# Patient Record
Sex: Male | Born: 1990 | Race: Black or African American | Hispanic: No | Marital: Single | Smoking: Never smoker
Health system: Southern US, Community
[De-identification: ages and names within clinical notes are randomized; demographics above are authoritative.]

## PROBLEM LIST (undated history)

## (undated) DIAGNOSIS — Z21 Asymptomatic human immunodeficiency virus [HIV] infection status: Secondary | ICD-10-CM

## (undated) DIAGNOSIS — F32A Depression, unspecified: Secondary | ICD-10-CM

## (undated) DIAGNOSIS — R41840 Attention and concentration deficit: Secondary | ICD-10-CM

## (undated) DIAGNOSIS — B20 Human immunodeficiency virus [HIV] disease: Secondary | ICD-10-CM

## (undated) DIAGNOSIS — F329 Major depressive disorder, single episode, unspecified: Secondary | ICD-10-CM

## (undated) DIAGNOSIS — A630 Anogenital (venereal) warts: Secondary | ICD-10-CM

## (undated) HISTORY — DX: Anogenital (venereal) warts: A63.0

---
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
## 2010-10-12 ENCOUNTER — Encounter (INDEPENDENT_AMBULATORY_CARE_PROVIDER_SITE_OTHER): Payer: Self-pay | Admitting: General Surgery

## 2010-10-12 ENCOUNTER — Ambulatory Visit (INDEPENDENT_AMBULATORY_CARE_PROVIDER_SITE_OTHER): Payer: BC Managed Care – PPO | Admitting: General Surgery

## 2010-10-12 VITALS — BP 118/76 | HR 80 | Ht 70.0 in | Wt 147.0 lb

## 2010-10-12 DIAGNOSIS — A630 Anogenital (venereal) warts: Secondary | ICD-10-CM | POA: Insufficient documentation

## 2010-10-12 MED ORDER — IMIQUIMOD 5 % EX CREA: TOPICAL_CREAM | CUTANEOUS | Status: DC

## 2010-10-12 NOTE — Progress Notes (Signed)
Chief Complaint  Patient presents with  . Hemorrhoids    HPI Stephen Lee is a 20 y.o. male. HPI  80 year old African American male is self-referred himself for a bleeding perirectal skin tag. He thinks he may have hemorrhoids. He states that he started having problems about a month ago. He describes pain with defecation. The discomfort will last for several hours. It does not feel as if he is passing shards of glass. He will take a hot shower after having a bowel movement which will help to relieve the discomfort. He states that he will have intermittent bleeding. He states that he has a bowel movement every day. He denies having to strain. He denies being a bathroom reader. He denies any incontinence. He states it feels like it will swell up intermittently.  He states that he was tested for HIV and it was negative. He says that he has not been sexually active for many months. He describes himself as bisexual. He denies any history of sexually transmitted diseases.  History reviewed. No pertinent past medical history.  History reviewed. No pertinent past surgical history.  History reviewed. No pertinent family history.  Social History History  Substance Use Topics  . Smoking status: Never Smoker   . Smokeless tobacco: Not on file  . Alcohol Use: No    No Known Allergies  Current Outpatient Prescriptions  Medication Sig Dispense Refill  . imiquimod (ALDARA) 5 % cream Apply topically 3 (three) times a week. Apply to perineum Monday/Wednesday/Friday nights, leave on overnight and watch off in the morning  12 each  3    Review of Systems Review of Systems  Constitutional: Negative for fever, activity change and unexpected weight change.  HENT: Negative for neck pain and neck stiffness.   Eyes: Negative for pain.  Respiratory: Negative for shortness of breath and wheezing.   Cardiovascular: Negative for chest pain.  Gastrointestinal: Negative for abdominal distention.    Genitourinary: Negative for dysuria, hematuria, discharge, difficulty urinating and genital sores.  Musculoskeletal: Negative for back pain and joint swelling.  Skin: Negative.   Neurological: Negative.   Hematological: Negative.   Psychiatric/Behavioral: Negative.     Blood pressure 118/76, pulse 80, height 5\' 10"  (1.778 m), weight 147 lb (66.679 kg).  Physical Exam Physical Exam  Vitals reviewed. Constitutional: He is oriented to person, place, and time. He appears well-developed and well-nourished.  HENT:  Head: Normocephalic and atraumatic.  Eyes: Conjunctivae are normal. No scleral icterus.  Neck: Normal range of motion. Neck supple. No thyromegaly present.  Cardiovascular: Normal rate, regular rhythm and normal heart sounds.   Pulmonary/Chest: Effort normal and breath sounds normal. He has no wheezes.  Abdominal: Soft. Bowel sounds are normal. He exhibits no distension. There is no tenderness.  Genitourinary: Rectal exam shows no external hemorrhoid, no fissure and anal tone normal.       +anal warts, mainly left sided, large, about 4cm long; +anal canal warts on anoscopy - circumferential  Musculoskeletal: Normal range of motion.  Lymphadenopathy:    He has no cervical adenopathy.  Neurological: He is alert and oriented to person, place, and time. He exhibits normal muscle tone.  Skin: Skin is warm and dry.  Psychiatric: He has a normal mood and affect. His behavior is normal. Judgment and thought content normal.    Data Reviewed  Assessment    Condyloma acuminata- perianal & anal canal    Plan    We discussed the etiology of his anal warts. I discussed  the nature of the HPV virus. The patient was given Agricultural engineer. We discussed both nonoperative and operative treatment. My recommendation was to start with topical treatment first in order to hopefully decrease his burden of anal warts. We are going to use Aldara 5% ointment 3 times a week at night for 6  weeks. I will see him back in 6 weeks to see his response to be Aldara ointment. Regardless he will more than likely need to go to the operating room for an exam under anesthesia to manage the anal canal warts.     Mary Sella. Andrey Campanile, MD   Atilano Ina 10/12/2010, 9:26 AM

## 2010-10-12 NOTE — Patient Instructions (Signed)
Warts Genital warts are caused by a virus called Human Papilloma Virus (HPV). HPV is the most common sexually transmitted disease, STD, and infection of the sex organs. There are about 100 different types of the HPV's and 4 of these can cause cancer of the cervix. Ninety percent of genital warts are caused by the HPV.  CAUSES This infection is spread by unprotected sex with an infected person. It can be spread by vaginal, anal, and oral sex. Many people will not know they are infected. They may be infected for years with little or no problems (symptoms). They can still unknowingly pass the infection to sexual partners. The most serious problem is that certain types of HPV is associated with cervical cancer but not genital warts. You are more likely to have HPV if you have other sexually transmitted disease. SYMPTOMS  Itching and irritation in the genital area.   Warts that bleed.   Painful sexual intercourse due to warts.  DIAGNOSIS  Warts are usually recognized with the naked eye in the vagina, on the vulva, the perineum, the anus and in the rectum.   With a Pap test.   With a biopsy.   With colposcopy (an instrument that magnifies the HPV cells that change color with certain solutions).  TREATMENT Warts can be removed by applying certain chemicals to the warts such as:  Podophllin.   Bichloroacedic acid.   Trichloroacedic acid.  Other treatments include:  Interferon injections.   Freezing (cryotherapy).   Laser treatment.   Removal of warts by burning them with electrified probe (electrocautery).   Surgery.  PREVENTION HPV vaccination can help prevent HPV infections that cause genital warts and that cause cancer of the cervix. It is recommended that the vaccination be given to people between the ages 76 to 58 years old. The vaccine might not work as well or might not work at all if you already have HPV. It should not be given to pregnant women. HOME CARE INSTRUCTIONS  It  is important to follow your caregiver's instructions. The warts will not go away without treatment. Repeat treatments often are needed to get rid of the warts. Even after it appears that the warts are gone, the normal tissue underneath often remains infected.   DO NOT try to treat genital warts with medicine used to treat hand warts. This type of medicine is strong and can burn the skin in the genital area, causing more damage.   WARNING: This infection is contagious. Tell your sexual partner(s) with whom you had sex before treatment that you have genital warts. They may be infected also and need treatment.   WARNING: Avoid sexual contact while being treated. Following treatment the use of condoms will help prevent reinfection. This virus is hard to wipe out. You may always be contagious.   DO NOT touch or scratch the warts. This is a viral infection and may spread to other parts of your body (auto-inoculation).   Women with genital warts should have a cervical cancer check (pap smear) at least once a year. This type of cancer is slow-growing and can be cured if found early. Chances of developing cervical cancer are increased with HPV.   Inform your obstetrician in the event of pregnancy. This virus can be passed to the baby's respiratory tract. Discuss this with your caregiver.   Use a condom during sexual intercourse.   There are over-the-counter anti-itch creams you can use for itching, ask your caregiver before using it.   Ask  your caregiver about HPV vaccination.   Have only one sex partner. Make sure your partner has only one sex partner.  SEEK MEDICAL CARE IF:  The treated skin becomes red, swollen, or painful.   An oral temperature above 102 or higher develops.   You feel generally ill.   You feel little lumps (pimple-like) in and around your genital area.   You are bleeding or have painful sexual intercourse.  MAKE SURE YOU:   Understand these instructions.   Will watch  your condition.   Will get help right away if you are not doing well or get worse.  Document Released: 01/27/2000 Document Re-Released: 07/19/2009 [facility] Patient Information 2011 [place], [place].

## 2010-10-31 ENCOUNTER — Inpatient Hospital Stay (INDEPENDENT_AMBULATORY_CARE_PROVIDER_SITE_OTHER)
Admission: RE | Admit: 2010-10-31 | Discharge: 2010-10-31 | Disposition: A | Discharge status: Home / Self Care | Payer: Self-pay | Source: Ambulatory Visit | Attending: Family Medicine | Admitting: Family Medicine

## 2010-10-31 DIAGNOSIS — I889 Nonspecific lymphadenitis, unspecified: Secondary | ICD-10-CM

## 2010-10-31 LAB — HIV ANTIBODY (ROUTINE TESTING W REFLEX): HIV: NONREACTIVE

## 2010-11-01 LAB — RPR: RPR Ser Ql: NONREACTIVE

## 2010-11-10 ENCOUNTER — Encounter (INDEPENDENT_AMBULATORY_CARE_PROVIDER_SITE_OTHER): Payer: Self-pay | Admitting: General Surgery

## 2010-11-10 ENCOUNTER — Ambulatory Visit (INDEPENDENT_AMBULATORY_CARE_PROVIDER_SITE_OTHER): Payer: BC Managed Care – PPO | Admitting: General Surgery

## 2010-11-10 VITALS — BP 122/82 | HR 72 | Temp 98.1°F | Resp 16 | Ht 70.0 in | Wt 146.6 lb

## 2010-11-10 DIAGNOSIS — A630 Anogenital (venereal) warts: Secondary | ICD-10-CM

## 2010-11-10 NOTE — Patient Instructions (Addendum)
No aspirin or blood thinning products 5 days prior to surgery. Use one fleets enema per rectum the morning of surgery at least 2 hours prior to leaving the house.  

## 2010-11-10 NOTE — Progress Notes (Signed)
Chief Complaint  Patient presents with  . Follow-up    reck condyloma    HPI Stephen Lee is a 20 y.o. male. HPI  44 year old [place] American male who I initially saw on 10/12/10.  He was found to have anal canal & left sided perianal warts.  We instituted Aldara ointment to try to decrease his burden of warts. He comes in today for a recheck.  He states he really no longer has discomfort in his rectal area after he took an antibiotic for several lacerations he received on his extremities after falling down.  He only used the Aldara for about 3 weeks. He desires surgical removal of his warts.  He states that he was tested for HIV and it was negative. He says that he has not been sexually active for many months. He describes himself as bisexual. He denies any history of sexually transmitted diseases.  Past Medical History  Diagnosis Date  . Condyloma acuminatum in male     perianal & anal canal    History reviewed. No pertinent past surgical history.  History reviewed. No pertinent family history.  Social History History  Substance Use Topics  . Smoking status: Never Smoker   . Smokeless tobacco: Not on file  . Alcohol Use: No    No Known Allergies  No current outpatient prescriptions on file.    Review of Systems Review of Systems  Constitutional: Negative for fever, activity change and unexpected weight change.  HENT: Negative for neck pain and neck stiffness.   Eyes: Negative for pain.  Respiratory: Negative for shortness of breath and wheezing.   Cardiovascular: Negative for chest pain.  Gastrointestinal: Negative for abdominal distention.  Genitourinary: Negative for dysuria, hematuria, discharge, difficulty urinating and genital sores.  Musculoskeletal: Negative for back pain and joint swelling.  Skin: Negative.   Neurological: Negative.   Hematological: Negative.   Psychiatric/Behavioral: Negative.     Blood pressure 122/82, pulse 72, temperature 98.1 F  (36.7 C), temperature source Temporal, resp. rate 16, height 5\' 10"  (1.778 m), weight 146 lb 9.6 oz (66.497 kg).  Physical Exam Physical Exam  Vitals reviewed. Constitutional: He is oriented to person, place, and time. He appears well-developed and well-nourished.  HENT:  Head: Normocephalic and atraumatic.  Eyes: Conjunctivae are normal. No scleral icterus.  Neck: Normal range of motion. Neck supple. No thyromegaly present.  Cardiovascular: Normal rate, regular rhythm and normal heart sounds.   Pulmonary/Chest: Effort normal and breath sounds normal. He has no wheezes.  Abdominal: Soft. Bowel sounds are normal. He exhibits no distension. There is no tenderness.  Genitourinary: Rectal exam shows no external hemorrhoid, no fissure and anal tone normal.       +anal warts, mainly left sided, large, about 4cm long; +anal canal warts on anoscopy - circumferential  Musculoskeletal: Normal range of motion.  Lymphadenopathy:    He has no cervical adenopathy.  Neurological: He is alert and oriented to person, place, and time. He exhibits normal muscle tone.  Skin: Skin is warm and dry.  Psychiatric: He has a normal mood and affect. His behavior is normal. Judgment and thought content normal.    Data Reviewed My note from 10/12/10  Assessment    Condyloma acuminata- perianal & anal canal    Plan    The patient was given educational material. We discussed both nonoperative and operative treatment.  The patient would like to proceed to the operating room for excision and fulguration of his perianal and anal canal warts.  We discussed the risk and benefits of surgery including but not limited to bleeding, infection, urinary retention, anal canal narrowing, recurrence of warts, postoperative abscess, and blood clot formation, anesthesia risks, and the typical postoperative course. I explained that this is a very painful and uncomfortable procedure to recuperate from. I stressed to him the  importance of not becoming constipated after surgery. We also discussed using sitz baths, stool softeners, drinking plenty of liquids, taking laxatives as needed, etc.  We will schedule him for an excision and fulguration of perianal and anal canal warts in the near future  Salle Brandle M. Andrey Campanile, MD   Atilano Ina 11/10/2010, 12:33 PM

## 2010-11-17 ENCOUNTER — Encounter (INDEPENDENT_AMBULATORY_CARE_PROVIDER_SITE_OTHER): Payer: BC Managed Care – PPO | Admitting: General Surgery

## 2010-11-20 ENCOUNTER — Other Ambulatory Visit (INDEPENDENT_AMBULATORY_CARE_PROVIDER_SITE_OTHER): Payer: Self-pay | Admitting: General Surgery

## 2010-11-20 ENCOUNTER — Encounter (HOSPITAL_COMMUNITY): Payer: BC Managed Care – PPO

## 2010-11-20 LAB — DIFFERENTIAL
Basophils Absolute: 0 10*3/uL (ref 0.0–0.1)
Basophils Relative: 0 % (ref 0–1)
Eosinophils Absolute: 0.1 10*3/uL (ref 0.0–0.7)
Eosinophils Relative: 2 % (ref 0–5)
Lymphocytes Relative: 42 % (ref 12–46)
Lymphs Abs: 3.1 10*3/uL (ref 0.7–4.0)
Monocytes Absolute: 0.6 10*3/uL (ref 0.1–1.0)
Monocytes Relative: 8 % (ref 3–12)
Neutro Abs: 3.5 10*3/uL (ref 1.7–7.7)
Neutrophils Relative %: 48 % (ref 43–77)

## 2010-11-20 LAB — CBC
HCT: 41.6 % (ref 39.0–52.0)
Hemoglobin: 14 g/dL (ref 13.0–17.0)
MCH: 29.4 pg (ref 26.0–34.0)
MCHC: 33.7 g/dL (ref 30.0–36.0)
MCV: 87.2 fL (ref 78.0–100.0)
Platelets: 273 10*3/uL (ref 150–400)
RBC: 4.77 MIL/uL (ref 4.22–5.81)
RDW: 11.9 % (ref 11.5–15.5)
WBC: 7.3 10*3/uL (ref 4.0–10.5)

## 2010-11-20 LAB — SURGICAL PCR SCREEN
MRSA, PCR: NEGATIVE
Staphylococcus aureus: NEGATIVE

## 2010-11-21 ENCOUNTER — Other Ambulatory Visit (INDEPENDENT_AMBULATORY_CARE_PROVIDER_SITE_OTHER): Payer: Self-pay | Admitting: General Surgery

## 2010-11-21 ENCOUNTER — Ambulatory Visit (HOSPITAL_COMMUNITY)
Admission: RE | Admit: 2010-11-21 | Discharge: 2010-11-21 | Disposition: A | Discharge status: Home / Self Care | Payer: BC Managed Care – PPO | Source: Ambulatory Visit | Attending: General Surgery | Admitting: General Surgery

## 2010-11-21 DIAGNOSIS — Z01812 Encounter for preprocedural laboratory examination: Secondary | ICD-10-CM | POA: Insufficient documentation

## 2010-11-21 DIAGNOSIS — A630 Anogenital (venereal) warts: Secondary | ICD-10-CM | POA: Insufficient documentation

## 2010-11-21 DIAGNOSIS — K6282 Dysplasia of anus: Secondary | ICD-10-CM | POA: Insufficient documentation

## 2010-11-28 NOTE — Op Note (Signed)
Stephen Lee, Stephen Lee               ACCOUNT NO.:  [id number]  MEDICAL RECORD NO.:  [id number]  LOCATION:  DAYL                         FACILITY:  [facility]  PHYSICIAN:  Mary Sella. Andrey Campanile, MD     DATE OF BIRTH:  1990/03/17  DATE OF PROCEDURE:  11/21/2010 DATE OF DISCHARGE:  11/21/2010                              OPERATIVE REPORT   PREOPERATIVE DIAGNOSIS:  Perianal and anal canal condyloma acuminata.  POSTOPERATIVE DIAGNOSIS:  Perianal and anal canal condyloma acuminata.  PROCEDURE:  Excision and fulguration of perianal and anal canal condyloma acuminata.  SURGEON:  Mary Sella. Andrey Campanile, MD  ASSISTANT SURGEON:  None.  ANESTHESIA:  General plus 15 cc of 0.25% Marcaine plus 30 cc of Exparel.  SPECIMEN: 1. Perianal warts. 2. Anal canal warts.  EBL:  Less than 30 cc.  FINDINGS:  The patient had mainly left-sided perianal warts in the posterolateral position that were on a fairly large stalk.  He had a few tiny scattered areas on the right side of his anoderm.  With respect to his anal canal, he had circumferential patchy anal canal warts.  There were normal areas of mucosa, but essentially areas of circumferential patchy pattern of anal canal warts.  INDICATIONS FOR PROCEDURE:  The patient is a 20 year old African- American male who has had several-month history of warts.  He stated that he has been tested for HIV and was negative.  We instituted Aldara ointment to try to decrease the burden of his perianal warts to minimize the amount of what we have to do at the time of surgery.  He came back for followup and his warts are not decreased.  The patient elected for surgical removal.  We discussed the risks and benefits at length including, but not limited to bleeding, infection, urinary retention, blood clot formation, recurrence of warts, severe postoperative pain, postoperative narrowing of the anal canal, and pelvic sepsis.  The patient elected to proceed to the operating  room.  DESCRIPTION OF PROCEDURE:  After obtaining informed consent, the patient was brought to the operating room.  Sequential compression devices were placed.  General endotracheal anesthesia was established.  He was then placed in the prone jackknife position with appropriate padding.  His buttocks were taped apart.  His perineum and anus were prepped with Betadine.  He received antibiotics prior to skin incision.  A surgical time-out was performed.  He had pedunculated left-sided perianal warts.  He had a few tiny 1 to 2- mm warts on the right side of his anoderm.  I injected local consisting of 0.25% Marcaine with epinephrine just underneath the mucosa to help with hemostasis.  I then lifted up on the warts and excised them sharply with a 15 blade.  I then cauterized the base of the wart stalk with Bovie electrocautery and then rubbed it off with a sponge and then re- cauterized the base.  This was done on the left side of the perianal area, and these warts were labeled as left-sided perianal warts.  This was done in its entirety for the perianal area.  Digital rectal exam was then performed and an anoscopy was then performed.  He had patchy anal canal warts.  I then injected 0.25% Marcaine with epinephrine directly underneath the warts into the mucosa to help with hemostasis.  I excised some with the Harmonic Scalpel to send to Pathology.  I then burned the base with electrocautery.  For the remaining circumferential patchy anal canal warts, I took the Bovie electrocautery and burned the warts until they have been vaporized and then taking a Ray-Tec, debrided it roughly with the Ray-Tec.  I then re-cauterized the base to help with hemostasis and this was done in a circumferential manner where there were obvious warts.  There were patchy areas of normal mucosa that were left unviolated.  There was no evidence of injury to the underlying muscular sphincters.  I then irrigated the  anal canal with diluted Betadine.  I then injected 30 cc of Exparel around the perianal area as well as regional block.  A piece of Gelfoam was inserted into the rectum followed by ABDs and fluffs on the perineum, and mesh underwear.  The patient was then placed in the supine position, extubated, and taken to Recovery in stable condition.  All needle, instrument, sponge counts were correct x2.  There were no immediate complications.  The patient tolerated the procedure well.     Mary Sella. Andrey Campanile, MD     EMW/MEDQ  D:  11/21/2010  T:  11/21/2010  Job:  [id number]  Electronically Signed by Gaynelle Adu M.D. on 11/28/2010 04:44:39 PM

## 2011-05-24 ENCOUNTER — Emergency Department (INDEPENDENT_AMBULATORY_CARE_PROVIDER_SITE_OTHER)
Admission: EM | Admit: 2011-05-24 | Discharge: 2011-05-24 | Disposition: A | Discharge status: Home / Self Care | Payer: Self-pay | Source: Home / Self Care | Attending: Emergency Medicine | Admitting: Emergency Medicine

## 2011-05-24 ENCOUNTER — Encounter (HOSPITAL_COMMUNITY): Payer: Self-pay | Admitting: Cardiology

## 2011-05-24 DIAGNOSIS — Z9189 Other specified personal risk factors, not elsewhere classified: Secondary | ICD-10-CM

## 2011-05-24 DIAGNOSIS — Z202 Contact with and (suspected) exposure to infections with a predominantly sexual mode of transmission: Secondary | ICD-10-CM

## 2011-05-24 HISTORY — DX: Major depressive disorder, single episode, unspecified: F32.9

## 2011-05-24 HISTORY — DX: Depression, unspecified: F32.A

## 2011-05-24 MED ORDER — AZITHROMYCIN 250 MG PO TABS
1000.0000 mg | ORAL_TABLET | Freq: Once | ORAL | Status: AC
  Administered 2011-05-24: 1000 mg via ORAL

## 2011-05-24 MED ORDER — CEFTRIAXONE SODIUM 250 MG IJ SOLR
INTRAMUSCULAR | Status: AC
  Filled 2011-05-24: qty 250

## 2011-05-24 MED ORDER — CEFTRIAXONE SODIUM 250 MG IJ SOLR
250.0000 mg | Freq: Once | INTRAMUSCULAR | Status: AC
  Administered 2011-05-24: 250 mg via INTRAMUSCULAR

## 2011-05-24 MED ORDER — AZITHROMYCIN 250 MG PO TABS
ORAL_TABLET | ORAL | Status: AC
  Filled 2011-05-24: qty 4

## 2011-05-24 NOTE — ED Notes (Signed)
Pt reports possible exposure to STD. Pt denies drainage from penis but has occasional pinch in his penis. Denies burning or urination. Denies fever.

## 2011-05-24 NOTE — ED Provider Notes (Signed)
History     CSN: [id number]  Arrival date & time 05/24/11  1191   First MD Initiated Contact with Patient 05/24/11 1054      Chief Complaint  Patient presents with  . SEXUALLY TRANSMITTED DISEASE    (Consider location/radiation/quality/duration/timing/severity/associated sxs/prior treatment) HPI Comments: Pt with sharp, intermittent pain in his penis after he urinates for several days and.  No urgency, frequency, dysuria, oderous urine, hematuria,  genital blisters, penile itching. No fevers, N/V, abd pain, back pain. No recent abx use. Pt sexually active with new male partner who is asxatic. does not use condoms. STD's a concern today. No h/o gonorrhea chlamydia trichmonoas. No h/o syphilis, herpes, HIV.    Patient is a 21 y.o. male presenting with STD exposure. The history is provided by the patient. No language interpreter was used.  Exposure to STD This is a new problem. The current episode started more than 2 days ago. Pertinent negatives include no abdominal pain.    Past Medical History  Diagnosis Date  . Condyloma acuminatum in male     perianal & anal canal  . Depression     History reviewed. No pertinent past surgical history.  History reviewed. No pertinent family history.  History  Substance Use Topics  . Smoking status: Never Smoker   . Smokeless tobacco: Not on file  . Alcohol Use: No      Review of Systems  Constitutional: Negative for fever.  HENT: Negative for sore throat.   Gastrointestinal: Negative for nausea, vomiting and abdominal pain.  Genitourinary: Negative for dysuria, urgency, frequency, hematuria, discharge, penile swelling, scrotal swelling, genital sores, penile pain and testicular pain.  Musculoskeletal: Negative for arthralgias.  Skin: Negative for rash.  Neurological: Negative for weakness.    Allergies  Review of patient's allergies indicates no known allergies.  Home Medications   Current Outpatient Rx  Name Route Sig  Dispense Refill  . ARIPIPRAZOLE 5 MG PO TABS Oral Take 5 mg by mouth daily.    Marland Kitchen PRESCRIPTION MEDICATION  Medication of depression. Unsure of name      BP 117/75  Pulse 80  Temp(Src) 97.8 F (36.6 C) (Oral)  Resp 14  SpO2 99%  Physical Exam  Nursing note and vitals reviewed. Constitutional: He is oriented to person, place, and time. He appears well-developed and well-nourished.  HENT:  Head: Normocephalic and atraumatic.  Eyes: Conjunctivae and EOM are normal.  Neck: Normal range of motion.  Cardiovascular: Regular rhythm.   Pulmonary/Chest: Effort normal. No respiratory distress.  Abdominal: He exhibits no distension.  Genitourinary: Testes normal and penis normal. Circumcised. No penile erythema or penile tenderness. No discharge found.       no d/c. no rash. Pt declined chaperone.   Musculoskeletal: Normal range of motion. He exhibits no edema and no tenderness.  Lymphadenopathy:       Right: No inguinal adenopathy present.       Left: No inguinal adenopathy present.  Neurological: He is alert and oriented to person, place, and time.  Skin: Skin is warm and dry. No rash noted.  Psychiatric: He has a normal mood and affect. His behavior is normal.    ED Course  Procedures (including critical care time)  Labs Reviewed - No data to display No results found.   1. Potential exposure to STD     MDM  Previous records reviewed. HIV, RPR neg 10/2010.   H&P most c/w STD exposure. Exam unremarkable.. Sent off GC/chlamydia. . Will treat empirically now.  Giving ceftriaxone 250 mg IM/azithro 1 gm po.  Advised pt to refrain from sexual contact until he knows lab results, symptoms resolve, and partner(s) are treated. Pt provided working phone number. Pt agrees.     Luiz Blare, MD 05/24/11 1236

## 2011-05-24 NOTE — Discharge Instructions (Signed)
Take the medication as written. Give us a working phone number so that we can contact you if needed. Refrain from sexual contact until you know your results and your partner(s) are treated. Return if you get worse, have a fever >100.4, or for any concerns.  ° °Go to www.goodrx.com to look up your medications. This will give you a list of where you can find your prescriptions at the most affordable prices.  °

## 2011-05-25 LAB — GC/CHLAMYDIA PROBE AMP, GENITAL
Chlamydia, DNA Probe: NEGATIVE
GC Probe Amp, Genital: NEGATIVE

## 2011-05-29 ENCOUNTER — Telehealth (HOSPITAL_COMMUNITY): Payer: Self-pay | Admitting: *Deleted

## 2011-05-29 NOTE — ED Notes (Addendum)
4/12 Pt. called on VM @ 0910 and said he has questions about the medicines he received. I called back and left a message to call @ 1512. 4/16 I called and spoke to pt. he said he did call back on Fri. night but no one answered.  I told him I did not have another VM's from him. He said he called back over the weekend and spoke to [place], but she gave him " the runaround."  I asked what his question was?  He wanted to ask when he would get better because he still had symptoms. I verified he got Rocephin and Zithromax.  He said the symptoms just cleared up yesterday. I told him I usually tell pt.'s no sex for 1 week after the medications and then to practice safe sex and always wear a condom. I checked his labs and the GC/Chlamydia were negative. I told him if the symptoms come back to be rechecked. Pt. voiced understanding. Cherly Anderson M.td

## 2011-06-08 ENCOUNTER — Encounter (HOSPITAL_BASED_OUTPATIENT_CLINIC_OR_DEPARTMENT_OTHER): Payer: Self-pay | Admitting: *Deleted

## 2011-06-08 ENCOUNTER — Emergency Department (HOSPITAL_BASED_OUTPATIENT_CLINIC_OR_DEPARTMENT_OTHER)
Admission: EM | Admit: 2011-06-08 | Discharge: 2011-06-08 | Disposition: A | Discharge status: Home / Self Care | Payer: No Typology Code available for payment source | Attending: Emergency Medicine | Admitting: Emergency Medicine

## 2011-06-08 DIAGNOSIS — IMO0002 Reserved for concepts with insufficient information to code with codable children: Secondary | ICD-10-CM | POA: Insufficient documentation

## 2011-06-08 DIAGNOSIS — F3289 Other specified depressive episodes: Secondary | ICD-10-CM | POA: Insufficient documentation

## 2011-06-08 DIAGNOSIS — T148XXA Other injury of unspecified body region, initial encounter: Secondary | ICD-10-CM

## 2011-06-08 DIAGNOSIS — F329 Major depressive disorder, single episode, unspecified: Secondary | ICD-10-CM | POA: Insufficient documentation

## 2011-06-08 MED ORDER — IBUPROFEN 800 MG PO TABS: 800.0000 mg | ORAL_TABLET | Freq: Three times a day (TID) | ORAL | Status: AC

## 2011-06-08 MED ORDER — HYDROCODONE-ACETAMINOPHEN 5-500 MG PO TABS: 1.0000 | ORAL_TABLET | Freq: Four times a day (QID) | ORAL | Status: AC | PRN
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
## 2011-06-10 NOTE — ED Provider Notes (Signed)
Medical screening examination/treatment/procedure(s) were performed by non-physician practitioner and as supervising physician I was immediately available for consultation/collaboration.  Laster Appling, MD 06/10/11 1008 

## 2011-06-27 ENCOUNTER — Emergency Department (HOSPITAL_BASED_OUTPATIENT_CLINIC_OR_DEPARTMENT_OTHER): Payer: Self-pay

## 2011-06-27 ENCOUNTER — Emergency Department (HOSPITAL_BASED_OUTPATIENT_CLINIC_OR_DEPARTMENT_OTHER)
Admission: EM | Admit: 2011-06-27 | Discharge: 2011-06-27 | Disposition: A | Discharge status: Home / Self Care | Payer: Self-pay | Attending: Emergency Medicine | Admitting: Emergency Medicine

## 2011-06-27 ENCOUNTER — Encounter (HOSPITAL_BASED_OUTPATIENT_CLINIC_OR_DEPARTMENT_OTHER): Payer: Self-pay | Admitting: *Deleted

## 2011-06-27 DIAGNOSIS — M25569 Pain in unspecified knee: Secondary | ICD-10-CM | POA: Insufficient documentation

## 2011-06-27 DIAGNOSIS — Z79899 Other long term (current) drug therapy: Secondary | ICD-10-CM | POA: Insufficient documentation

## 2011-06-27 DIAGNOSIS — F329 Major depressive disorder, single episode, unspecified: Secondary | ICD-10-CM | POA: Insufficient documentation

## 2011-06-27 DIAGNOSIS — F3289 Other specified depressive episodes: Secondary | ICD-10-CM | POA: Insufficient documentation

## 2011-06-27 HISTORY — DX: Attention and concentration deficit: R41.840

## 2011-06-27 MED ORDER — IBUPROFEN 800 MG PO TABS
800.0000 mg | ORAL_TABLET | Freq: Once | ORAL | Status: AC
  Administered 2011-06-27: 800 mg via ORAL
  Filled 2011-06-27: qty 1

## 2011-06-27 MED ORDER — IBUPROFEN 800 MG PO TABS: 800.0000 mg | ORAL_TABLET | Freq: Three times a day (TID) | ORAL | Status: AC | PRN
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
## 2011-06-27 NOTE — ED Notes (Signed)
Pt amb to room 2 with quick steady gait in nad. Pt reports bilateral knee pain x several years. Denies any injury or trauma.

## 2011-06-27 NOTE — ED Provider Notes (Signed)
History     CSN: [id number]  Arrival date & time 06/27/11  4132   First MD Initiated Contact with Patient 06/27/11 0719      No chief complaint on file.   (Consider location/radiation/quality/duration/timing/severity/associated sxs/prior treatment) Patient is a 21 y.o. male presenting with knee pain. The history is provided by the patient.  Knee Pain This is a chronic problem. Episode onset: Has been present for 6-7 years it comes and goes. It has been worse over the last few day. The problem occurs every several days. The problem has not changed since onset.Pertinent negatives include no shortness of breath. Associated symptoms comments: No fever, rash, joint swelling, trauma. The symptoms are aggravated by standing. The symptoms are relieved by rest and lying down. He has tried nothing for the symptoms. The treatment provided no relief.    Past Medical History  Diagnosis Date  . Condyloma acuminatum in male     perianal & anal canal  . Depression     No past surgical history on file.  No family history on file.  History  Substance Use Topics  . Smoking status: Never Smoker   . Smokeless tobacco: Not on file  . Alcohol Use: No      Review of Systems  Respiratory: Negative for shortness of breath.   All other systems reviewed and are negative.    Allergies  Review of patient's allergies indicates no known allergies.  Home Medications   Current Outpatient Rx  Name Route Sig Dispense Refill  . ARIPIPRAZOLE 5 MG PO TABS Oral Take 5 mg by mouth daily.    Marland Kitchen PRESCRIPTION MEDICATION  Medication of depression. Unsure of name      BP 119/77  Pulse 74  Temp(Src) 98.1 F (36.7 C) (Oral)  Resp 16  Ht 5\' 11"  (1.803 m)  Wt 162 lb (73.483 kg)  BMI 22.59 kg/m2  SpO2 99%  Physical Exam  Nursing note and vitals reviewed. Constitutional: He is oriented to person, place, and time. He appears well-developed and well-nourished. No distress.  HENT:  Head: Normocephalic  and atraumatic.  Eyes: EOM are normal. Pupils are equal, round, and reactive to light.  Musculoskeletal:       Right knee: Normal. He exhibits normal range of motion, no swelling, no effusion, no erythema, normal alignment and no bony tenderness. no tenderness found.       Left knee: Normal. He exhibits normal range of motion, no swelling, no effusion, no erythema and no bony tenderness. no tenderness found. No medial joint line and no lateral joint line tenderness noted.  Neurological: He is alert and oriented to person, place, and time. He has normal strength. No sensory deficit.  Skin: Skin is warm and dry. No rash noted. No erythema.    ED Course  Procedures (including critical care time)  Labs Reviewed - No data to display Dg Knee 1-2 Views Right  06/27/2011  *RADIOLOGY REPORT*  Clinical Data: Bilateral knee pain for several years.  No known injury.  RIGHT KNEE - 1-2 VIEW  Comparison:  None.  Findings:  There is no evidence of fracture, dislocation, or joint effusion.  There is no evidence of arthropathy or other focal bone abnormality.  Soft tissues are unremarkable.  IMPRESSION: Negative.  Original Report Authenticated By: Elsie Stain, M.D.     1. Knee pain       MDM   Patient presents due to bilateral knee pain the right worse than the left. He states he's  had knee pain since the 21 years old which comes and goes but he states that it woke him from sleep last night due to hurting so badly. The patient has not taken any medication for the pain but it is worse with standing. On exam he has no swelling, erythema or infectious symptoms noted. Bilateral knees can be completely ranged without pain. He states the only pain is with standing. Denies any back pain. Symptoms do not appear to be radicular in nature. There are no signs of septic joint or reactive arthritis. 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 Feel that most likely is  irritation and inflammation for prolonged standing. Patient given ibuprofen and plain films ordered of the right knee.   7:52 AM Films are within normal limits. Discussed with patient and will give him referral if his pain continues.   Gwyneth Sprout, MD 06/27/11 [id number]

## 2011-10-14 ENCOUNTER — Encounter (HOSPITAL_COMMUNITY): Payer: Self-pay | Admitting: *Deleted

## 2011-10-14 ENCOUNTER — Emergency Department (HOSPITAL_COMMUNITY)
Admission: EM | Admit: 2011-10-14 | Discharge: 2011-10-14 | Disposition: A | Discharge status: Home / Self Care | Payer: Self-pay | Source: Home / Self Care | Attending: Family Medicine | Admitting: Family Medicine

## 2011-10-14 DIAGNOSIS — L049 Acute lymphadenitis, unspecified: Secondary | ICD-10-CM

## 2011-10-14 MED ORDER — DOXYCYCLINE HYCLATE 100 MG PO CAPS: 100.0000 mg | ORAL_CAPSULE | Freq: Two times a day (BID) | ORAL | Status: AC

## 2011-10-14 NOTE — ED Notes (Signed)
Pt with c/o left groin tightness - no other c/o - temp 100.3 oral

## 2011-10-14 NOTE — ED Provider Notes (Signed)
History     CSN: [id number]  Arrival date & time 10/14/11  1249   First MD Initiated Contact with Patient 10/14/11 1300      Chief Complaint  Patient presents with  . Groin Swelling    (Consider location/radiation/quality/duration/timing/severity/associated sxs/prior treatment) Patient is a 21 y.o. male presenting with groin pain. The history is provided by the patient.  Groin Pain This is a new problem. The current episode started more than 1 week ago. The problem has not changed since onset.   Past Medical History  Diagnosis Date  . Condyloma acuminatum in male     perianal & anal canal  . Depression   . Attention deficit     History reviewed. No pertinent past surgical history.  History reviewed. No pertinent family history.  History  Substance Use Topics  . Smoking status: Never Smoker   . Smokeless tobacco: Not on file  . Alcohol Use: No      Review of Systems  Constitutional: Negative.   Gastrointestinal: Negative.   Genitourinary: Negative.   Musculoskeletal: Negative.   Hematological: Positive for adenopathy.    Allergies  Review of patient's allergies indicates no known allergies.  Home Medications   Current Outpatient Rx  Name Route Sig Dispense Refill  . ARIPIPRAZOLE 5 MG PO TABS Oral Take 5 mg by mouth daily.    Marland Kitchen DOXYCYCLINE HYCLATE 100 MG PO CAPS Oral Take 1 capsule (100 mg total) by mouth 2 (two) times daily. 20 capsule 0  . PRESCRIPTION MEDICATION  Medication of depression. Unsure of name      BP 113/78  Pulse 97  Temp 100.2 F (37.9 C) (Oral)  Resp 18  SpO2 97%  Physical Exam  Nursing note and vitals reviewed. Constitutional: He is oriented to person, place, and time. He appears well-developed and well-nourished.  Abdominal: Soft. Bowel sounds are normal.  Genitourinary:       Benign lymphadenopathy, bilat inguinal area  Neurological: He is alert and oriented to person, place, and time.  Skin: Skin is warm and dry.    ED  Course  Procedures (including critical care time)  Labs Reviewed - No data to display No results found.   1. Lymphadenitis, acute       MDM          Linna Hoff, MD 10/14/11 1729

## 2012-09-08 ENCOUNTER — Emergency Department (HOSPITAL_COMMUNITY)
Admission: EM | Admit: 2012-09-08 | Discharge: 2012-09-08 | Disposition: A | Discharge status: Home / Self Care | Payer: Self-pay | Attending: Emergency Medicine | Admitting: Emergency Medicine

## 2012-09-08 ENCOUNTER — Encounter (HOSPITAL_COMMUNITY): Payer: Self-pay

## 2012-09-08 ENCOUNTER — Emergency Department (HOSPITAL_COMMUNITY): Payer: Self-pay

## 2012-09-08 DIAGNOSIS — S42309A Unspecified fracture of shaft of humerus, unspecified arm, initial encounter for closed fracture: Secondary | ICD-10-CM | POA: Insufficient documentation

## 2012-09-08 DIAGNOSIS — Z8619 Personal history of other infectious and parasitic diseases: Secondary | ICD-10-CM | POA: Insufficient documentation

## 2012-09-08 DIAGNOSIS — Z8659 Personal history of other mental and behavioral disorders: Secondary | ICD-10-CM | POA: Insufficient documentation

## 2012-09-08 DIAGNOSIS — Z79899 Other long term (current) drug therapy: Secondary | ICD-10-CM | POA: Insufficient documentation

## 2012-09-08 DIAGNOSIS — W03XXXA Other fall on same level due to collision with another person, initial encounter: Secondary | ICD-10-CM

## 2012-09-08 DIAGNOSIS — F3289 Other specified depressive episodes: Secondary | ICD-10-CM | POA: Insufficient documentation

## 2012-09-08 DIAGNOSIS — F329 Major depressive disorder, single episode, unspecified: Secondary | ICD-10-CM | POA: Insufficient documentation

## 2012-09-08 DIAGNOSIS — S42301A Unspecified fracture of shaft of humerus, right arm, initial encounter for closed fracture: Secondary | ICD-10-CM

## 2012-09-08 MED ORDER — OXYCODONE-ACETAMINOPHEN 5-325 MG PO TABS: 1.0000 | ORAL_TABLET | ORAL | Status: DC | PRN

## 2012-09-08 MED ORDER — HYDROMORPHONE HCL PF 1 MG/ML IJ SOLN
1.0000 mg | Freq: Once | INTRAMUSCULAR | Status: AC
  Administered 2012-09-08: 1 mg via INTRAVENOUS
  Filled 2012-09-08: qty 1

## 2012-09-08 MED ORDER — ONDANSETRON HCL 4 MG/2ML IJ SOLN
4.0000 mg | Freq: Once | INTRAMUSCULAR | Status: AC
  Administered 2012-09-08: 4 mg via INTRAVENOUS
  Filled 2012-09-08: qty 2
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
## 2014-07-08 ENCOUNTER — Encounter (HOSPITAL_BASED_OUTPATIENT_CLINIC_OR_DEPARTMENT_OTHER): Payer: Self-pay | Admitting: *Deleted

## 2014-07-08 ENCOUNTER — Emergency Department (HOSPITAL_BASED_OUTPATIENT_CLINIC_OR_DEPARTMENT_OTHER)
Admission: EM | Admit: 2014-07-08 | Discharge: 2014-07-08 | Disposition: A | Discharge status: Home / Self Care | Payer: Self-pay | Attending: Emergency Medicine | Admitting: Emergency Medicine

## 2014-07-08 DIAGNOSIS — Z8619 Personal history of other infectious and parasitic diseases: Secondary | ICD-10-CM | POA: Insufficient documentation

## 2014-07-08 DIAGNOSIS — Z202 Contact with and (suspected) exposure to infections with a predominantly sexual mode of transmission: Secondary | ICD-10-CM | POA: Insufficient documentation

## 2014-07-08 DIAGNOSIS — Z8659 Personal history of other mental and behavioral disorders: Secondary | ICD-10-CM | POA: Insufficient documentation

## 2014-07-08 DIAGNOSIS — R300 Dysuria: Secondary | ICD-10-CM | POA: Insufficient documentation

## 2014-07-08 MED ORDER — CEFTRIAXONE SODIUM 250 MG IJ SOLR
250.0000 mg | Freq: Once | INTRAMUSCULAR | Status: AC
  Administered 2014-07-08: 250 mg via INTRAMUSCULAR
  Filled 2014-07-08: qty 250

## 2014-07-08 MED ORDER — AZITHROMYCIN 250 MG PO TABS
1000.0000 mg | ORAL_TABLET | Freq: Once | ORAL | Status: AC
  Administered 2014-07-08: 1000 mg via ORAL
  Filled 2014-07-08: qty 4

## 2014-07-08 NOTE — ED Provider Notes (Signed)
CSN: [id number]     Arrival date & time 07/08/14  1736 History   First MD Initiated Contact with Patient 07/08/14 1742     Chief Complaint  Patient presents with  . Exposure to STD     (Consider location/radiation/quality/duration/timing/severity/associated sxs/prior Treatment) HPI Comments: Patient presents with concerns over having a possible STD. He reports a new sexual contact and describes burning with urination and itching to his urethra.  Patient is a 24 y.o. male presenting with STD exposure. The history is provided by the patient.  Exposure to STD This is a new problem. The current episode started 2 days ago. The problem occurs constantly. The problem has been gradually worsening. Exacerbated by: Urinating. Nothing relieves the symptoms. He has tried nothing for the symptoms. The treatment provided no relief.    Past Medical History  Diagnosis Date  . Condyloma acuminatum in male     perianal & anal canal  . Depression   . Attention deficit    History reviewed. No pertinent past surgical history. History reviewed. No pertinent family history. History  Substance Use Topics  . Smoking status: Never Smoker   . Smokeless tobacco: Not on file  . Alcohol Use: No    Review of Systems  All other systems reviewed and are negative.     Allergies  Review of patient's allergies indicates no known allergies.  Home Medications   Prior to Admission medications   Not on File   BP 129/74 mmHg  Pulse 86  Temp(Src) 98.5 F (36.9 C)  Resp 18  Ht 5\' 11"  (1.803 m)  Wt 175 lb (79.379 kg)  BMI 24.42 kg/m2  SpO2 96% Physical Exam  Constitutional: He is oriented to person, place, and time. He appears well-developed and well-nourished. No distress.  HENT:  Head: Normocephalic and atraumatic.  Neck: Normal range of motion. Neck supple.  Genitourinary: Penis normal.  There is a slight urethral discharge present.  Neurological: He is alert and oriented to person, place, and  time.  Skin: Skin is warm. He is not diaphoretic.  Nursing note and vitals reviewed.   ED Course  Procedures (including critical care time) Labs Review Labs Reviewed  GC/CHLAMYDIA PROBE AMP ([place]    Imaging Review No results found.   EKG Interpretation None      MDM   Final diagnoses:  None    Will treat presumptively for GC and Chlamydia pending culture results.    Geoffery Lyonsouglas Jancarlos Thrun, MD 07/08/14 [phone]

## 2014-07-08 NOTE — Discharge Instructions (Signed)
We will call you if your cultures indicate you require further treatment.   Dysuria Dysuria is the medical term for pain with urination. There are many causes for dysuria, but urinary tract infection is the most common. If a urinalysis was performed it can show that there is a urinary tract infection. A urine culture confirms that you or your child is sick. You will need to follow up with a healthcare provider because:  If a urine culture was done you will need to know the culture results and treatment recommendations.  If the urine culture was positive, you or your child will need to be put on antibiotics or know if the antibiotics prescribed are the right antibiotics for your urinary tract infection.  If the urine culture is negative (no urinary tract infection), then other causes may need to be explored or antibiotics need to be stopped. Today laboratory work may have been done and there does not seem to be an infection. If cultures were done they will take at least 24 to 48 hours to be completed. Today x-rays may have been taken and they read as normal. No cause can be found for the problems. The x-rays may be re-read by a radiologist and you will be contacted if additional findings are made. You or your child may have been put on medications to help with this problem until you can see your primary caregiver. If the problems get better, see your primary caregiver if the problems return. If you were given antibiotics (medications which kill germs), take all of the mediations as directed for the full course of treatment.  If laboratory work was done, you need to find the results. Leave a telephone number where you can be reached. If this is not possible, make sure you find out how you are to get test results. HOME CARE INSTRUCTIONS   Drink lots of fluids. For adults, drink eight, 8 ounce glasses of clear juice or water a day. For children, replace fluids as suggested by your caregiver.  Empty  the bladder often. Avoid holding urine for long periods of time.  After a bowel movement, women should cleanse front to back, using each tissue only once.  Empty your bladder before and after sexual intercourse.  Take all the medicine given to you until it is gone. You may feel better in a few days, but TAKE ALL MEDICINE.  Avoid caffeine, tea, alcohol and carbonated beverages, because they tend to irritate the bladder.  In men, alcohol may irritate the prostate.  Only take over-the-counter or prescription medicines for pain, discomfort, or fever as directed by your caregiver.  If your caregiver has given you a follow-up appointment, it is very important to keep that appointment. Not keeping the appointment could result in a chronic or permanent injury, pain, and disability. If there is any problem keeping the appointment, you must call back to this facility for assistance. SEEK IMMEDIATE MEDICAL CARE IF:   Back pain develops.  A fever develops.  There is nausea (feeling sick to your stomach) or vomiting (throwing up).  Problems are no better with medications or are getting worse. MAKE SURE YOU:   Understand these instructions.  Will watch your condition.  Will get help right away if you are not doing well or get worse. Document Released: 10/28/2003 Document Revised: 04/23/2011 Document Reviewed: 09/04/2007 [facility] Patient Information 2015 [place], [place]. This information is not intended to replace advice given to you by your health care provider. Make sure you  discuss any questions you have with your health care provider. ° °

## 2014-07-08 NOTE — ED Notes (Signed)
Pt c/o penile itching x 1 day denies discharge , requesting std check

## 2014-07-09 ENCOUNTER — Telehealth (HOSPITAL_BASED_OUTPATIENT_CLINIC_OR_DEPARTMENT_OTHER): Payer: Self-pay | Admitting: Emergency Medicine

## 2014-07-09 LAB — GC/CHLAMYDIA PROBE AMP (~~LOC~~) NOT AT ARMC
Chlamydia: NEGATIVE
Neisseria Gonorrhea: POSITIVE — AB

## 2014-07-10 ENCOUNTER — Telehealth (HOSPITAL_BASED_OUTPATIENT_CLINIC_OR_DEPARTMENT_OTHER): Payer: Self-pay | Admitting: Emergency Medicine
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
## 2014-08-15 ENCOUNTER — Emergency Department (HOSPITAL_BASED_OUTPATIENT_CLINIC_OR_DEPARTMENT_OTHER)
Admission: EM | Admit: 2014-08-15 | Discharge: 2014-08-15 | Disposition: A | Discharge status: Home / Self Care | Payer: No Typology Code available for payment source | Attending: Emergency Medicine | Admitting: Emergency Medicine

## 2014-08-15 ENCOUNTER — Encounter (HOSPITAL_BASED_OUTPATIENT_CLINIC_OR_DEPARTMENT_OTHER): Payer: Self-pay | Admitting: *Deleted

## 2014-08-15 DIAGNOSIS — Z8659 Personal history of other mental and behavioral disorders: Secondary | ICD-10-CM | POA: Insufficient documentation

## 2014-08-15 DIAGNOSIS — S0990XA Unspecified injury of head, initial encounter: Secondary | ICD-10-CM | POA: Insufficient documentation

## 2014-08-15 DIAGNOSIS — Y998 Other external cause status: Secondary | ICD-10-CM | POA: Insufficient documentation

## 2014-08-15 DIAGNOSIS — Y92481 Parking lot as the place of occurrence of the external cause: Secondary | ICD-10-CM | POA: Diagnosis not present

## 2014-08-15 DIAGNOSIS — Y9389 Activity, other specified: Secondary | ICD-10-CM | POA: Insufficient documentation

## 2014-08-15 DIAGNOSIS — Z8619 Personal history of other infectious and parasitic diseases: Secondary | ICD-10-CM | POA: Insufficient documentation

## 2014-08-15 MED ORDER — ACETAMINOPHEN 500 MG PO TABS
1000.0000 mg | ORAL_TABLET | Freq: Once | ORAL | Status: AC
  Administered 2014-08-15: 1000 mg via ORAL
  Filled 2014-08-15: qty 2
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
## 2014-08-15 NOTE — ED Notes (Signed)
D/c home- no new rx given 

## 2016-07-01 ENCOUNTER — Encounter (HOSPITAL_BASED_OUTPATIENT_CLINIC_OR_DEPARTMENT_OTHER): Payer: Self-pay | Admitting: *Deleted

## 2016-07-01 ENCOUNTER — Emergency Department (HOSPITAL_BASED_OUTPATIENT_CLINIC_OR_DEPARTMENT_OTHER)
Admission: EM | Admit: 2016-07-01 | Discharge: 2016-07-01 | Disposition: A | Discharge status: Home / Self Care | Payer: Self-pay | Attending: Emergency Medicine | Admitting: Emergency Medicine

## 2016-07-01 DIAGNOSIS — J029 Acute pharyngitis, unspecified: Secondary | ICD-10-CM | POA: Insufficient documentation

## 2016-07-01 DIAGNOSIS — J040 Acute laryngitis: Secondary | ICD-10-CM | POA: Insufficient documentation

## 2016-07-01 LAB — RAPID STREP SCREEN (MED CTR MEBANE ONLY): Streptococcus, Group A Screen (Direct): NEGATIVE

## 2016-07-01 MED ORDER — PREDNISONE 20 MG PO TABS: 40.0000 mg | ORAL_TABLET | Freq: Every day | ORAL | 0 refills | Status: DC

## 2016-07-01 NOTE — ED Provider Notes (Signed)
[facility]-EMERGENCY DEPT Provider Note   CSN: [id number] Arrival date & time: 07/01/16  1555  By signing my name below, I, [name], attest that this documentation has been prepared under the direction and in the presence of Arthor Captain, PA-C. Electronically Signed: Lyndon Code., ED Scribe. 07/03/16. 5:00 PM.   History   Chief Complaint Chief Complaint  Patient presents with  . Sore Throat    HPI Stephen Lee is a 26 y.o. male who presents to the Emergency Department complaining of sore throat with onset x6 days. Pt states that he developed sore throat x6 days ago that has since resolved but states that he awoke this morning and could hardly talk. He reports cough, congestion, myalgia. Pt denies any modifying factors. Pt denies fever, appetite change.   The history is provided by the patient. No language interpreter was used.    Past Medical History:  Diagnosis Date  . Attention deficit   . Condyloma acuminatum in male    perianal & anal canal  . Depression     Patient Active Problem List   Diagnosis Date Noted  . Condyloma acuminatum in male 10/12/2010    History reviewed. No pertinent surgical history.     Home Medications    Prior to Admission medications   Medication Sig Start Date End Date Taking? Authorizing Provider  predniSONE (DELTASONE) 20 MG tablet Take 2 tablets (40 mg total) by mouth daily. 07/01/16   Arthor Captain, PA-C    Family History No family history on file.  Social History Social History  Substance Use Topics  . Smoking status: Never Smoker  . Smokeless tobacco: Never Used  . Alcohol use No     Allergies   Patient has no known allergies.   Review of Systems Review of Systems  Constitutional: Negative for fever.  HENT: Positive for voice change. Negative for sore throat.   Respiratory: Positive for cough.   Musculoskeletal: Positive for myalgias.     Physical Exam Updated Vital Signs BP (!) 132/91 (BP  Location: Left Arm)   Pulse 76   Temp 98 F (36.7 C) (Oral)   Resp 18   Ht 5\' 11"  (1.803 m)   Wt 81.6 kg (180 lb)   SpO2 99%   BMI 25.10 kg/m   Physical Exam  Constitutional: He appears well-developed and well-nourished.  HENT:  Head: Normocephalic and atraumatic.  Mouth/Throat: Posterior oropharyngeal erythema present. No oropharyngeal exudate or posterior oropharyngeal edema.  Mild erythema but no swelling or exudates.   Eyes: Conjunctivae are normal.  Neck: Neck supple.  Cardiovascular: Normal rate, regular rhythm and normal heart sounds.   No murmur heard. Pulmonary/Chest: Effort normal and breath sounds normal. No respiratory distress.  Lungs clear.   Abdominal: Soft. There is no tenderness.  Musculoskeletal: He exhibits no edema.  Lymphadenopathy:    He has cervical adenopathy.  Pt has small amount of lymphadenopathy bilaterally.   Neurological: He is alert.  Skin: Skin is warm and dry.  Psychiatric: He has a normal mood and affect.  Nursing note and vitals reviewed.    ED Treatments / Results   DIAGNOSTIC STUDIES: Oxygen Saturation is 100% on RA, normal by my interpretation.   COORDINATION OF CARE: 5:00 PM-Discussed next steps with pt. Pt verbalized understanding and is agreeable with the plan.    Labs (all labs ordered are listed, but only abnormal results are displayed) Labs Reviewed  RAPID STREP SCREEN (NOT AT [facility])  CULTURE, GROUP A STREP [facility])  EKG  EKG Interpretation None       Radiology No results found.  Procedures Procedures (including critical care time)  Medications Ordered in ED Medications - No data to display   Initial Impression / Assessment and Plan / ED Course  I have reviewed the triage vital signs and the nursing notes.  Pertinent labs & imaging results that were available during my care of the patient were reviewed by me and considered in my medical decision making (see chart for details).     Patient with  laryngitis.  Patients symptoms are consistent with URI, likely viral etiology. Discussed that antibiotics are not indicated for viral infections. Pt will be discharged with symptomatic treatment.  Verbalizes understanding and is agreeable with plan. Pt is hemodynamically stable & in NAD prior to dc.   Final Clinical Impressions(s) / ED Diagnoses   Final diagnoses:  Laryngitis    New Prescriptions Discharge Medication List as of 07/01/2016  6:05 PM    START taking these medications   Details  predniSONE (DELTASONE) 20 MG tablet Take 2 tablets (40 mg total) by mouth daily., Starting Sun 07/01/2016, Print       I personally performed the services described in this documentation, which was scribed in my presence. The recorded information has been reviewed and is accurate.       Arthor CaptainHarris, Demarquez Ciolek, PA-C 07/03/16 1701    Jerelyn ScottLinker, Martha, MD 07/04/16 [phone]

## 2016-07-01 NOTE — ED Triage Notes (Signed)
Pt here for sore throat x 1 week.  Pt felt like he had no voice when he woke up this am.  Laryngitis, pt has hoarseness and his voice breaks a little.  No fever with this

## 2016-07-01 NOTE — Discharge Instructions (Signed)
Get help right away if: You cough up blood. You have trouble breathing

## 2016-07-01 NOTE — ED Notes (Signed)
Pt given d/c instructions as per chart. rx x 1. Verbalizes understanding. No questions. 

## 2016-07-04 LAB — CULTURE, GROUP A STREP (THRC)

## 2016-09-10 ENCOUNTER — Encounter (HOSPITAL_BASED_OUTPATIENT_CLINIC_OR_DEPARTMENT_OTHER): Payer: Self-pay | Admitting: *Deleted

## 2016-09-10 ENCOUNTER — Emergency Department (HOSPITAL_BASED_OUTPATIENT_CLINIC_OR_DEPARTMENT_OTHER)
Admission: EM | Admit: 2016-09-10 | Discharge: 2016-09-10 | Disposition: A | Discharge status: Home / Self Care | Payer: Self-pay | Attending: Emergency Medicine | Admitting: Emergency Medicine

## 2016-09-10 DIAGNOSIS — Z79899 Other long term (current) drug therapy: Secondary | ICD-10-CM | POA: Insufficient documentation

## 2016-09-10 DIAGNOSIS — Z202 Contact with and (suspected) exposure to infections with a predominantly sexual mode of transmission: Secondary | ICD-10-CM | POA: Insufficient documentation

## 2016-09-10 DIAGNOSIS — F909 Attention-deficit hyperactivity disorder, unspecified type: Secondary | ICD-10-CM | POA: Insufficient documentation

## 2016-09-10 MED ORDER — LIDOCAINE HCL (PF) 1 % IJ SOLN
INTRAMUSCULAR | Status: AC
Start: 2016-09-10 — End: 2016-09-10
  Administered 2016-09-10: 0.9 mL
  Filled 2016-09-10: qty 5

## 2016-09-10 MED ORDER — CEFTRIAXONE SODIUM 250 MG IJ SOLR
250.0000 mg | Freq: Once | INTRAMUSCULAR | Status: AC
  Administered 2016-09-10: 250 mg via INTRAMUSCULAR
  Filled 2016-09-10: qty 250

## 2016-09-10 MED ORDER — AZITHROMYCIN 250 MG PO TABS
1000.0000 mg | ORAL_TABLET | Freq: Once | ORAL | Status: AC
  Administered 2016-09-10: 1000 mg via ORAL
  Filled 2016-09-10: qty 4

## 2016-09-10 NOTE — Discharge Instructions (Signed)
Return for any new or worse symptoms. Follow-up if symptoms do not improve.

## 2016-09-10 NOTE — ED Notes (Signed)
Pt verbalizes understanding of d/c instructions and denies any further needs at this time. 

## 2016-09-10 NOTE — ED Provider Notes (Signed)
[facility] Provider [facility]   CSN: [id number] Arrival date & time: 09/10/16  2025 By signing my name below, I, Levon HedgerElizabeth Hall, attest that this documentation has been prepared under the direction and in the presence of Vanetta MuldersZackowski, Kanon Colunga, MD . Electronically Signed: Levon HedgerElizabeth Hall, Scribe. 09/10/2016. 10:22 PM.   History   Chief Complaint Chief Complaint  Patient presents with  . Exposure to STD    HPI Milferd Loleta ChanceHill is a 26 y.o. male who presents to the Emergency Department for evaluation s/p recent exposure to STD. Pt is currently asymptomatic. He denies any penile discharge, genital sores, testicular pain, rash, fever, chills, rhinorrhea, sore throat, cough, visual disturbance, SOB, CP, n/v/d, abdominal pain, dysuria, hematuria, back pain, joint swelling, or headaches.  Pt has no other acute complaints or associated symptoms at this time.    The history is provided by the patient. No language interpreter was used.  Exposure to STD  This is a new problem. Pertinent negatives include no chest pain, no abdominal pain, no headaches and no shortness of breath. Nothing aggravates the symptoms. Nothing relieves the symptoms. He has tried nothing for the symptoms. The treatment provided no relief.    Past Medical History:  Diagnosis Date  . Attention deficit   . Condyloma acuminatum in male    perianal & anal canal  . Depression     Patient Active Problem List   Diagnosis Date Noted  . Condyloma acuminatum in male 10/12/2010    History reviewed. No pertinent surgical history.   Home Medications    Prior to Admission medications   Medication Sig Start Date End Date Taking? Authorizing Provider  predniSONE (DELTASONE) 20 MG tablet Take 2 tablets (40 mg total) by mouth daily. 07/01/16   Arthor CaptainHarris, Abigail, PA-C    Family History No family history on file.  Social History Social History  Substance Use Topics  . Smoking status: Never Smoker  . Smokeless tobacco: Never  Used  . Alcohol use No    Allergies   Patient has no known allergies.   Review of Systems Review of Systems  Constitutional: Negative for chills and fever.  HENT: Negative for rhinorrhea and sore throat.   Eyes: Negative for visual disturbance.  Respiratory: Negative for cough and shortness of breath.   Cardiovascular: Negative for chest pain.  Gastrointestinal: Negative for abdominal pain, diarrhea, nausea and vomiting.  Genitourinary: Negative for discharge, dysuria, genital sores, hematuria and testicular pain.  Musculoskeletal: Negative for back pain.  Skin: Negative for rash.  Neurological: Negative for headaches.  Hematological: Does not bruise/bleed easily.  Psychiatric/Behavioral: Negative for confusion.   Physical Exam Updated Vital Signs BP (!) 152/96 (BP Location: Right Arm)   Pulse 68   Temp 98.6 F (37 C) (Oral)   Resp 18   Ht 1.803 m (5\' 11" )   Wt 81.6 kg (180 lb)   SpO2 100%   BMI 25.10 kg/m   Physical Exam  Constitutional: He is oriented to person, place, and time. He appears well-developed and well-nourished. No distress.  HENT:  Head: Normocephalic.  Mucous membranes are moist.  Eyes: Pupils are equal, round, and reactive to light. Conjunctivae and EOM are normal. No scleral icterus.  Neck: Neck supple.  Cardiovascular: Normal rate and regular rhythm.   Pulmonary/Chest: Effort normal. No respiratory distress. He has no wheezes. He has no rales.  Abdominal: Soft. Bowel sounds are normal. There is no tenderness. Hernia confirmed negative in the right inguinal area and confirmed negative in the  left inguinal area.  Genitourinary: Testes normal and penis normal. Circumcised. No discharge found.  Genitourinary Comments: No lesions. No groin adenopathy.   Musculoskeletal: Normal range of motion. He exhibits no edema.  No ankle swelling.  Neurological: He is alert and oriented to person, place, and time. No cranial nerve deficit or sensory deficit. He  exhibits normal muscle tone. Coordination normal.  Skin: Skin is warm and dry.  Psychiatric: He has a normal mood and affect.  Nursing note and vitals reviewed.  ED Treatments / Results  DIAGNOSTIC STUDIES:  Oxygen Saturation is 100% on RA, normal by my interpretation.    COORDINATION OF CARE:  10:19 PM Discussed treatment plan with pt at bedside and pt agreed to plan.   Labs (all labs ordered are listed, but only abnormal results are displayed) Labs Reviewed - No data to display  EKG  EKG Interpretation None       Radiology No results found.  Procedures Procedures (including critical care time)  Medications Ordered in ED Medications  cefTRIAXone (ROCEPHIN) injection 250 mg (not administered)  azithromycin (ZITHROMAX) tablet 1,000 mg (not administered)     Initial Impression / Assessment and Plan / ED Course  I have reviewed the triage vital signs and the nursing notes.  Pertinent labs & imaging results that were available during my care of the patient were reviewed by me and considered in my medical decision making (see chart for details).    Patient with concern about STD exposure. There is no discharge. Either present today are that he is seen. Patient did not want culture taken. Patient treated here with Rocephin and Zithromax. Exam without evidence of any adenopathy testicular mass or tenderness or skin lesion or discharge.   Final Clinical Impressions(s) / ED Diagnoses   Final diagnoses:  STD exposure    New Prescriptions New Prescriptions   No medications on file   I personally performed the services described in this documentation, which was scribed in my presence. The recorded information has been reviewed and is accurate.      Vanetta MuldersZackowski, Neriyah Cercone, MD 09/10/16 2229

## 2016-09-10 NOTE — ED Triage Notes (Signed)
Penis is tingling. He wants to be checked for an STD.

## 2017-01-10 ENCOUNTER — Encounter (HOSPITAL_BASED_OUTPATIENT_CLINIC_OR_DEPARTMENT_OTHER): Payer: Self-pay | Admitting: *Deleted

## 2017-01-10 ENCOUNTER — Other Ambulatory Visit: Payer: Self-pay

## 2017-01-10 ENCOUNTER — Emergency Department (HOSPITAL_BASED_OUTPATIENT_CLINIC_OR_DEPARTMENT_OTHER)
Admission: EM | Admit: 2017-01-10 | Discharge: 2017-01-10 | Disposition: A | Discharge status: Home / Self Care | Payer: Self-pay | Attending: Emergency Medicine | Admitting: Emergency Medicine

## 2017-01-10 DIAGNOSIS — Z79899 Other long term (current) drug therapy: Secondary | ICD-10-CM | POA: Insufficient documentation

## 2017-01-10 DIAGNOSIS — R197 Diarrhea, unspecified: Secondary | ICD-10-CM | POA: Insufficient documentation

## 2017-01-10 LAB — CBC WITH DIFFERENTIAL/PLATELET
Basophils Absolute: 0 10*3/uL (ref 0.0–0.1)
Basophils Relative: 0 %
Eosinophils Absolute: 0.3 10*3/uL (ref 0.0–0.7)
Eosinophils Relative: 3 %
HCT: 42 % (ref 39.0–52.0)
Hemoglobin: 13.9 g/dL (ref 13.0–17.0)
Lymphocytes Relative: 31 %
Lymphs Abs: 2.8 10*3/uL (ref 0.7–4.0)
MCH: 29.3 pg (ref 26.0–34.0)
MCHC: 33.1 g/dL (ref 30.0–36.0)
MCV: 88.4 fL (ref 78.0–100.0)
Monocytes Absolute: 1 10*3/uL (ref 0.1–1.0)
Monocytes Relative: 11 %
Neutro Abs: 5 10*3/uL (ref 1.7–7.7)
Neutrophils Relative %: 55 %
Platelets: 345 10*3/uL (ref 150–400)
RBC: 4.75 MIL/uL (ref 4.22–5.81)
RDW: 12.9 % (ref 11.5–15.5)
WBC: 9.2 10*3/uL (ref 4.0–10.5)

## 2017-01-10 LAB — COMPREHENSIVE METABOLIC PANEL
ALT: 7 U/L — ABNORMAL LOW (ref 17–63)
AST: 29 U/L (ref 15–41)
Albumin: 4 g/dL (ref 3.5–5.0)
Alkaline Phosphatase: 67 U/L (ref 38–126)
Anion gap: 7 (ref 5–15)
BUN: 10 mg/dL (ref 6–20)
CO2: 26 mmol/L (ref 22–32)
Calcium: 8.9 mg/dL (ref 8.9–10.3)
Chloride: 106 mmol/L (ref 101–111)
Creatinine, Ser: 1.1 mg/dL (ref 0.61–1.24)
GFR calc Af Amer: >60 mL/min (ref >60)
GFR calc non Af Amer: >60 mL/min (ref >60)
Glucose, Bld: 87 mg/dL (ref 65–99)
Potassium: 4.4 mmol/L (ref 3.5–5.1)
Sodium: 139 mmol/L (ref 135–145)
Total Bilirubin: 0.7 mg/dL (ref 0.3–1.2)
Total Protein: 7.3 g/dL (ref 6.5–8.1)

## 2017-01-10 MED ORDER — SODIUM CHLORIDE 0.9 % IV BOLUS (SEPSIS)
1000.0000 mL | Freq: Once | INTRAVENOUS | Status: AC
  Administered 2017-01-10: 1000 mL via INTRAVENOUS

## 2017-01-10 NOTE — ED Provider Notes (Signed)
[facility] EMERGENCY DEPARTMENT Provider Note   CSN: [id number] Arrival date & time: 01/10/17  [id number]     History   Chief Complaint Chief Complaint  Patient presents with  . Diarrhea    HPI Stephen Lee is a 26 y.o. male presenting to the ED with greater than 1 week of diarrhea.  Patient states he has about 2 bowel movements per day, that are loose.  Denies hematochezia or melena, fever, abdominal pain, nausea, urinary symptoms, change in appetite, or other complaints.  No recent travel.  No recent antibiotics. No hx abdominal surgeries.  The history is provided by the patient.    Past Medical History:  Diagnosis Date  . Attention deficit   . Condyloma acuminatum in male    perianal & anal canal  . Depression     Patient Active Problem List   Diagnosis Date Noted  . Condyloma acuminatum in male 10/12/2010    History reviewed. No pertinent surgical history.     Home Medications    Prior to Admission medications   Medication Sig Start Date End Date Taking? Authorizing Provider  predniSONE (DELTASONE) 20 MG tablet Take 2 tablets (40 mg total) by mouth daily. 07/01/16   Arthor CaptainHarris, Abigail, PA-C    Family History No family history on file.  Social History Social History   Tobacco Use  . Smoking status: Never Smoker  . Smokeless tobacco: Never Used  Substance Use Topics  . Alcohol use: No  . Drug use: No     Allergies   Patient has no known allergies.   Review of Systems Review of Systems  Constitutional: Negative for appetite change and fever.  Gastrointestinal: Positive for diarrhea. Negative for abdominal pain, blood in stool, nausea and vomiting.  Genitourinary: Negative for dysuria and frequency.  All other systems reviewed and are negative.    Physical Exam Updated Vital Signs BP 129/85   Pulse 77   Temp 99 F (37.2 C) (Oral)   Resp 16   Ht 5\' 11"  (1.803 m)   Wt 81.6 kg (180 lb)   SpO2 95%   BMI 25.10 kg/m   Physical Exam    Constitutional: He appears well-developed and well-nourished. He does not have a sickly appearance. He does not appear ill. No distress.  HENT:  Head: Normocephalic and atraumatic.  Mouth/Throat: Oropharynx is clear and moist.  Eyes: Conjunctivae are normal.  Cardiovascular: Normal rate, regular rhythm, normal heart sounds and intact distal pulses.  Pulmonary/Chest: Effort normal and breath sounds normal. No respiratory distress.  Abdominal: Soft. Bowel sounds are normal. He exhibits no distension and no mass. There is no tenderness. There is no rebound and no guarding.  Neurological: He is alert.  Skin: Skin is warm.  Psychiatric: He has a normal mood and affect. His behavior is normal.  Nursing note and vitals reviewed.    ED Treatments / Results  Labs (all labs ordered are listed, but only abnormal results are displayed) Labs Reviewed  COMPREHENSIVE METABOLIC PANEL - Abnormal; Notable for the following components:      Result Value   ALT 7 (*)    All other components within normal limits  CBC WITH DIFFERENTIAL/PLATELET  HIV ANTIBODY (ROUTINE TESTING)    EKG  EKG Interpretation None       Radiology No results found.  Procedures Procedures (including critical care time)  Medications Ordered in ED Medications  sodium chloride 0.9 % bolus 1,000 mL (0 mLs Intravenous Stopped 01/10/17 2229)  Initial Impression / Assessment and Plan / ED Course  I have reviewed the triage vital signs and the nursing notes.  Pertinent labs & imaging results that were available during my care of the patient were reviewed by me and considered in my medical decision making (see chart for details).    Patient presenting with greater than 1 week of diarrhea, 1-2 bowel movements per day.  No recent travel or antibiotics.  No hematochezia or melena.  Vital signs stable.  Abdomen without tenderness or pain.  CBC and CMP normal.  HIV pending.  IV fluid bolus given.  Patient is  well-appearing and safe for discharge.  He is aware he has HIV result pending, and will be contacted for positive result.  Precautions discussed.  Safe for discharge at this time.  Patient discussed with Dr. Erma HeritageIsaacs.  Discussed results, findings, treatment and follow up. Patient advised of return precautions. Patient verbalized understanding and agreed with plan.  Final Clinical Impressions(s) / ED Diagnoses   Final diagnoses:  Diarrhea, unspecified type    ED Discharge Orders    None       Lilyan Prete, [place] N, PA-C 01/10/17 2239    Shaune PollackIsaacs, Cameron, MD 01/11/17 [phone]

## 2017-01-10 NOTE — Discharge Instructions (Signed)
Please read instructions below. Drink plenty of water. Take Imodium as needed for diarrhea. You will be contacted by the hospital if your HIV test result comes back  positive. It is important to avoid any sexual activity until you know these results. Follow-up with your primary care provider if symptoms persist. Return to the ER for fever, abdominal pain, or worsening symptoms.

## 2017-01-10 NOTE — ED Triage Notes (Signed)
Diarrhea for a week. Nausea.

## 2017-01-12 LAB — HIV ANTIBODY (ROUTINE TESTING W REFLEX): HIV Screen 4th Generation wRfx: NONREACTIVE

## 2018-03-23 ENCOUNTER — Encounter (HOSPITAL_BASED_OUTPATIENT_CLINIC_OR_DEPARTMENT_OTHER): Payer: Self-pay | Admitting: Emergency Medicine

## 2018-03-23 ENCOUNTER — Other Ambulatory Visit: Payer: Self-pay

## 2018-03-23 ENCOUNTER — Emergency Department (HOSPITAL_BASED_OUTPATIENT_CLINIC_OR_DEPARTMENT_OTHER)
Admission: EM | Admit: 2018-03-23 | Discharge: 2018-03-23 | Disposition: A | Discharge status: Home / Self Care | Payer: BLUE CROSS/BLUE SHIELD | Attending: Emergency Medicine | Admitting: Emergency Medicine

## 2018-03-23 DIAGNOSIS — B349 Viral infection, unspecified: Secondary | ICD-10-CM | POA: Insufficient documentation

## 2018-03-23 DIAGNOSIS — R05 Cough: Secondary | ICD-10-CM | POA: Diagnosis present

## 2018-03-23 MED ORDER — OSELTAMIVIR PHOSPHATE 75 MG PO CAPS: 75.0000 mg | ORAL_CAPSULE | Freq: Two times a day (BID) | ORAL | 0 refills | Status: DC

## 2018-03-23 MED ORDER — BENZONATATE 100 MG PO CAPS: 100.0000 mg | ORAL_CAPSULE | Freq: Three times a day (TID) | ORAL | 0 refills | Status: DC

## 2018-03-23 MED ORDER — FLUTICASONE PROPIONATE 50 MCG/ACT NA SUSP: 2.0000 | Freq: Every day | NASAL | 0 refills | Status: DC

## 2018-03-23 NOTE — ED Provider Notes (Signed)
[facility] EMERGENCY DEPARTMENT Provider Note   CSN: [id number] Arrival date & time: 03/23/18  1419     History   Chief Complaint Chief Complaint  Patient presents with  . Cough    HPI Tyrick Cesario is a 28 y.o. male.  HPI  Jshaun Voit is a 28 y.o. male, with a history of depression, presenting to the ED with cough for the past couple days.  Accompanied by nasal congestion, rhinorrhea, and chills.  Has tried TheraFlu and Alka-Seltzer.  Denies known fever, shortness of breath, chest pain, syncope, abdominal pain, N/V/D, or any other complaints.   Past Medical History:  Diagnosis Date  . Attention deficit   . Condyloma acuminatum in male    perianal & anal canal  . Depression     Patient Active Problem List   Diagnosis Date Noted  . Condyloma acuminatum in male 10/12/2010    History reviewed. No pertinent surgical history.      Home Medications    Prior to Admission medications   Medication Sig Start Date End Date Taking? Authorizing Provider  benzonatate (TESSALON) 100 MG capsule Take 1 capsule (100 mg total) by mouth every 8 (eight) hours. 03/23/18   Venesha Petraitis C, PA-C  fluticasone (FLONASE) 50 MCG/ACT nasal spray Place 2 sprays into both nostrils daily. 03/23/18   Hektor Huston C, PA-C  oseltamivir (TAMIFLU) 75 MG capsule Take 1 capsule (75 mg total) by mouth every 12 (twelve) hours. 03/23/18   Mickie Kozikowski, Hillard Danker, PA-C    Family History No family history on file.  Social History Social History   Tobacco Use  . Smoking status: Never Smoker  . Smokeless tobacco: Never Used  Substance Use Topics  . Alcohol use: No  . Drug use: No     Allergies   Patient has no known allergies.   Review of Systems Review of Systems  Constitutional: Positive for chills. Negative for fever.  HENT: Positive for congestion and rhinorrhea. Negative for ear pain, sore throat, trouble swallowing and voice change.   Respiratory: Positive for cough. Negative for shortness  of breath.   Cardiovascular: Negative for chest pain.  Gastrointestinal: Negative for abdominal pain, diarrhea, nausea and vomiting.  Skin: Negative for rash.  Neurological: Negative for syncope.  All other systems reviewed and are negative.    Physical Exam Updated Vital Signs BP 123/88 (BP Location: Left Arm)   Pulse (!) 114   Temp 100.2 F (37.9 C) (Oral)   Resp 18   Ht 5\' 11"  (1.803 m)   Wt 81.6 kg   SpO2 96%   BMI 25.10 kg/m   Physical Exam Vitals signs and nursing note reviewed.  Constitutional:      General: He is not in acute distress.    Appearance: He is well-developed. He is not diaphoretic.  HENT:     Head: Normocephalic and atraumatic.     Nose: Congestion and rhinorrhea present.     Mouth/Throat:     Mouth: Mucous membranes are moist.     Pharynx: Oropharynx is clear.  Eyes:     Conjunctiva/sclera: Conjunctivae normal.  Neck:     Musculoskeletal: Neck supple.  Cardiovascular:     Rate and Rhythm: Regular rhythm. Tachycardia present.     Pulses: Normal pulses.     Heart sounds: Normal heart sounds.     Comments: Mild tachycardia of about 102. Pulmonary:     Effort: Pulmonary effort is normal. No respiratory distress.     Breath sounds:  Normal breath sounds.  Abdominal:     Palpations: Abdomen is soft.     Tenderness: There is no abdominal tenderness. There is no guarding.  Musculoskeletal:     Right lower leg: No edema.     Left lower leg: No edema.  Lymphadenopathy:     Cervical: No cervical adenopathy.  Skin:    General: Skin is warm and dry.  Neurological:     Mental Status: He is alert.  Psychiatric:        Mood and Affect: Mood and affect normal.        Speech: Speech normal.        Behavior: Behavior normal.      ED Treatments / Results  Labs (all labs ordered are listed, but only abnormal results are displayed) Labs Reviewed - No data to display  EKG None  Radiology No results found.  Procedures Procedures (including  critical care time)  Medications Ordered in ED Medications - No data to display   Initial Impression / Assessment and Plan / ED Course  I have reviewed the triage vital signs and the nursing notes.  Pertinent labs & imaging results that were available during my care of the patient were reviewed by me and considered in my medical decision making (see chart for details).     Patient presents with symptoms consistent with viral syndrome, possibly influenza.  Nontoxic-appearing, mildly tachycardic, adequate SPO2 on room air, no apparent distress. The patient was given instructions for home care as well as return precautions. Patient voices understanding of these instructions, accepts the plan, and is comfortable with discharge.   Final Clinical Impressions(s) / ED Diagnoses   Final diagnoses:  Viral syndrome    ED Discharge Orders         Ordered    oseltamivir (TAMIFLU) 75 MG capsule  Every 12 hours     03/23/18 1451    fluticasone (FLONASE) 50 MCG/ACT nasal spray  Daily     03/23/18 1451    benzonatate (TESSALON) 100 MG capsule  Every 8 hours     03/23/18 1451           Anselm Pancoast, PA-C 03/23/18 1452    Derwood Kaplan, MD 03/24/18 [phone]

## 2018-03-23 NOTE — ED Triage Notes (Signed)
Cough and runny nose x 1 week

## 2018-03-23 NOTE — Discharge Instructions (Addendum)
Your symptoms are likely consistent with a viral illness. Viruses do not require or respond to antibiotics. Treatment is symptomatic care and it is important to note that these symptoms may last for 7-14 days.   Hand washing: Wash your hands throughout the day, but especially before and after touching the face, using the restroom, sneezing, coughing, or touching surfaces that have been coughed or sneezed upon. Hydration: Symptoms will be intensified and complicated by dehydration. Dehydration can also extend the duration of symptoms. Drink plenty of fluids and get plenty of rest. You should be drinking at least half a liter of water an hour to stay hydrated. Electrolyte drinks (ex. Gatorade, Powerade, Pedialyte) are also encouraged. You should be drinking enough fluids to make your urine light yellow, almost clear. If this is not the case, you are not drinking enough water. Please note that some of the treatments indicated below will not be effective if you are not adequately hydrated. Pain or fever: Ibuprofen, Naproxen, or acetaminophen (generic for Tylenol) for pain or fever.  Antiinflammatory medications: Take 600 mg of ibuprofen every 6 hours or 440 mg (over the counter dose) to 500 mg (prescription dose) of naproxen every 12 hours for the next 3 days. After this time, these medications may be used as needed for pain. Take these medications with food to avoid upset stomach. Choose only one of these medications, do not take them together. Acetaminophen (generic for Tylenol): Should you continue to have additional pain while taking the ibuprofen or naproxen, you may add in acetaminophen as needed. Your daily total maximum amount of acetaminophen from all sources should be limited to 4000mg /day for persons without liver problems, or 2000mg /day for those with liver problems. Diarrhea: May use medications such as loperamide (Imodium) or Bismuth subsalicylate (Pepto-Bismol). Cough: Use the benzonatate  (generic for Tessalon) for cough.  Tamiflu: This is an antiviral medication used to treat the flu.  The manufacture claim is that it can reduce duration of symptoms by about a day.  This medication is not required for treatment of the flu, it is optional.  If this medication causes more than 2 episodes of vomiting, may discontinue it. Zyrtec or Claritin: May add these medication daily to control underlying symptoms of congestion, sneezing, and other signs of allergies.  These medications are available over-the-counter. Generics: Cetirizine (generic for Zyrtec) and loratadine (generic for Claritin). Fluticasone: Use fluticasone (generic for Flonase), as directed, for nasal and sinus congestion.  This medication is available over-the-counter. Congestion: Plain guaifenesin (generic for plain Mucinex) may help relieve congestion. Saline sinus rinses and saline nasal sprays may also help relieve congestion. If you do not have high blood pressure, heart problems, or an allergy to such medications, you may also try phenylephrine or Sudafed. Sore throat: Warm liquids or Chloraseptic spray may help soothe a sore throat. Gargle twice a day with a salt water solution made from a half teaspoon of salt in a cup of warm water.  Follow up: Follow up with a primary care provider within the next two weeks should symptoms fail to resolve. Return: Return to the ED for significantly worsening symptoms, shortness of breath, persistent vomiting, large amounts of blood in stool, or any other major concerns.  For prescription assistance, may try using prescription discount sites or apps, such as goodrx.com

## 2019-03-04 ENCOUNTER — Emergency Department (HOSPITAL_BASED_OUTPATIENT_CLINIC_OR_DEPARTMENT_OTHER)
Admission: EM | Admit: 2019-03-04 | Discharge: 2019-03-04 | Disposition: A | Discharge status: Home / Self Care | Payer: BC Managed Care – PPO | Attending: Emergency Medicine | Admitting: Emergency Medicine

## 2019-03-04 ENCOUNTER — Encounter (HOSPITAL_BASED_OUTPATIENT_CLINIC_OR_DEPARTMENT_OTHER): Payer: Self-pay | Admitting: *Deleted

## 2019-03-04 ENCOUNTER — Emergency Department (HOSPITAL_BASED_OUTPATIENT_CLINIC_OR_DEPARTMENT_OTHER): Payer: BC Managed Care – PPO

## 2019-03-04 ENCOUNTER — Other Ambulatory Visit: Payer: Self-pay

## 2019-03-04 DIAGNOSIS — R110 Nausea: Secondary | ICD-10-CM | POA: Insufficient documentation

## 2019-03-04 DIAGNOSIS — R197 Diarrhea, unspecified: Secondary | ICD-10-CM | POA: Diagnosis not present

## 2019-03-04 DIAGNOSIS — K529 Noninfective gastroenteritis and colitis, unspecified: Secondary | ICD-10-CM | POA: Insufficient documentation

## 2019-03-04 DIAGNOSIS — Z79899 Other long term (current) drug therapy: Secondary | ICD-10-CM | POA: Diagnosis not present

## 2019-03-04 DIAGNOSIS — R109 Unspecified abdominal pain: Secondary | ICD-10-CM | POA: Diagnosis present

## 2019-03-04 LAB — URINALYSIS, ROUTINE W REFLEX MICROSCOPIC
Bilirubin Urine: NEGATIVE
Glucose, UA: NEGATIVE mg/dL
Hgb urine dipstick: NEGATIVE
Ketones, ur: 15 mg/dL — AB
Leukocytes,Ua: NEGATIVE
Nitrite: NEGATIVE
Protein, ur: 30 mg/dL — AB
Specific Gravity, Urine: 1.025 (ref 1.005–1.030)
pH: 6 (ref 5.0–8.0)

## 2019-03-04 LAB — COMPREHENSIVE METABOLIC PANEL WITH GFR
ALT: 12 U/L (ref 0–44)
AST: 28 U/L (ref 15–41)
Albumin: 4.5 g/dL (ref 3.5–5.0)
Alkaline Phosphatase: 70 U/L (ref 38–126)
Anion gap: 8 (ref 5–15)
BUN: 8 mg/dL (ref 6–20)
CO2: 28 mmol/L (ref 22–32)
Calcium: 9.3 mg/dL (ref 8.9–10.3)
Chloride: 101 mmol/L (ref 98–111)
Creatinine, Ser: 1.14 mg/dL (ref 0.61–1.24)
GFR calc Af Amer: >60 mL/min (ref >60)
GFR calc non Af Amer: >60 mL/min (ref >60)
Glucose, Bld: 94 mg/dL (ref 70–99)
Potassium: 3.4 mmol/L — ABNORMAL LOW (ref 3.5–5.1)
Sodium: 137 mmol/L (ref 135–145)
Total Bilirubin: 0.3 mg/dL (ref 0.3–1.2)
Total Protein: 9 g/dL — ABNORMAL HIGH (ref 6.5–8.1)

## 2019-03-04 LAB — CBC WITH DIFFERENTIAL/PLATELET
Abs Immature Granulocytes: 0.01 K/uL (ref 0.00–0.07)
Basophils Absolute: 0 K/uL (ref 0.0–0.1)
Basophils Relative: 0 %
Eosinophils Absolute: 0.1 K/uL (ref 0.0–0.5)
Eosinophils Relative: 1 %
HCT: 44.9 % (ref 39.0–52.0)
Hemoglobin: 14.4 g/dL (ref 13.0–17.0)
Immature Granulocytes: 0 %
Lymphocytes Relative: 39 %
Lymphs Abs: 2.1 K/uL (ref 0.7–4.0)
MCH: 28.9 pg (ref 26.0–34.0)
MCHC: 32.1 g/dL (ref 30.0–36.0)
MCV: 90.2 fL (ref 80.0–100.0)
Monocytes Absolute: 0.8 K/uL (ref 0.1–1.0)
Monocytes Relative: 16 %
Neutro Abs: 2.3 K/uL (ref 1.7–7.7)
Neutrophils Relative %: 44 %
Platelets: 300 K/uL (ref 150–400)
RBC: 4.98 MIL/uL (ref 4.22–5.81)
RDW: 12.4 % (ref 11.5–15.5)
WBC: 5.3 K/uL (ref 4.0–10.5)
nRBC: 0 % (ref 0.0–0.2)

## 2019-03-04 LAB — URINALYSIS, MICROSCOPIC (REFLEX)

## 2019-03-04 LAB — RAPID URINE DRUG SCREEN, HOSP PERFORMED
Amphetamines: NOT DETECTED
Barbiturates: NOT DETECTED
Benzodiazepines: NOT DETECTED
Cocaine: NOT DETECTED
Opiates: NOT DETECTED
Tetrahydrocannabinol: NOT DETECTED

## 2019-03-04 LAB — LIPASE, BLOOD: Lipase: 39 U/L (ref 11–51)

## 2019-03-04 MED ORDER — SODIUM CHLORIDE 0.9 % IV BOLUS
1000.0000 mL | Freq: Once | INTRAVENOUS | Status: AC
  Administered 2019-03-04: 1000 mL via INTRAVENOUS

## 2019-03-04 MED ORDER — IOHEXOL 300 MG/ML  SOLN
100.0000 mL | Freq: Once | INTRAMUSCULAR | Status: AC | PRN
  Administered 2019-03-04: 19:00:00 100 mL via INTRAVENOUS

## 2019-03-04 MED ORDER — ONDANSETRON HCL 4 MG/2ML IJ SOLN
4.0000 mg | Freq: Once | INTRAMUSCULAR | Status: AC
Start: 2019-03-04 — End: 2019-03-04
  Administered 2019-03-04: 4 mg via INTRAVENOUS
  Filled 2019-03-04: qty 2

## 2019-03-04 MED ORDER — SODIUM CHLORIDE 0.9 % IV BOLUS
1000.0000 mL | Freq: Once | INTRAVENOUS | Status: AC
  Administered 2019-03-04: 19:00:00 1000 mL via INTRAVENOUS

## 2019-03-04 MED ORDER — ONDANSETRON 4 MG PO TBDP: 4.0000 mg | ORAL_TABLET | Freq: Three times a day (TID) | ORAL | 0 refills | Status: DC | PRN

## 2019-03-04 NOTE — ED Notes (Signed)
Patient given water for PO fluid challenge, tolerating well so far will recheck.

## 2019-03-04 NOTE — Discharge Instructions (Signed)
Your labs are normal.  Your CT scan shows diffuse inflammation of your colon which is likely infectious.  The gastroenterologist does not recommend any antibiotics at this time.  You should keep yourself hydrated and follow-up with him in the office next week.  Return to the ED sooner with worsening pain, fever, vomiting, any other concerns.

## 2019-03-04 NOTE — ED Provider Notes (Signed)
[place] EMERGENCY DEPARTMENT Provider Note   CSN: [id number] Arrival date & time: 03/04/19  1557     History Chief Complaint  Patient presents with  . Abdominal Pain    Stephen Lee is a 29 y.o. male.  Patient here with 1 week history of abdominal pain that occurs after eating.  States it does not hurt matter what he eats but after he eats he feels "being punched in the stomach" that last for about 10 or 15 minutes and resolves.  This is associated with several episodes of diarrhea after he eats that is intermittently bloody.  Denies any black stools.  Denies any nausea, vomiting or fever.  He had a virtual visit with a doctor and was told to get a coronavirus test which was negative.  He denies any stomach issues.  No previous abdominal surgeries.  No pain with urination or blood in the urine. Denies any illicit drug use.  No chest pain or shortness of breath.  The pain feels like "being punched in the stomach" and resolved after about 20 minutes.  He is not having any pain currently.  The history is provided by the patient.  Abdominal Pain Associated symptoms: nausea   Associated symptoms: no chest pain, no cough, no dysuria, no fever, no hematuria, no shortness of breath and no vomiting        Past Medical History:  Diagnosis Date  . Attention deficit   . Condyloma acuminatum in male    perianal & anal canal  . Depression     Patient Active Problem List   Diagnosis Date Noted  . Condyloma acuminatum in male 10/12/2010    History reviewed. No pertinent surgical history.     No family history on file.  Social History   Tobacco Use  . Smoking status: Never Smoker  . Smokeless tobacco: Never Used  Substance Use Topics  . Alcohol use: No  . Drug use: No    Home Medications Prior to Admission medications   Medication Sig Start Date End Date Taking? Authorizing Provider  benzonatate (TESSALON) 100 MG capsule Take 1 capsule (100 mg total) by mouth  every 8 (eight) hours. 03/23/18   Joy, Shawn C, PA-C  fluticasone (FLONASE) 50 MCG/ACT nasal spray Place 2 sprays into both nostrils daily. 03/23/18   Joy, Shawn C, PA-C  oseltamivir (TAMIFLU) 75 MG capsule Take 1 capsule (75 mg total) by mouth every 12 (twelve) hours. 03/23/18   Joy, Shawn C, PA-C    Allergies    Patient has no known allergies.  Review of Systems   Review of Systems  Constitutional: Positive for activity change and appetite change. Negative for fever.  HENT: Negative for congestion and rhinorrhea.   Respiratory: Negative for cough, chest tightness and shortness of breath.   Cardiovascular: Negative for chest pain.  Gastrointestinal: Positive for abdominal pain, blood in stool and nausea. Negative for vomiting.  Genitourinary: Negative for dysuria and hematuria.  Musculoskeletal: Positive for arthralgias and myalgias.  Skin: Negative for rash.  Neurological: Negative for dizziness, weakness and headaches.   all other systems are negative except as noted in the HPI and PMH.    Physical Exam Updated Vital Signs BP (!) 135/92   Pulse (!) 111   Temp 98.5 F (36.9 C)   Resp 18   Ht 5\' 11"  (1.803 m)   Wt 74.8 kg   SpO2 100%   BMI 23.01 kg/m   Physical Exam Vitals and nursing note reviewed.  Constitutional:      General: He is not in acute distress.    Appearance: He is well-developed.  HENT:     Head: Normocephalic and atraumatic.     Mouth/Throat:     Pharynx: No oropharyngeal exudate.  Eyes:     Conjunctiva/sclera: Conjunctivae normal.     Pupils: Pupils are equal, round, and reactive to light.  Neck:     Comments: No meningismus. Cardiovascular:     Rate and Rhythm: Regular rhythm. Tachycardia present.     Heart sounds: Normal heart sounds. No murmur.  Pulmonary:     Effort: Pulmonary effort is normal. No respiratory distress.     Breath sounds: Normal breath sounds.  Abdominal:     Palpations: Abdomen is soft.     Tenderness: There is abdominal  tenderness. There is no guarding or rebound.     Comments: Well-appearing, no distress, abdomen soft, mild periumbilical abdominal pain  Musculoskeletal:        General: No tenderness. Normal range of motion.     Cervical back: Normal range of motion and neck supple.     Comments: No CVA tenderness  Skin:    General: Skin is warm.     Capillary Refill: Capillary refill takes less than 2 seconds.  Neurological:     General: No focal deficit present.     Mental Status: He is alert and oriented to person, place, and time. Mental status is at baseline.     Cranial Nerves: No cranial nerve deficit.     Motor: No abnormal muscle tone.     Coordination: Coordination normal.     Comments: No ataxia on finger to nose bilaterally. No pronator drift. 5/5 strength throughout. CN 2-12 intact.Equal grip strength. Sensation intact.   Psychiatric:        Behavior: Behavior normal.     ED Results / Procedures / Treatments   Labs (all labs ordered are listed, but only abnormal results are displayed) Labs Reviewed  URINALYSIS, ROUTINE W REFLEX MICROSCOPIC - Abnormal; Notable for the following components:      Result Value   Ketones, ur 15 (*)    Protein, ur 30 (*)    All other components within normal limits  COMPREHENSIVE METABOLIC PANEL - Abnormal; Notable for the following components:   Potassium 3.4 (*)    Total Protein 9.0 (*)    All other components within normal limits  URINALYSIS, MICROSCOPIC (REFLEX) - Abnormal; Notable for the following components:   Bacteria, UA FEW (*)    All other components within normal limits  CBC WITH DIFFERENTIAL/PLATELET  LIPASE, BLOOD  RAPID URINE DRUG SCREEN, HOSP PERFORMED  POC OCCULT BLOOD, ED    EKG None  Radiology CT ABDOMEN PELVIS W CONTRAST  Result Date: 03/04/2019 CLINICAL DATA:  Acute abdominal pain. EXAM: CT ABDOMEN AND PELVIS WITH CONTRAST TECHNIQUE: Multidetector CT imaging of the abdomen and pelvis was performed using the standard  protocol following bolus administration of intravenous contrast. CONTRAST:  OMNIPAQUE IOHEXOL 300 MG/ML  SOLN COMPARISON:  None. FINDINGS: Lower chest: The lung bases are clear. The heart size is normal. Hepatobiliary: The liver is normal. Normal gallbladder.There is no biliary ductal dilation. Pancreas: Normal contours without ductal dilatation. No peripancreatic fluid collection. Spleen: No splenic laceration or hematoma. Adrenals/Urinary Tract: --Adrenal glands: No adrenal hemorrhage. --Right kidney/ureter: No hydronephrosis or perinephric hematoma. --Left kidney/ureter: No hydronephrosis or perinephric hematoma. --Urinary bladder: Unremarkable. Stomach/Bowel: --Stomach/Duodenum: No hiatal hernia or other gastric abnormality. Normal duodenal course  and caliber. --Small bowel: No dilatation or inflammation. --Colon: There is diffuse wall thickening throughout the majority of the colon. Liquid stool is noted in the colon. There is some questionable wall thickening of the terminal ileum which may represent backwash ileitis. --Appendix: Normal. Vascular/Lymphatic: Normal course and caliber of the major abdominal vessels. --No retroperitoneal lymphadenopathy. --there are multiple prominent mesenteric lymph nodes, most notably in the right lower quadrant. These are presumably reactive. --No pelvic or inguinal lymphadenopathy. Reproductive: The prostate gland is mildly enlarged. Other: No ascites or free air. The abdominal wall is normal. Musculoskeletal. No acute displaced fractures. IMPRESSION: 1. Findings compatible with pancolitis (infectious or inflammatory) with possible backwash ileitis. No bowel obstruction or perforation. 2. Multiple prominent mesenteric lymph nodes are presumably reactive in etiology. Electronically Signed   By: Katherine Mantle M.D.   On: 03/04/2019 19:32    Procedures Procedures (including critical care time)  Medications Ordered in ED Medications - No data to display  ED  Course  I have reviewed the triage vital signs and the nursing notes.  Pertinent labs & imaging results that were available during my care of the patient were reviewed by me and considered in my medical decision making (see chart for details).    MDM Rules/Calculators/A&P                     Intermittent abdominal pain with diarrhea for the past week. Soft without peritoneal signs.  Mildly tachycardic on exam  Labs are reassuring.  CT scan shows diffuse pancolitis and backwash ileitis.  Patient denies any family history of IBD.  Results discussed with Dr. Matthias Hughs of [facility].  He suspects likely infectious dysentery colitis.  He recommends supportive care and no antibiotics or steroids at this time.  Patient will need GI follow-up and return to the ED with worsening pain, fever, vomiting, any other concerns.  Patient tolerating p.o. without difficulty.  He received IV fluids and his heart rate has normalized.  His abdomen is soft without peritoneal signs.  Advise close follow-up with gastroenterology.  Will treat supportively.  No antibiotics or steroids at this time per GI.  Return precautions discussed including worsening pain, fever, vomiting, any other concerns  BP 118/79 (BP Location: Right Arm)   Pulse 81   Temp 98.5 F (36.9 C)   Resp 16   Ht 5\' 11"  (1.803 m)   Wt 74.8 kg   SpO2 98%   BMI 23.01 kg/m   Final Clinical Impression(s) / ED Diagnoses Final diagnoses:  Colitis    Rx / DC Orders ED Discharge Orders    None       , MD 03/04/19 2158

## 2019-03-04 NOTE — ED Triage Notes (Signed)
C/o abd pain and diarrhea 1 week

## 2020-07-17 ENCOUNTER — Emergency Department (HOSPITAL_BASED_OUTPATIENT_CLINIC_OR_DEPARTMENT_OTHER)
Admission: EM | Admit: 2020-07-17 | Discharge: 2020-07-17 | Disposition: A | Discharge status: Home / Self Care | Payer: BC Managed Care – PPO | Attending: Emergency Medicine | Admitting: Emergency Medicine

## 2020-07-17 ENCOUNTER — Other Ambulatory Visit: Payer: Self-pay

## 2020-07-17 ENCOUNTER — Encounter (HOSPITAL_BASED_OUTPATIENT_CLINIC_OR_DEPARTMENT_OTHER): Payer: Self-pay | Admitting: Emergency Medicine

## 2020-07-17 DIAGNOSIS — R21 Rash and other nonspecific skin eruption: Secondary | ICD-10-CM | POA: Insufficient documentation

## 2020-07-17 MED ORDER — PREDNISONE 50 MG PO TABS
60.0000 mg | ORAL_TABLET | Freq: Once | ORAL | Status: AC
  Administered 2020-07-17: 60 mg via ORAL
  Filled 2020-07-17: qty 1

## 2020-07-17 MED ORDER — PREDNISONE 20 MG PO TABS: 60.0000 mg | ORAL_TABLET | Freq: Every day | ORAL | 0 refills | Status: AC

## 2020-07-17 NOTE — ED Provider Notes (Signed)
[facility] EMERGENCY DEPARTMENT Provider Note   CSN: [id number] Arrival date & time: 07/17/20  1421     History Chief Complaint  Patient presents with  . Rash    Stephen Lee is a 30 y.o. male.  The history is provided by the patient.  Rash Location:  Face and torso Facial rash location:  Face Quality: itchiness   Severity:  Mild Onset quality:  Gradual Duration:  2 days Timing:  Constant Chronicity:  New Context: not medications   Associated symptoms: no abdominal pain, no diarrhea, no fatigue, no fever, no headaches, no hoarse voice, no induration, no joint pain, no myalgias, no nausea, no periorbital edema, no shortness of breath, no sore throat, no throat swelling, no tongue swelling, no URI, not vomiting and not wheezing        Past Medical History:  Diagnosis Date  . Attention deficit   . Condyloma acuminatum in male    perianal & anal canal  . Depression     Patient Active Problem List   Diagnosis Date Noted  . Condyloma acuminatum in male 10/12/2010    History reviewed. No pertinent surgical history.     No family history on file.  Social History   Tobacco Use  . Smoking status: Never Smoker  . Smokeless tobacco: Never Used  Vaping Use  . Vaping Use: Never used  Substance Use Topics  . Alcohol use: Yes  . Drug use: No    Home Medications Prior to Admission medications   Medication Sig Start Date End Date Taking? Authorizing Provider  benzonatate (TESSALON) 100 MG capsule Take 1 capsule (100 mg total) by mouth every 8 (eight) hours. 03/23/18   Joy, Shawn C, PA-C  fluticasone (FLONASE) 50 MCG/ACT nasal spray Place 2 sprays into both nostrils daily. 03/23/18   Joy, Shawn C, PA-C  ondansetron (ZOFRAN ODT) 4 MG disintegrating tablet Take 1 tablet (4 mg total) by mouth every 8 (eight) hours as needed for nausea or vomiting. 03/04/19   Rancour, Jeannett Senior, MD  oseltamivir (TAMIFLU) 75 MG capsule Take 1 capsule (75 mg total) by mouth every 12  (twelve) hours. 03/23/18   Joy, Shawn C, PA-C    Allergies    Patient has no known allergies.  Review of Systems   Review of Systems  Constitutional: Negative for fatigue and fever.  HENT: Negative for hoarse voice and sore throat.   Respiratory: Negative for shortness of breath and wheezing.   Gastrointestinal: Negative for abdominal pain, diarrhea, nausea and vomiting.  Musculoskeletal: Negative for arthralgias and myalgias.  Skin: Positive for rash.  Neurological: Negative for headaches.    Physical Exam Updated Vital Signs BP (!) 133/97 (BP Location: Right Arm)   Pulse 98   Temp 98.8 F (37.1 C) (Oral)   Resp 18   Ht 5\' 11"  (1.803 m)   Wt 77.1 kg   SpO2 100%   BMI 23.71 kg/m   Physical Exam Constitutional:      General: He is not in acute distress.    Appearance: He is not ill-appearing.  Cardiovascular:     Pulses: Normal pulses.  Skin:    Capillary Refill: Capillary refill takes less than 2 seconds.     Findings: Rash present.     Comments: Has hives to face and chest but also looks like may be a folliculitis  Neurological:     General: No focal deficit present.     Mental Status: He is alert.     ED  Results / Procedures / Treatments   Labs (all labs ordered are listed, but only abnormal results are displayed) Labs Reviewed - No data to display  EKG None  Radiology No results found.  Procedures Procedures   Medications Ordered in ED Medications  predniSONE (DELTASONE) tablet 60 mg (has no administration in time range)    ED Course  I have reviewed the triage vital signs and the nursing notes.  Pertinent labs & imaging results that were available during my care of the patient were reviewed by me and considered in my medical decision making (see chart for details).    MDM Rules/Calculators/A&P                          Brison Erbe is here with rash.  Seems to maybe be hives but there are some also inflammation of some hair follicles and may  be an element of some mild folliculitis.  Will conservatively treat with prednisone.  No concern for anaphylaxis.  Given reassurance and discharged in ED in good condition.  Understands return precautions.  This chart was dictated using voice recognition software.  Despite best efforts to proofread,  errors can occur which can change the documentation meaning.   Final Clinical Impression(s) / ED Diagnoses Final diagnoses:  Rash    Rx / DC Orders ED Discharge Orders    None       Virgina Norfolk, DO 07/17/20 1628

## 2020-07-17 NOTE — ED Triage Notes (Signed)
Pt reports rash to face, chest and back noticed yesterday morning when he woke up. Pt took 2 benadryl and 1 tylenol last night. Pt unsure what he came in contact with, denies use of new products.

## 2021-05-21 ENCOUNTER — Emergency Department (HOSPITAL_BASED_OUTPATIENT_CLINIC_OR_DEPARTMENT_OTHER): Payer: Managed Care, Other (non HMO)

## 2021-05-21 ENCOUNTER — Other Ambulatory Visit: Payer: Self-pay

## 2021-05-21 ENCOUNTER — Emergency Department (HOSPITAL_BASED_OUTPATIENT_CLINIC_OR_DEPARTMENT_OTHER)
Admission: EM | Admit: 2021-05-21 | Discharge: 2021-05-21 | Disposition: A | Discharge status: Home / Self Care | Payer: Managed Care, Other (non HMO) | Attending: Emergency Medicine | Admitting: Emergency Medicine

## 2021-05-21 ENCOUNTER — Encounter (HOSPITAL_BASED_OUTPATIENT_CLINIC_OR_DEPARTMENT_OTHER): Payer: Self-pay | Admitting: Emergency Medicine

## 2021-05-21 DIAGNOSIS — R0602 Shortness of breath: Secondary | ICD-10-CM | POA: Diagnosis present

## 2021-05-21 DIAGNOSIS — R000 Tachycardia, unspecified: Secondary | ICD-10-CM | POA: Diagnosis not present

## 2021-05-21 DIAGNOSIS — Z20822 Contact with and (suspected) exposure to covid-19: Secondary | ICD-10-CM | POA: Diagnosis not present

## 2021-05-21 DIAGNOSIS — J189 Pneumonia, unspecified organism: Secondary | ICD-10-CM | POA: Diagnosis not present

## 2021-05-21 DIAGNOSIS — R7401 Elevation of levels of liver transaminase levels: Secondary | ICD-10-CM | POA: Insufficient documentation

## 2021-05-21 LAB — URINALYSIS, ROUTINE W REFLEX MICROSCOPIC
Bilirubin Urine: NEGATIVE
Glucose, UA: NEGATIVE mg/dL
Hgb urine dipstick: NEGATIVE
Ketones, ur: NEGATIVE mg/dL
Leukocytes,Ua: NEGATIVE
Nitrite: NEGATIVE
Protein, ur: NEGATIVE mg/dL
Specific Gravity, Urine: 1.01 (ref 1.005–1.030)
pH: 8.5 — ABNORMAL HIGH (ref 5.0–8.0)

## 2021-05-21 LAB — CBC WITH DIFFERENTIAL/PLATELET
Abs Immature Granulocytes: 0.03 10*3/uL (ref 0.00–0.07)
Basophils Absolute: 0 10*3/uL (ref 0.0–0.1)
Basophils Relative: 0 %
Eosinophils Absolute: 0.1 10*3/uL (ref 0.0–0.5)
Eosinophils Relative: 2 %
HCT: 42.3 % (ref 39.0–52.0)
Hemoglobin: 14 g/dL (ref 13.0–17.0)
Immature Granulocytes: 1 %
Lymphocytes Relative: 16 %
Lymphs Abs: 1 10*3/uL (ref 0.7–4.0)
MCH: 30 pg (ref 26.0–34.0)
MCHC: 33.1 g/dL (ref 30.0–36.0)
MCV: 90.8 fL (ref 80.0–100.0)
Monocytes Absolute: 0.6 10*3/uL (ref 0.1–1.0)
Monocytes Relative: 10 %
Neutro Abs: 4.6 10*3/uL (ref 1.7–7.7)
Neutrophils Relative %: 71 %
Platelets: 275 10*3/uL (ref 150–400)
RBC: 4.66 MIL/uL (ref 4.22–5.81)
RDW: 13.3 % (ref 11.5–15.5)
WBC: 6.3 10*3/uL (ref 4.0–10.5)
nRBC: 0 % (ref 0.0–0.2)

## 2021-05-21 LAB — RESP PANEL BY RT-PCR (FLU A&B, COVID) ARPGX2
Influenza A by PCR: NEGATIVE
Influenza B by PCR: NEGATIVE
SARS Coronavirus 2 by RT PCR: NEGATIVE

## 2021-05-21 LAB — COMPREHENSIVE METABOLIC PANEL
ALT: 19 U/L (ref 0–44)
AST: 43 U/L — ABNORMAL HIGH (ref 15–41)
Albumin: 4.3 g/dL (ref 3.5–5.0)
Alkaline Phosphatase: 109 U/L (ref 38–126)
Anion gap: 8 (ref 5–15)
BUN: 8 mg/dL (ref 6–20)
CO2: 30 mmol/L (ref 22–32)
Calcium: 9.3 mg/dL (ref 8.9–10.3)
Chloride: 103 mmol/L (ref 98–111)
Creatinine, Ser: 1.19 mg/dL (ref 0.61–1.24)
GFR, Estimated: >60 mL/min (ref >60)
Glucose, Bld: 91 mg/dL (ref 70–99)
Potassium: 3.7 mmol/L (ref 3.5–5.1)
Sodium: 141 mmol/L (ref 135–145)
Total Bilirubin: 0.5 mg/dL (ref 0.3–1.2)
Total Protein: 8.9 g/dL — ABNORMAL HIGH (ref 6.5–8.1)

## 2021-05-21 LAB — D-DIMER, QUANTITATIVE: D-Dimer, Quant: 0.38 ug/mL-FEU (ref 0.00–0.50)

## 2021-05-21 LAB — TROPONIN I (HIGH SENSITIVITY): Troponin I (High Sensitivity): 9 ng/L (ref <18)

## 2021-05-21 LAB — BRAIN NATRIURETIC PEPTIDE: B Natriuretic Peptide: 29.9 pg/mL (ref 0.0–100.0)

## 2021-05-21 MED ORDER — AMOXICILLIN-POT CLAVULANATE 875-125 MG PO TABS: 1.0000 | ORAL_TABLET | Freq: Two times a day (BID) | ORAL | 0 refills | Status: AC

## 2021-05-21 MED ORDER — AZITHROMYCIN 250 MG PO TABS: 250.0000 mg | ORAL_TABLET | Freq: Every day | ORAL | 0 refills | Status: AC

## 2021-05-21 MED ORDER — AMOXICILLIN-POT CLAVULANATE 875-125 MG PO TABS
1.0000 | ORAL_TABLET | Freq: Once | ORAL | Status: AC
  Administered 2021-05-21: 1 via ORAL
  Filled 2021-05-21: qty 1

## 2021-05-21 MED ORDER — AZITHROMYCIN 250 MG PO TABS
500.0000 mg | ORAL_TABLET | Freq: Once | ORAL | Status: AC
  Administered 2021-05-21: 500 mg via ORAL
  Filled 2021-05-21: qty 2

## 2021-05-21 MED ORDER — SODIUM CHLORIDE 0.9 % IV BOLUS
1000.0000 mL | Freq: Once | INTRAVENOUS | Status: AC
  Administered 2021-05-21: 1000 mL via INTRAVENOUS

## 2021-05-21 MED ORDER — IOHEXOL 350 MG/ML SOLN
100.0000 mL | Freq: Once | INTRAVENOUS | Status: AC | PRN
Start: 2021-05-21 — End: 2021-05-21
  Administered 2021-05-21: 75 mL via INTRAVENOUS

## 2021-05-21 NOTE — Discharge Instructions (Signed)
Your CT scan is concerning for pneumonia. I have put you on two antibiotics. You received your first doses here in our ED, so start taking them tomorrow morning. Please complete course. If you get any worse while on antibiotics, please return to the emergency department.  ?

## 2021-05-21 NOTE — ED Notes (Signed)
ED Provider at bedside. 

## 2021-05-21 NOTE — ED Provider Notes (Signed)
?[facility] EMERGENCY DEPARTMENT ?Provider Note ? ? ?CSN: [id number] ?Arrival date & time: 05/21/21  1307 ? ?  ? ?History ?PMH: n/a ?Chief Complaint  ?Patient presents with  ? Shortness of Breath  ? ? ?Stephen Lee is a 31 y.o. male. ?Presents the ED with a chief complaint of shortness of breath.  States that he started noticing it about a week ago.  It was usually present with exertion.  He says that he primarily works at home and is at a desk a lot so he has not really noticing it too much.  Says over the past 2 days the shortness of breath has acutely worsened.  He stepped outside yesterday to go for a walk and felt like he could barely get anywhere without being extremely dyspneic.  He says the only time he had chest pain is when he has taken a really really deep breath or if he coughs.  This is in the upper center chest.  He denies any back pain, abdominal pain, nausea, vomiting, fevers, leg swelling, cough, hemoptysis, upper respiratory symptoms.  He has no history of blood clots.  Not on any medications.  No recent changes.  He does not feel wheezy.  No history of asthma or smoking.  ? ? ?Shortness of Breath ?Associated symptoms: no abdominal pain, no chest pain, no cough, no fever, no sore throat, no vomiting and no wheezing   ? ?  ? ?Home Medications ?Prior to Admission medications   ?Medication Sig Start Date End Date Taking? Authorizing Provider  ?amoxicillin-clavulanate (AUGMENTIN) 875-125 MG tablet Take 1 tablet by mouth 2 (two) times daily for 5 days. 05/21/21 05/26/21 Yes Locklyn Henriquez, Finis Bud, PA-C  ?azithromycin (ZITHROMAX) 250 MG tablet Take 1 tablet (250 mg total) by mouth daily for 5 days. Take first 2 tablets together, then 1 every day until finished. 05/21/21 05/26/21 Yes Eleanore Junio, Finis Bud, PA-C  ?benzonatate (TESSALON) 100 MG capsule Take 1 capsule (100 mg total) by mouth every 8 (eight) hours. 03/23/18   Joy, Shawn C, PA-C  ?fluticasone (FLONASE) 50 MCG/ACT nasal spray Place 2 sprays into both  nostrils daily. 03/23/18   Joy, Shawn C, PA-C  ?ondansetron (ZOFRAN ODT) 4 MG disintegrating tablet Take 1 tablet (4 mg total) by mouth every 8 (eight) hours as needed for nausea or vomiting. 03/04/19   Rancour, Jeannett Senior, MD  ?oseltamivir (TAMIFLU) 75 MG capsule Take 1 capsule (75 mg total) by mouth every 12 (twelve) hours. 03/23/18   Joy, Hillard Danker, PA-C  ?   ? ?Allergies    ?Patient has no known allergies.   ? ?Review of Systems   ?Review of Systems  ?Constitutional:  Negative for chills and fever.  ?HENT:  Negative for congestion, rhinorrhea and sore throat.   ?Respiratory:  Positive for chest tightness and shortness of breath. Negative for cough and wheezing.   ?Cardiovascular:  Negative for chest pain and leg swelling.  ?Gastrointestinal:  Negative for abdominal pain, nausea and vomiting.  ?All other systems reviewed and are negative. ? ?Physical Exam ?Updated Vital Signs ?BP (!) 135/105   Pulse 100   Temp 99.2 ?F (37.3 ?C) (Oral)   Resp (!) 23   Ht 5\' 11"  (1.803 m)   Wt 77.1 kg   SpO2 95%   BMI 23.71 kg/m?  ?Physical Exam ?Vitals and nursing note reviewed.  ?Constitutional:   ?   General: He is not in acute distress. ?   Appearance: Normal appearance. He is well-developed. He is not  ill-appearing, toxic-appearing or diaphoretic.  ?HENT:  ?   Head: Normocephalic and atraumatic.  ?   Nose: No nasal deformity.  ?   Mouth/Throat:  ?   Lips: Pink. No lesions.  ?   Mouth: Mucous membranes are moist. No injury, lacerations, oral lesions or angioedema.  ?   Pharynx: Oropharynx is clear. Uvula midline. No pharyngeal swelling, oropharyngeal exudate, posterior oropharyngeal erythema or uvula swelling.  ?Eyes:  ?   General: Gaze aligned appropriately. No scleral icterus.    ?   Right eye: No discharge.     ?   Left eye: No discharge.  ?   Conjunctiva/sclera: Conjunctivae normal.  ?   Right eye: Right conjunctiva is not injected. No exudate or hemorrhage. ?   Left eye: Left conjunctiva is not injected. No exudate or  hemorrhage. ?Neck:  ?   Trachea: No tracheal deviation.  ?Cardiovascular:  ?   Rate and Rhythm: Regular rhythm. Tachycardia present.  ?   Pulses: Normal pulses.     ?     Radial pulses are 2+ on the right side and 2+ on the left side.  ?     Dorsalis pedis pulses are 2+ on the right side and 2+ on the left side.  ?   Heart sounds: Normal heart sounds, S1 normal and S2 normal. Heart sounds not distant. No murmur heard. ?  No friction rub. No gallop. No S3 or S4 sounds.  ?Pulmonary:  ?   Effort: Pulmonary effort is normal. Tachypnea present. No accessory muscle usage or respiratory distress.  ?   Breath sounds: Normal breath sounds. No stridor. No decreased breath sounds, wheezing, rhonchi or rales.  ?Chest:  ?   Chest wall: No tenderness.  ?Abdominal:  ?   General: Abdomen is flat. There is no distension.  ?   Palpations: Abdomen is soft. There is no mass or pulsatile mass.  ?   Tenderness: There is no abdominal tenderness. There is no right CVA tenderness, left CVA tenderness, guarding or rebound.  ?Musculoskeletal:  ?   Right lower leg: No tenderness. No edema.  ?   Left lower leg: No tenderness. No edema.  ?Skin: ?   General: Skin is warm and dry.  ?   Coloration: Skin is not jaundiced or pale.  ?   Findings: No bruising, erythema, lesion or rash.  ?Neurological:  ?   General: No focal deficit present.  ?   Mental Status: He is alert and oriented to person, place, and time.  ?   GCS: GCS eye subscore is 4. GCS verbal subscore is 5. GCS motor subscore is 6.  ?Psychiatric:     ?   Mood and Affect: Mood normal.     ?   Behavior: Behavior normal. Behavior is cooperative.  ? ? ?ED Results / Procedures / Treatments   ?Labs ?(all labs ordered are listed, but only abnormal results are displayed) ?Labs Reviewed  ?COMPREHENSIVE METABOLIC PANEL - Abnormal; Notable for the following components:  ?    Result Value  ? Total Protein 8.9 (*)   ? AST 43 (*)   ? All other components within normal limits  ?URINALYSIS, ROUTINE W  REFLEX MICROSCOPIC - Abnormal; Notable for the following components:  ? pH 8.5 (*)   ? All other components within normal limits  ?RESP PANEL BY RT-PCR (FLU A&B, COVID) ARPGX2  ?CBC WITH DIFFERENTIAL/PLATELET  ?D-DIMER, QUANTITATIVE  ?BRAIN NATRIURETIC PEPTIDE  ?TROPONIN I (HIGH SENSITIVITY)  ?TROPONIN I (  HIGH SENSITIVITY)  ? ? ?EKG ?EKG Interpretation ? ?Date/Time:  Sunday May 21 2021 13:22:51 EDT ?Ventricular Rate:  126 ?PR Interval:  138 ?QRS Duration: 77 ?QT Interval:  290 ?QTC Calculation: 420 ?R Axis:   19 ?Text Interpretation: Sinus tachycardia LVH by voltage no acute ST/T changes No old tracing to compare Confirmed by Sherwood Gambler [phone]) on 05/21/2021 1:28:42 PM ? ?Radiology ?DG Chest 2 View ? ?Result Date: 05/21/2021 ?CLINICAL DATA:  Shortness of breath EXAM: CHEST - 2 VIEW COMPARISON:  Chest x-ray dated 07/17/2008. FINDINGS: Study is hypoinspiratory with crowding of the bilateral perihilar and bibasilar bronchovascular markings. Given the low lung volumes, lungs appear clear. No confluent opacity to suggest a consolidating pneumonia. No pleural effusion or pneumothorax is seen. Osseous structures about the chest are unremarkable. IMPRESSION: Low lung volumes. No active cardiopulmonary disease. No evidence of pneumonia or pulmonary edema. Electronically Signed   By: Franki Cabot M.D.   On: 05/21/2021 14:06  ? ?CT Angio Chest PE W/Cm &/Or Wo Cm ? ?Result Date: 05/21/2021 ?CLINICAL DATA:  Shortness of breath for a week, increased with exertion. Pulmonary embolism suspected. EXAM: CT ANGIOGRAPHY CHEST WITH CONTRAST TECHNIQUE: Multidetector CT imaging of the chest was performed using the standard protocol during bolus administration of intravenous contrast. Multiplanar CT image reconstructions and MIPs were obtained to evaluate the vascular anatomy. RADIATION DOSE REDUCTION: This exam was performed according to the departmental dose-optimization program which includes automated exposure control, adjustment of  the mA and/or kV according to patient size and/or use of iterative reconstruction technique. CONTRAST:  68mL OMNIPAQUE IOHEXOL 350 MG/ML SOLN COMPARISON:  None. FINDINGS: Cardiovascular: There is no pulmonary embolism i

## 2021-05-21 NOTE — ED Triage Notes (Addendum)
Pt c/o SHOB x about 1 wk, especially with exertion; difficulty speaking in full sentences ?

## 2021-05-21 NOTE — ED Notes (Signed)
Patient transported to CT 

## 2021-06-06 ENCOUNTER — Encounter: Payer: Self-pay | Admitting: Physician Assistant

## 2021-06-06 ENCOUNTER — Ambulatory Visit (INDEPENDENT_AMBULATORY_CARE_PROVIDER_SITE_OTHER): Payer: Managed Care, Other (non HMO) | Admitting: Physician Assistant

## 2021-06-06 VITALS — BP 133/95 | HR 97 | Temp 98.2°F | Resp 18 | Ht 71.0 in | Wt 162.0 lb

## 2021-06-06 DIAGNOSIS — J189 Pneumonia, unspecified organism: Secondary | ICD-10-CM

## 2021-06-06 NOTE — Progress Notes (Signed)
? ?New Patient Office Visit ? ?Subjective   ? ?Patient ID: Stephen Lee, male    DOB: 08-18-1990  Age: 31 y.o. MRN: [id number] ? ?CC:  ?Chief Complaint  ?Patient presents with  ? Hospitalization Follow-up  ? ? ?HPI ?Stephen Lee reports that he was recently seen in the emergency department May 21, 2021.  Hospital course ?Stephen Lee is a 31 y.o. male. ?Presents the ED with a chief complaint of shortness of breath.  States that he started noticing it about a week ago.  It was usually present with exertion.  He says that he primarily works at home and is at a desk a lot so he has not really noticing it too much.  Says over the past 2 days the shortness of breath has acutely worsened.  He stepped outside yesterday to go for a walk and felt like he could barely get anywhere without being extremely dyspneic.  He says the only time he had chest pain is when he has taken a really really deep breath or if he coughs.  This is in the upper center chest.  He denies any back pain, abdominal pain, nausea, vomiting, fevers, leg swelling, cough, hemoptysis, upper respiratory symptoms.  He has no history of blood clots.  Not on any medications.  No recent changes.  He does not feel wheezy.  No history of asthma or smoking.  ? ?  ?MDM  ?This is a 31 y.o. male who presents to the ED with shortness of breath ?The differential of this patient includes but is not limited to CHF, ACS, COPD, Asthma, PNA, Anaphylaxis, PE, PTX, Anxiety, Viral PNA, Bronchitis, acidosis, pneumothorax, pleural effusion, pulmonary HTN. ?  ?My Impression, Plan, and ED Course: Patient is fairly tachypneic with me.  He has a pulse rate to 126.  Temperature 99.6.  Hemodynamically stable. ?Without history of asthma, COPD, or smoking.  No wheezing on exam.  Doubt exasperation of COPD or asthma.  Not consistent with anaphylaxis.  There are no upper respiratory symptoms or infectious symptoms to suggest pneumonia.  ACS less likely, but there is some chest pain with  deep breaths.  No history of CHF or leg swelling.  Doubt this.  Will order labs, chest x-ray to rule out pneumo thorax.  Wells score 4.5 (moderate risk). Dimer sent off.  May require CTA chest for PE study. ?  ?I personally ordered, reviewed, and interpreted all laboratory work and imaging and agree with radiologist interpretation. Results interpreted below:  ?CBC unremarkable, CMP with mild AST elevation to 43, overall unremarkable. Troponin 9. BNP normal. Dimer normal. COVID/Flu negative. UA unremarkable. ?CXR with no pneumo. On my interpretation it looks like a hazy infiltrate in the RLL. Radiologist did not comment on this.  ?EKG with ST and possible LVH. NO ischemic findings. ?We obtained CTA to assess for PE prior to getting dimer back due to issues obtaining dimer at this facility today. Felt that we did not have another good explanation for patient's symptoms and tachycardia. ?CTA chest: ?1. Patchy ground-glass opacities bilaterally, basilar predominant.  ?Differential includes atypical pneumonias such as viral or fungal,  ?interstitial pneumonias, edema related to volume overload/CHF,  ?chronic interstitial diseases, hypersensitivity pneumonitis, and  ?respiratory bronchiolitis. COVID pneumonia can have this appearance.  ?2. No pulmonary embolism seen.  ?  ?CTA with ground glass opacities bilaterally. No pulmonary embolism. Ddx is broad for this. I think it is unlikely patient has CHF with normal BNP and not clinically evident. With acute presentation,  more likely to be infectious than interstitial lung disease.  ?  ?On reassessment, patient tachycardia has started to improve to aboud 100 bmp so there is likely some element of hypovolemia. I discussed results and now he tells me that he has had a cough and congestion which he did not disclose before. At this point, I think it is likely that he has some type of atypical pneumonia. Given initial sick presentation, will initiate broad antibiotics with  Augmentin and Azithro. Return precautions provided if no improvement. ? ?States today he is doing much better, has completed all medication, states that he continues to work on his breathing exercises.  Does request paperwork completed on his behalf due to missing work. ? ?Outpatient Encounter Medications as of 06/06/2021  ?Medication Sig  ? benzonatate (TESSALON) 100 MG capsule Take 1 capsule (100 mg total) by mouth every 8 (eight) hours.  ? fluticasone (FLONASE) 50 MCG/ACT nasal spray Place 2 sprays into both nostrils daily.  ? ondansetron (ZOFRAN ODT) 4 MG disintegrating tablet Take 1 tablet (4 mg total) by mouth every 8 (eight) hours as needed for nausea or vomiting.  ? oseltamivir (TAMIFLU) 75 MG capsule Take 1 capsule (75 mg total) by mouth every 12 (twelve) hours.  ? ?No facility-administered encounter medications on file as of 06/06/2021.  ? ? ?Past Medical History:  ?Diagnosis Date  ? Attention deficit   ? Condyloma acuminatum in male   ? perianal & anal canal  ? Depression   ? ? ?History reviewed. No pertinent surgical history. ? ?History reviewed. No pertinent family history. ? ?Social History  ? ?Socioeconomic History  ? Marital status: Single  ?  Spouse name: Not on file  ? Number of children: Not on file  ? Years of education: Not on file  ? Highest education level: Not on file  ?Occupational History  ? Not on file  ?Tobacco Use  ? Smoking status: Never  ? Smokeless tobacco: Never  ?Vaping Use  ? Vaping Use: Never used  ?Substance and Sexual Activity  ? Alcohol use: Yes  ? Drug use: No  ? Sexual activity: Yes  ?  Birth control/protection: None  ?Other Topics Concern  ? Not on file  ?Social History Narrative  ? Not on file  ? ?Social Determinants of Health  ? ?Financial Resource Strain: Not on file  ?Food Insecurity: Not on file  ?Transportation Needs: Not on file  ?Physical Activity: Not on file  ?Stress: Not on file  ?Social Connections: Not on file  ?Intimate Partner Violence: Not on file  ? ? ?Review  of Systems  ?Constitutional:  Negative for chills and fever.  ?HENT: Negative.    ?Eyes: Negative.   ?Respiratory:  Negative for cough and shortness of breath.   ?Cardiovascular:  Negative for chest pain.  ?Gastrointestinal: Negative.   ?Genitourinary: Negative.   ?Musculoskeletal: Negative.   ?Skin: Negative.   ?Neurological: Negative.   ?Endo/Heme/Allergies: Negative.   ?Psychiatric/Behavioral: Negative.    ? ?  ? ? ?Objective   ? ?BP (!) 133/95 (BP Location: Left Arm, Patient Position: Sitting, Cuff Size: Normal)   Pulse 97   Temp 98.2 ?F (36.8 ?C) (Oral)   Resp 18   Ht 5\' 11"  (1.803 m)   Wt 162 lb (73.5 kg)   SpO2 96%   BMI 22.59 kg/m?  ? ?Physical Exam ?Vitals and nursing note reviewed.  ?Constitutional:   ?   Appearance: Normal appearance.  ?HENT:  ?   Head: Normocephalic and  atraumatic.  ?   Right Ear: External ear normal.  ?   Left Ear: External ear normal.  ?   Nose: Nose normal.  ?   Mouth/Throat:  ?   Mouth: Mucous membranes are moist.  ?   Pharynx: Oropharynx is clear.  ?Eyes:  ?   Extraocular Movements: Extraocular movements intact.  ?   Conjunctiva/sclera: Conjunctivae normal.  ?   Pupils: Pupils are equal, round, and reactive to light.  ?Cardiovascular:  ?   Rate and Rhythm: Normal rate and regular rhythm.  ?   Pulses: Normal pulses.  ?   Heart sounds: Normal heart sounds.  ?Pulmonary:  ?   Effort: Pulmonary effort is normal.  ?   Breath sounds: Normal breath sounds. No wheezing.  ?Musculoskeletal:     ?   General: Normal range of motion.  ?   Cervical back: Normal range of motion and neck supple.  ?Skin: ?   General: Skin is warm and dry.  ?Neurological:  ?   General: No focal deficit present.  ?   Mental Status: He is alert and oriented to person, place, and time.  ?Psychiatric:     ?   Mood and Affect: Mood normal.     ?   Behavior: Behavior normal.     ?   Thought Content: Thought content normal.     ?   Judgment: Judgment normal.  ? ? ?Assessment & Plan:  ? ?Problem List Items Addressed  This Visit   ?None ?Visit Diagnoses   ? ? Atypical pneumonia    -  Primary  ? ?  ? ?1. Atypical pneumonia resolved.  Paperwork filled out on patient's behalf patient encouraged to continue breathing exercises as need

## 2021-06-06 NOTE — Patient Instructions (Signed)
Please let us know if there is any else we can do for you ? ?Roney Jaffe, PA-C ?Physician Assistant ?[facility] ?[email or web address] ? ?Health Maintenance, Male ?Adopting a healthy lifestyle and getting preventive care are important in promoting health and wellness. Ask your health care provider about: ?The right schedule for you to have regular tests and exams. ?Things you can do on your own to prevent diseases and keep yourself healthy. ?What should I know about diet, weight, and exercise? ?Eat a healthy diet ? ?Eat a diet that includes plenty of vegetables, fruits, low-fat dairy products, and lean protein. ?Do not eat a lot of foods that are high in solid fats, added sugars, or sodium. ?Maintain a healthy weight ?Body mass index (BMI) is a measurement that can be used to identify possible weight problems. It estimates body fat based on height and weight. Your health care provider can help determine your BMI and help you achieve or maintain a healthy weight. ?Get regular exercise ?Get regular exercise. This is one of the most important things you can do for your health. Most adults should: ?Exercise for at least 150 minutes each week. The exercise should increase your heart rate and make you sweat (moderate-intensity exercise). ?Do strengthening exercises at least twice a week. This is in addition to the moderate-intensity exercise. ?Spend less time sitting. Even light physical activity can be beneficial. ?Watch cholesterol and blood lipids ?Have your blood tested for lipids and cholesterol at 32 years of age, then have this test every 5 years. ?You may need to have your cholesterol levels checked more often if: ?Your lipid or cholesterol levels are high. ?You are older than 32 years of age. ?You are at high risk for heart disease. ?What should I know about cancer screening? ?Many types of cancers can be detected early and may often be prevented. Depending  on your health history and family history, you may need to have cancer screening at various ages. This may include screening for: ?Colorectal cancer. ?Prostate cancer. ?Skin cancer. ?Lung cancer. ?What should I know about heart disease, diabetes, and high blood pressure? ?Blood pressure and heart disease ?High blood pressure causes heart disease and increases the risk of stroke. This is more likely to develop in people who have high blood pressure readings or are overweight. ?Talk with your health care provider about your target blood pressure readings. ?Have your blood pressure checked: ?Every 3-5 years if you are 82-36 years of age. ?Every year if you are 23 years old or older. ?If you are between the ages of 65 and 54 and are a current or former smoker, ask your health care provider if you should have a one-time screening for abdominal aortic aneurysm (AAA). ?Diabetes ?Have regular diabetes screenings. This checks your fasting blood sugar level. Have the screening done: ?Once every three years after age 60 if you are at a normal weight and have a low risk for diabetes. ?More often and at a younger age if you are overweight or have a high risk for diabetes. ?What should I know about preventing infection? ?Hepatitis B ?If you have a higher risk for hepatitis B, you should be screened for this virus. Talk with your health care provider to find out if you are at risk for hepatitis B infection. ?Hepatitis C ?Blood testing is recommended for: ?Everyone born from [birth year] through 1966. ?Anyone with known risk factors for hepatitis C. ?Sexually transmitted infections (STIs) ?You should be screened each year for STIs,  including gonorrhea and chlamydia, if: ?You are sexually active and are younger than 32 years of age. ?You are older than 32 years of age and your health care provider tells you that you are at risk for this type of infection. ?Your sexual activity has changed since you were last screened, and you are at  increased risk for chlamydia or gonorrhea. Ask your health care provider if you are at risk. ?Ask your health care provider about whether you are at high risk for HIV. Your health care provider may recommend a prescription medicine to help prevent HIV infection. If you choose to take medicine to prevent HIV, you should first get tested for HIV. You should then be tested every 3 months for as long as you are taking the medicine. ?Follow these instructions at home: ?Alcohol use ?Do not drink alcohol if your health care provider tells you not to drink. ?If you drink alcohol: ?Limit how much you have to 0-2 drinks a day. ?Know how much alcohol is in your drink. In the [place] one drink equals one 12 oz bottle of beer (355 mL), one 5 oz glass of wine (148 mL), or one 1? oz glass of hard liquor (44 mL). ?Lifestyle ?Do not use any products that contain nicotine or tobacco. These products include cigarettes, chewing tobacco, and vaping devices, such as e-cigarettes. If you need help quitting, ask your health care provider. ?Do not use street drugs. ?Do not share needles. ?Ask your health care provider for help if you need support or information about quitting drugs. ?General instructions ?Schedule regular health, dental, and eye exams. ?Stay current with your vaccines. ?Tell your health care provider if: ?You often feel depressed. ?You have ever been abused or do not feel safe at home. ?Summary ?Adopting a healthy lifestyle and getting preventive care are important in promoting health and wellness. ?Follow your health care provider's instructions about healthy diet, exercising, and getting tested or screened for diseases. ?Follow your health care provider's instructions on monitoring your cholesterol and blood pressure. ?This information is not intended to replace advice given to you by your health care provider. Make sure you discuss any questions you have with your health care provider. ?Document Revised: 06/20/2020  Document Reviewed: 06/20/2020 ?Elsevier Patient Education ? 2023 [employer] ? ?

## 2021-07-18 ENCOUNTER — Inpatient Hospital Stay (HOSPITAL_COMMUNITY)
Admission: EM | Admit: 2021-07-18 | Discharge: 2021-07-24 | DRG: 974 | Disposition: A | Discharge status: Home / Self Care | Payer: Managed Care, Other (non HMO) | Attending: Family Medicine | Admitting: Family Medicine

## 2021-07-18 ENCOUNTER — Emergency Department (HOSPITAL_COMMUNITY): Payer: Managed Care, Other (non HMO)

## 2021-07-18 ENCOUNTER — Other Ambulatory Visit: Payer: Self-pay

## 2021-07-18 ENCOUNTER — Encounter (HOSPITAL_COMMUNITY): Payer: Self-pay | Admitting: Emergency Medicine

## 2021-07-18 DIAGNOSIS — Z79899 Other long term (current) drug therapy: Secondary | ICD-10-CM

## 2021-07-18 DIAGNOSIS — B59 Pneumocystosis: Secondary | ICD-10-CM | POA: Diagnosis not present

## 2021-07-18 DIAGNOSIS — B20 Human immunodeficiency virus [HIV] disease: Secondary | ICD-10-CM | POA: Diagnosis present

## 2021-07-18 DIAGNOSIS — J9601 Acute respiratory failure with hypoxia: Secondary | ICD-10-CM | POA: Diagnosis present

## 2021-07-18 DIAGNOSIS — J189 Pneumonia, unspecified organism: Secondary | ICD-10-CM

## 2021-07-18 DIAGNOSIS — Z20822 Contact with and (suspected) exposure to covid-19: Secondary | ICD-10-CM | POA: Diagnosis present

## 2021-07-18 DIAGNOSIS — E876 Hypokalemia: Secondary | ICD-10-CM | POA: Diagnosis present

## 2021-07-18 MED ORDER — ACETAMINOPHEN 325 MG PO TABS
650.0000 mg | ORAL_TABLET | Freq: Once | ORAL | Status: AC
  Administered 2021-07-19: 650 mg via ORAL
  Filled 2021-07-18: qty 2

## 2021-07-18 NOTE — ED Provider Triage Note (Signed)
Emergency Medicine Provider Triage Evaluation Note  Stephen Lee , a 31 y.o. male  was evaluated in triage.  Pt complains of chest pain, shortness of breath onset yesterday.  Otherwise healthy.  Recent pneumonia but completed antibiotic course and was well until symptom onset yesterday.  He is tachypneic and tachycardic on arrival.  EMS reported temperature ranging from 99-101.3 in route.  He reports productive cough otherwise denies sinus congestion, peripheral edema, palpitations.  Review of Systems  Positive: As above Negative: As above  Physical Exam  BP (!) 156/107 (BP Location: Right Arm)   Pulse (!) 144   Temp (!) 103 F (39.4 C) (Oral)   Resp (!) 24   SpO2 (!) 82%  Gen:   Awake, no distress   Resp:  Normal effort  MSK:   Moves extremities without difficulty  Other:    Medical Decision Making  Medically screening exam initiated at 11:27 PM.  Appropriate orders placed.  Stephen Lee was informed that the remainder of the evaluation will be completed by another provider, this initial triage assessment does not replace that evaluation, and the importance of remaining in the ED until their evaluation is complete.  Notified nurse that patient needs to be roomed as soon as possible.   Marita [place], PA-C 07/18/21 [id number]

## 2021-07-18 NOTE — ED Triage Notes (Signed)
Patient arrived with EMS from home reports chest tightness with SOB , productive cough onset this week .

## 2021-07-19 ENCOUNTER — Emergency Department (HOSPITAL_COMMUNITY): Payer: Managed Care, Other (non HMO)

## 2021-07-19 ENCOUNTER — Encounter (HOSPITAL_COMMUNITY): Payer: Self-pay | Admitting: Internal Medicine

## 2021-07-19 DIAGNOSIS — J9601 Acute respiratory failure with hypoxia: Secondary | ICD-10-CM | POA: Diagnosis present

## 2021-07-19 DIAGNOSIS — B20 Human immunodeficiency virus [HIV] disease: Secondary | ICD-10-CM

## 2021-07-19 DIAGNOSIS — Z20822 Contact with and (suspected) exposure to covid-19: Secondary | ICD-10-CM | POA: Diagnosis present

## 2021-07-19 DIAGNOSIS — B59 Pneumocystosis: Secondary | ICD-10-CM

## 2021-07-19 DIAGNOSIS — Z79899 Other long term (current) drug therapy: Secondary | ICD-10-CM | POA: Diagnosis not present

## 2021-07-19 DIAGNOSIS — E876 Hypokalemia: Secondary | ICD-10-CM

## 2021-07-19 LAB — CBC WITH DIFFERENTIAL/PLATELET
Abs Immature Granulocytes: 0.07 10*3/uL (ref 0.00–0.07)
Basophils Absolute: 0 10*3/uL (ref 0.0–0.1)
Basophils Relative: 0 %
Eosinophils Absolute: 0 10*3/uL (ref 0.0–0.5)
Eosinophils Relative: 0 %
HCT: 43 % (ref 39.0–52.0)
Hemoglobin: 13.8 g/dL (ref 13.0–17.0)
Immature Granulocytes: 1 %
Lymphocytes Relative: 12 %
Lymphs Abs: 1.2 10*3/uL (ref 0.7–4.0)
MCH: 30.1 pg (ref 26.0–34.0)
MCHC: 32.1 g/dL (ref 30.0–36.0)
MCV: 93.9 fL (ref 80.0–100.0)
Monocytes Absolute: 0.7 10*3/uL (ref 0.1–1.0)
Monocytes Relative: 7 %
Neutro Abs: 7.4 10*3/uL (ref 1.7–7.7)
Neutrophils Relative %: 80 %
Platelets: 261 10*3/uL (ref 150–400)
RBC: 4.58 MIL/uL (ref 4.22–5.81)
RDW: 13.6 % (ref 11.5–15.5)
WBC: 9.4 10*3/uL (ref 4.0–10.5)
nRBC: 0 % (ref 0.0–0.2)

## 2021-07-19 LAB — I-STAT ARTERIAL BLOOD GAS, ED
Acid-Base Excess: 1 mmol/L (ref 0.0–2.0)
Bicarbonate: 25 mmol/L (ref 20.0–28.0)
Calcium, Ion: 1.22 mmol/L (ref 1.15–1.40)
HCT: 38 % — ABNORMAL LOW (ref 39.0–52.0)
Hemoglobin: 12.9 g/dL — ABNORMAL LOW (ref 13.0–17.0)
O2 Saturation: 84 %
Patient temperature: 99
Potassium: 3.8 mmol/L (ref 3.5–5.1)
Sodium: 143 mmol/L (ref 135–145)
TCO2: 26 mmol/L (ref 22–32)
pCO2 arterial: 37.3 mmHg (ref 32–48)
pH, Arterial: 7.435 (ref 7.35–7.45)
pO2, Arterial: 48 mmHg — ABNORMAL LOW (ref 83–108)

## 2021-07-19 LAB — BASIC METABOLIC PANEL
Anion gap: 12 (ref 5–15)
BUN: 6 mg/dL (ref 6–20)
CO2: 22 mmol/L (ref 22–32)
Calcium: 9 mg/dL (ref 8.9–10.3)
Chloride: 100 mmol/L (ref 98–111)
Creatinine, Ser: 1.11 mg/dL (ref 0.61–1.24)
GFR, Estimated: >60 mL/min (ref >60)
Glucose, Bld: 110 mg/dL — ABNORMAL HIGH (ref 70–99)
Potassium: 3.3 mmol/L — ABNORMAL LOW (ref 3.5–5.1)
Sodium: 134 mmol/L — ABNORMAL LOW (ref 135–145)

## 2021-07-19 LAB — PROTIME-INR
INR: 1 (ref 0.8–1.2)
Prothrombin Time: 13.1 seconds (ref 11.4–15.2)

## 2021-07-19 LAB — LACTIC ACID, PLASMA
Lactic Acid, Venous: 1.6 mmol/L (ref 0.5–1.9)
Lactic Acid, Venous: 1.5 mmol/L (ref 0.5–1.9)

## 2021-07-19 LAB — RESPIRATORY PANEL BY PCR

## 2021-07-19 LAB — HEPATIC FUNCTION PANEL
ALT: 16 U/L (ref 0–44)
AST: 34 U/L (ref 15–41)
Albumin: 3.8 g/dL (ref 3.5–5.0)
Alkaline Phosphatase: 93 U/L (ref 38–126)
Bilirubin, Direct: 0.2 mg/dL (ref 0.0–0.2)
Indirect Bilirubin: 0.5 mg/dL (ref 0.3–0.9)
Total Bilirubin: 0.7 mg/dL (ref 0.3–1.2)
Total Protein: 7.9 g/dL (ref 6.5–8.1)

## 2021-07-19 LAB — PROCALCITONIN: Procalcitonin: 0.15 ng/mL

## 2021-07-19 LAB — TROPONIN I (HIGH SENSITIVITY)
Troponin I (High Sensitivity): 10 ng/L (ref <18)
Troponin I (High Sensitivity): 7 ng/L (ref <18)

## 2021-07-19 LAB — BRAIN NATRIURETIC PEPTIDE: B Natriuretic Peptide: 10.7 pg/mL (ref 0.0–100.0)

## 2021-07-19 LAB — LACTATE DEHYDROGENASE: LDH: 301 U/L — ABNORMAL HIGH (ref 98–192)

## 2021-07-19 LAB — HIV ANTIBODY (ROUTINE TESTING W REFLEX): HIV Screen 4th Generation wRfx: REACTIVE — AB

## 2021-07-19 LAB — RESP PANEL BY RT-PCR (FLU A&B, COVID) ARPGX2
Influenza A by PCR: NEGATIVE
Influenza B by PCR: NEGATIVE
SARS Coronavirus 2 by RT PCR: NEGATIVE

## 2021-07-19 LAB — APTT: aPTT: 30 seconds (ref 24–36)

## 2021-07-19 LAB — C-REACTIVE PROTEIN: CRP: 8 mg/dL — ABNORMAL HIGH (ref <1.0)

## 2021-07-19 MED ORDER — ONDANSETRON HCL 4 MG PO TABS: 4.0000 mg | ORAL_TABLET | Freq: Four times a day (QID) | ORAL | Status: DC | PRN

## 2021-07-19 MED ORDER — SULFAMETHOXAZOLE-TRIMETHOPRIM 400-80 MG/5ML IV SOLN
320.0000 mg | Freq: Four times a day (QID) | INTRAVENOUS | Status: DC
  Administered 2021-07-19 – 2021-07-23 (×16): 320 mg via INTRAVENOUS
  Filled 2021-07-19 (×18): qty 20

## 2021-07-19 MED ORDER — LACTATED RINGERS IV BOLUS
1500.0000 mL | Freq: Once | INTRAVENOUS | Status: AC
  Administered 2021-07-19: 1500 mL via INTRAVENOUS

## 2021-07-19 MED ORDER — ENOXAPARIN SODIUM 40 MG/0.4ML IJ SOSY: 40.0000 mg | PREFILLED_SYRINGE | INTRAMUSCULAR | Status: DC

## 2021-07-19 MED ORDER — SODIUM CHLORIDE 0.9 % IV SOLN
500.0000 mg | Freq: Once | INTRAVENOUS | Status: AC
  Administered 2021-07-19: 500 mg via INTRAVENOUS
  Filled 2021-07-19: qty 5

## 2021-07-19 MED ORDER — POTASSIUM CHLORIDE IN NACL 20-0.9 MEQ/L-% IV SOLN
INTRAVENOUS | Status: DC
  Filled 2021-07-19 (×5): qty 1000

## 2021-07-19 MED ORDER — POTASSIUM CHLORIDE CRYS ER 20 MEQ PO TBCR
20.0000 meq | EXTENDED_RELEASE_TABLET | Freq: Once | ORAL | Status: AC
  Administered 2021-07-19: 20 meq via ORAL
  Filled 2021-07-19: qty 1

## 2021-07-19 MED ORDER — ONDANSETRON HCL 4 MG/2ML IJ SOLN: 4.0000 mg | Freq: Four times a day (QID) | INTRAMUSCULAR | Status: DC | PRN

## 2021-07-19 MED ORDER — ALPRAZOLAM 0.5 MG PO TABS
0.5000 mg | ORAL_TABLET | Freq: Two times a day (BID) | ORAL | Status: DC | PRN
Start: 2021-07-19 — End: 2021-07-24
  Administered 2021-07-19 – 2021-07-21 (×3): 0.5 mg via ORAL
  Filled 2021-07-19: qty 2
  Filled 2021-07-19 (×2): qty 1

## 2021-07-19 MED ORDER — PREDNISONE 20 MG PO TABS
40.0000 mg | ORAL_TABLET | Freq: Two times a day (BID) | ORAL | Status: DC
  Administered 2021-07-19 – 2021-07-20 (×3): 40 mg via ORAL
  Filled 2021-07-19 (×3): qty 2

## 2021-07-19 MED ORDER — ENOXAPARIN SODIUM 40 MG/0.4ML IJ SOSY
40.0000 mg | PREFILLED_SYRINGE | INTRAMUSCULAR | Status: DC
  Administered 2021-07-19 – 2021-07-24 (×6): 40 mg via SUBCUTANEOUS
  Filled 2021-07-19 (×6): qty 0.4

## 2021-07-19 MED ORDER — LACTATED RINGERS IV BOLUS
1000.0000 mL | Freq: Once | INTRAVENOUS | Status: AC
  Administered 2021-07-19: 1000 mL via INTRAVENOUS

## 2021-07-19 MED ORDER — POLYETHYLENE GLYCOL 3350 17 G PO PACK: 17.0000 g | PACK | Freq: Every day | ORAL | Status: DC | PRN

## 2021-07-19 MED ORDER — SODIUM CHLORIDE 0.9 % IV SOLN
1.0000 g | Freq: Once | INTRAVENOUS | Status: AC
  Administered 2021-07-19: 1 g via INTRAVENOUS
  Filled 2021-07-19: qty 10

## 2021-07-19 MED ORDER — ACETAMINOPHEN 325 MG PO TABS
650.0000 mg | ORAL_TABLET | Freq: Four times a day (QID) | ORAL | Status: DC | PRN
  Administered 2021-07-20 – 2021-07-22 (×2): 650 mg via ORAL
  Filled 2021-07-19 (×2): qty 2

## 2021-07-19 MED ORDER — SODIUM CHLORIDE 0.9 % IV SOLN
2.0000 g | INTRAVENOUS | Status: DC
  Administered 2021-07-20: 2 g via INTRAVENOUS
  Filled 2021-07-19: qty 20

## 2021-07-19 MED ORDER — ACETAMINOPHEN 650 MG RE SUPP: 650.0000 mg | Freq: Four times a day (QID) | RECTAL | Status: DC | PRN

## 2021-07-19 MED ORDER — LACTATED RINGERS IV BOLUS: 1000.0000 mL | Freq: Once | INTRAVENOUS | Status: DC

## 2021-07-19 MED ORDER — SODIUM CHLORIDE 0.9 % IV SOLN
500.0000 mg | INTRAVENOUS | Status: DC
  Administered 2021-07-20: 500 mg via INTRAVENOUS
  Filled 2021-07-19: qty 5

## 2021-07-19 MED ORDER — IOHEXOL 350 MG/ML SOLN
50.0000 mL | Freq: Once | INTRAVENOUS | Status: AC | PRN
  Administered 2021-07-19: 50 mL via INTRAVENOUS

## 2021-07-19 NOTE — Assessment & Plan Note (Signed)
   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

## 2021-07-19 NOTE — Assessment & Plan Note (Signed)
   Patient presenting with rapidly progressive shortness of breath with associated hypoxia, now requiring 3 L of oxygen via nasal cannula.    Patient has been suffering from ongoing shortness of breath and cough for the past several weeks that had a brief reprieve after a course of Augmentin and azithromycin.  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 Drue Second with infectious disease who will come see in consultation.  She recommends obtaining an LDH, Legionella antigen, pneumococcus pneumoniae antigen and Fungitell  Continue to provide supplemental oxygen as necessary to achieve oxygen saturations of between 92 to 96%  Blood cultures have additionally been obtained  Hydrating patient with intravenous isotonic fluids

## 2021-07-19 NOTE — Assessment & Plan Note (Signed)
·   Replacing with potassium chloride °· Evaluating for concurrent hypomagnesemia  °· Monitoring potassium levels with serial chemistries. ° °

## 2021-07-19 NOTE — ED Provider Notes (Signed)
[facility]   CSN: [id number] Arrival date & time: 07/18/21  2326     History  Chief Complaint  Patient presents with   SOB / Chest Tightness    Stephen Loleta ChanceHill is a 31 y.o. male who presents to the emergency department with complaints of progressively worsening shortness of breath for the past 4 days.  Patient reports that he initially developed a dry cough, subsequently became short of breath, then developed chest pain.  His chest pain is intermittent, worse with deep breathing, no alleviating factors.  States his symptoms feel somewhat similar to prior pneumonia however they feel much worse.  He did recently have pneumonia in April, took antibiotics with improvement.  He states he feels very anxious and is trying to calm down.  He is having palpitations.  He denies fever, chills, nausea, vomiting, leg pain/swelling, hemoptysis, recent surgery/trauma, recent long travel, hormone use, personal hx of cancer, or hx of DVT/PE.  Denies IV drug use.  Denies recent foreign travel.  He denies history of smoking or asthma.   HPI     Home Medications Prior to Admission medications   Medication Sig Start Date End Date Taking? Authorizing Provider  benzonatate (TESSALON) 100 MG capsule Take 1 capsule (100 mg total) by mouth every 8 (eight) hours. 03/23/18   Joy, Shawn C, PA-C  fluticasone (FLONASE) 50 MCG/ACT nasal spray Place 2 sprays into both nostrils daily. 03/23/18   Joy, Shawn C, PA-C  ondansetron (ZOFRAN ODT) 4 MG disintegrating tablet Take 1 tablet (4 mg total) by mouth every 8 (eight) hours as needed for nausea or vomiting. 03/04/19   Rancour, Jeannett SeniorStephen, MD  oseltamivir (TAMIFLU) 75 MG capsule Take 1 capsule (75 mg total) by mouth every 12 (twelve) hours. 03/23/18   Joy, Shawn C, PA-C      Allergies    Patient has no known allergies.    Review of Systems   Review of Systems  Constitutional:  Negative for chills and fever.  Respiratory:   Positive for cough and shortness of breath.   Cardiovascular:  Positive for chest pain and palpitations.  Gastrointestinal:  Positive for diarrhea. Negative for abdominal pain, nausea and vomiting.  Neurological:  Negative for syncope.  All other systems reviewed and are negative.  Physical Exam Updated Vital Signs BP (!) 155/109   Pulse (!) 135   Temp (!) 103 F (39.4 C) (Oral)   Resp (!) 23   SpO2 95%  Physical Exam Vitals and nursing note reviewed.  Constitutional:      Appearance: He is well-developed. He is not ill-appearing or toxic-appearing.  HENT:     Head: Normocephalic and atraumatic.  Eyes:     General:        Right eye: No discharge.        Left eye: No discharge.     Conjunctiva/sclera: Conjunctivae normal.  Cardiovascular:     Rate and Rhythm: Regular rhythm. Tachycardia present.  Pulmonary:     Effort: Tachypnea present.     Breath sounds: No wheezing or rales.     Comments: Hypoxic into the 80s on room air, currently on 5 L via nasal cannula satting 94%. Chest:     Chest wall: No tenderness.  Abdominal:     General: There is no distension.     Palpations: Abdomen is soft.     Tenderness: There is no abdominal tenderness. There is no guarding or rebound.  Musculoskeletal:  Cervical back: Neck supple.     Right lower leg: No edema.     Left lower leg: No edema.  Skin:    General: Skin is warm and dry.  Neurological:     Mental Status: He is alert.     Comments: Clear speech.   Psychiatric:        Mood and Affect: Mood is anxious.        Behavior: Behavior normal.    ED Results / Procedures / Treatments   Labs (all labs ordered are listed, but only abnormal results are displayed) Labs Reviewed  BASIC METABOLIC PANEL - Abnormal; Notable for the following components:      Result Value   Sodium 134 (*)    Potassium 3.3 (*)    Glucose, Bld 110 (*)    All other components within normal limits  CULTURE, BLOOD (ROUTINE X 2)  CULTURE, BLOOD  (ROUTINE X 2)  CBC WITH DIFFERENTIAL/PLATELET  LACTIC ACID, PLASMA  LACTIC ACID, PLASMA  PROTIME-INR  APTT  HEPATIC FUNCTION PANEL  TROPONIN I (HIGH SENSITIVITY)  TROPONIN I (HIGH SENSITIVITY)    EKG None  Radiology DG Chest 2 View  Result Date: 07/19/2021 CLINICAL DATA:  Dyspnea. Shortness of breath and chest tightness. Oxygen requirement. EXAM: CHEST - 2 VIEW COMPARISON:  Radiograph and CT 05/21/2021 FINDINGS: Low lung volumes. Progressive min patchy bibasilar airspace disease from prior imaging. The heart is normal in size with normal mediastinal contours. There is no pleural effusion or pneumothorax. No acute osseous abnormalities. Incidental bilateral cervical ribs. IMPRESSION: Low lung volumes with progressive patchy bibasilar airspace disease from prior imaging, suspicious for pneumonia. Electronically Signed   By: Narda Rutherford M.D.   On: 07/19/2021 00:02   CT Angio Chest PE W/Cm &/Or Wo Cm  Result Date: 07/19/2021 CLINICAL DATA:  Chest tightness and shortness of breath with productive cough, initial encounter EXAM: CT ANGIOGRAPHY CHEST WITH CONTRAST TECHNIQUE: Multidetector CT imaging of the chest was performed using the standard protocol during bolus administration of intravenous contrast. Multiplanar CT image reconstructions and MIPs were obtained to evaluate the vascular anatomy. RADIATION DOSE REDUCTION: This exam was performed according to the departmental dose-optimization program which includes automated exposure control, adjustment of the mA and/or kV according to patient size and/or use of iterative reconstruction technique. CONTRAST:  50mL OMNIPAQUE IOHEXOL 350 MG/ML SOLN COMPARISON:  Chest x-ray from earlier in the same day. FINDINGS: Cardiovascular: Heart is enlarged in size. Thoracic aorta shows no aneurysmal dilatation or dissection. Pulmonary artery shows a normal branching pattern without definitive filling defect to suggest pulmonary embolism. Opacification in the  right lower lobe particularly is somewhat limited due to adjacent infiltrate. Mediastinum/Nodes: Thoracic inlet is within normal limits. No hilar or mediastinal adenopathy is noted. The esophagus is within normal limits. Lungs/Pleura: Lungs are well aerated bilaterally with the exception of the lower lobes with patchy infiltrate right slightly greater than left identified consistent with early infiltrate. Patchy ground-glass opacities are noted throughout the remaining lungs which may represent postinflammatory change is well. These have increased in the interval from the prior exam. Upper Abdomen: Visualized upper abdomen is unremarkable. Musculoskeletal: No acute bony abnormality is noted. Review of the MIP images confirms the above findings. IMPRESSION: No evidence of pulmonary emboli. Bibasilar infiltrates right greater than left consistent with developing pneumonia. Increase in generalized ground-glass opacities throughout both lungs as previously described. Correlation with laboratory values is recommended as COVID pneumonia could present in this fashion. Electronically Signed   By:  Alcide Clever M.D.   On: 07/19/2021 02:17    Procedures .Critical Care Performed by: Cherly Anderson, PA-C Authorized by: Cherly Anderson, PA-C    CRITICAL CARE Performed by: Harvie Heck   Total critical care time: 45 minutes  Critical care time was exclusive of separately billable procedures and treating other patients.  Critical care was necessary to treat or prevent imminent or life-threatening deterioration.  Critical care was time spent personally by me on the following activities: development of treatment plan with patient and/or surrogate as well as nursing, discussions with consultants, evaluation of patient's response to treatment, examination of patient, obtaining history from patient or surrogate, ordering and performing treatments and interventions, ordering and review of laboratory  studies, ordering and review of radiographic studies, pulse oximetry and re-evaluation of patient's condition.    Medications Ordered in ED Medications  cefTRIAXone (ROCEPHIN) 1 g in sodium chloride 0.9 % 100 mL IVPB (1 g Intravenous New Bag/Given 07/19/21 0112)  azithromycin (ZITHROMAX) 500 mg in sodium chloride 0.9 % 250 mL IVPB (has no administration in time range)  acetaminophen (TYLENOL) tablet 650 mg (650 mg Oral Given 07/19/21 0050)  lactated ringers bolus 1,000 mL (1,000 mLs Intravenous New Bag/Given 07/19/21 0053)    ED Course/ Medical Decision Making/ A&P                           Medical Decision Making Amount and/or Complexity of Data Reviewed Labs: ordered. Radiology: ordered.  Risk Prescription drug management. Decision regarding hospitalization.   Patient presents to the emergency department with chest pain. Patient nontoxic appearing, in no apparent distress, vitals with fever, tachycardia, tachypnea, and hypoxia requiring currently 5 L via nasal cannula.Marland Kitchen lungs CTA.   DDX including but not limited to: ACS, pulmonary embolism, dissection, pneumothorax, pneumonia, arrhythmia, severe anemia, MSK, GERD, anxiety, abdominal process, endocarditis, septic emboli.   Additional history obtained:  Chart & nursing note reviewed.  External records reviewed, seen in the ED 05/21/2021, found to have pneumonia, treated with Augmentin and azithromycin.  Seen in follow-up by PCP 06/06/2021 and had improved.  EKG: Sinus tachycardia.   Lab Tests:  I reviewed & interpreted labs including:  CBC: unremarkable BMP: mild hypokalemia.  PT/INR/APTT: WNL Lactic: WNL Troponin: WNL Covid/flu: Negative  Imaging Studies ordered:  I ordered and viewed the following imaging, agree with radiologist impression:  CXR:  Low lung volumes with progressive patchy bibasilar airspace disease from prior imaging, suspicious for pneumonia CTA: No evidence of pulmonary emboli. Bibasilar infiltrates right  greater than left consistent with developing pneumonia. Increase in generalized ground-glass opacities throughout both lungs as previously described. Correlation with laboratory values is recommended as COVID pneumonia could present in this fashio  ED Course:  Patient w/ findings of pneumonia on CXR/CTA, no PE, labs fairly reassuring. Meets SIRS criteria, receiving abx & fluids, tylenol was given for fever, remains tachycardic, now on 2L via [place] maintaining SpO2 of 95%, RR on my exam consistently 18-25 despite documented in the 40s on automated monitor. Will discuss w/ medicine for admission, patient is in agreement.   04:42: CONSULT: Discussed with hospitalist Dr. Leafy Half- accepts admission.    Portions of this note were generated with Scientist, clinical (histocompatibility and immunogenetics). Dictation errors may occur despite best attempts at proofreading.  Final Clinical Impression(s) / ED Diagnoses Final diagnoses:  Multifocal pneumonia  Acute hypoxemic respiratory failure (HCC)    Rx / DC Orders ED Discharge Orders  None         Cherly Anderson, PA-C 07/19/21 0507    Mesner, Barbara Cower, MD 07/19/21 [phone]

## 2021-07-19 NOTE — Progress Notes (Signed)
PROGRESS NOTE  Stephen Lee  DOB: 11/29/1990  PCP: System, Provider Not In [id number]  DOA: 07/18/2021  LOS: 0 days  Hospital Day: 2  Brief narrative: Stephen Lee is a 31 y.o. homosexual male who presented to the ED on 6/6 with complaint of chest tightness, shortness of breath. 4/9, patient was seen at ED for shortness of breath, cough for a week.  He was diagnosed with bilateral atypical pneumonia and was discharged home on Augmentin and azithromycin.  His symptoms improved at that time. About 5 days ago, patient started having dry cough, shortness of breath and chest discomfort with exertion which progressed and hence patient presented to the ED.  In the ED, he had a fever of 103, heart rate elevated to 144, blood pressure 156/107, O2 sat 82% on room air, and required 3 L oxygen by nasal cannula. CBC unremarkable, BMP with low sodium at 134, low potassium 3.3, LFTs normal, lactic acid level normal Blood gas with pH 7.43, PCO2 37, PO2 low at 48 HIV screening test came reactive. LDH elevated to 3 1, CRP elevated 8. CT angio chest rule out pulm embolism but showed bilateral lower lobe groundglass infiltrates. Patient was started on broad-spectrum antibiotics Hospitalist service was consulted for inpatient admission and management.  Subjective: Patient was seen and examined this afternoon.  Pleasant young African-American male.  Propped up in bed.  On 3 L oxygen by nasal cannula. No shortness of breath at rest, on oxygen. He was not previously aware of him having HIV until this morning.  He does not want his mom to know that yet.  Principal Problem:   Acute respiratory failure with hypoxia (HCC) Active Problems:   Pneumocystis jiroveci pneumonia (HCC)   Hypokalemia   HIV (human immunodeficiency virus infection) (HCC)    Assessment and plan: Bilateral pneumonia New diagnosis of HIV and homosexual male -Presented with worsening shortness of breath, chest tightness  -CT chest  as above. -High suspicion of PCP pneumonia -Admitting physician started on Bactrim -Per ID recommendation, sent for CD4, viral load, LDH, Legionella antigen, pneumococcus pneumoniae antigen and Fungitell  Acute respiratory failure with hypoxia -Blood gas with PaO2 less than 70. -Started on prednisone.   Hypokalemia -Level improved with replacement. Recent Labs  Lab 07/18/21 2335 07/19/21 1102  K 3.3* 3.8   Goals of care   Code Status: Full Code    Mobility: Encourage ambulation when oxygenation improves  Skin assessment:     Nutritional status:  Body mass index is 23.71 kg/m.          Diet:  Diet Order             Diet regular Room service appropriate? Yes; Fluid consistency: Thin  Diet effective now                   DVT prophylaxis:  enoxaparin (LOVENOX) injection 40 mg Start: 07/19/21 1000   Antimicrobials: Per ID Fluid: NS with potassium at 75 mill per hour Consultants: ID Family Communication: Mom at bedside  Status is: Inpatient  Continue in-hospital care because: Undergoing treatment for pneumonia, significant oxygen requirement Level of care: Telemetry Medical   Dispo: The patient is from: Home              Anticipated d/c is to: Home in 2 to 3 days              Patient currently is not medically stable to d/c.   Difficult to place patient No  Infusions:   0.9 % NaCl with KCl 20 mEq / L 75 mL/hr at 07/19/21 1107   [START ON 07/20/2021] azithromycin     [START ON 07/20/2021] cefTRIAXone (ROCEPHIN)  IV     sulfamethoxazole-trimethoprim 320 mg of trimethoprim (07/19/21 1237)    Scheduled Meds:  enoxaparin (LOVENOX) injection  40 mg Subcutaneous Q24H   predniSONE  40 mg Oral BID    PRN meds: acetaminophen **OR** acetaminophen, ALPRAZolam, ondansetron **OR** ondansetron (ZOFRAN) IV, polyethylene glycol   Antimicrobials: Anti-infectives (From admission, onward)    Start     Dose/Rate Route Frequency Ordered Stop   07/20/21 0600   cefTRIAXone (ROCEPHIN) 2 g in sodium chloride 0.9 % 100 mL IVPB        2 g 200 mL/hr over 30 Minutes Intravenous Every 24 hours 07/19/21 0601 07/25/21 0559   07/20/21 0600  azithromycin (ZITHROMAX) 500 mg in sodium chloride 0.9 % 250 mL IVPB        500 mg 250 mL/hr over 60 Minutes Intravenous Every 24 hours 07/19/21 0601 07/24/21 0559   07/19/21 1200  sulfamethoxazole-trimethoprim (BACTRIM) 320 mg of trimethoprim in dextrose 5 % 500 mL IVPB        320 mg of trimethoprim 346.7 mL/hr over 90 Minutes Intravenous Every 6 hours 07/19/21 1124     07/19/21 0045  cefTRIAXone (ROCEPHIN) 1 g in sodium chloride 0.9 % 100 mL IVPB        1 g 200 mL/hr over 30 Minutes Intravenous  Once 07/19/21 0031 07/19/21 0202   07/19/21 0045  azithromycin (ZITHROMAX) 500 mg in sodium chloride 0.9 % 250 mL IVPB        500 mg 250 mL/hr over 60 Minutes Intravenous  Once 07/19/21 0031 07/19/21 0322       Objective: Vitals:   07/19/21 0815 07/19/21 0845  BP: (!) 146/106   Pulse: (!) 114   Resp: (!) 27   Temp:  99 F (37.2 C)  SpO2: 96% 98%    Intake/Output Summary (Last 24 hours) at 07/19/2021 1313 Last data filed at 07/19/2021 0542 Gross per 24 hour  Intake --  Output 1500 ml  Net -1500 ml   Filed Weights   07/19/21 1100  Weight: 77.1 kg   Weight change:  Body mass index is 23.71 kg/m.   Physical Exam: General exam: Pleasant, young African-American male.  On supplemental oxygen Skin: No rashes, lesions or ulcers. HEENT: Atraumatic, normocephalic, no obvious bleeding Lungs: Clear to auscultation bilaterally CVS: Regular rate and rhythm, no murmur GI/Abd soft, nontender, nondistended, bowel sound present CNS: Alert, awake, oriented x3 Psychiatry: Sad affect Extremities: No pedal edema, no calf tenderness  Data Review: I have personally reviewed the laboratory data and studies available.  F/u labs ordered Unresulted Labs (From admission, onward)     Start     Ordered   07/20/21 0500   Comprehensive metabolic panel  Tomorrow morning,   R        07/19/21 0601   07/20/21 0500  CBC with Differential/Platelet  Tomorrow morning,   R        07/19/21 0601   07/20/21 0500  Magnesium  Tomorrow morning,   R        07/19/21 0601   07/19/21 1042  Fungitell, Serum  Once,   R        07/19/21 1041   07/19/21 1019  HIV-1 RNA quant-no reflex-bld  Once,   R        07/19/21 1018  07/19/21 1019  T-helper cells (CD4) count (not at [facility])  Once,   R        07/19/21 1018   07/19/21 1019  Pneumocystis smear by DFA  Once,   R        07/19/21 1019   07/19/21 1015  Blood gas, arterial  Once,   R       Question:  Room air or oxygen  Answer:  Room air   07/19/21 1014   07/19/21 0600  Legionella Pneumophila Serogp 1 Ur Ag  (COPD / Pneumonia / Cellulitis / Lower Extremity Wound)  Once,   R        07/19/21 0601   07/19/21 0600  Strep pneumoniae urinary antigen  (COPD / Pneumonia / Cellulitis / Lower Extremity Wound)  Once,   R        07/19/21 0601   07/19/21 0559  Expectorated Sputum Assessment w Gram Stain, Rflx to Resp Cult  (COPD / Pneumonia / Cellulitis / Lower Extremity Wound)  Once,   R        07/19/21 0601   07/19/21 0307  HIV-1/2 AB - differentiation  Once,   R        07/19/21 0307   07/19/21 0029  Blood Culture (routine x 2)  (Undifferentiated presentation (screening labs and basic nursing orders))  BLOOD CULTURE X 2,   STAT      07/19/21 0029            Signed, Lorin Glass, MD [facility] 07/19/2021

## 2021-07-19 NOTE — ED Notes (Signed)
Pulled initial labs from EMS line pt has PTA, requested lab to come get blood cultures, hooked pt to monitors

## 2021-07-19 NOTE — ED Notes (Signed)
PT OFF UNIT TO CT.

## 2021-07-19 NOTE — Assessment & Plan Note (Signed)
·   Please see assessment and plan above °

## 2021-07-19 NOTE — Sepsis Progress Note (Signed)
Following for sepsis monitoring ?

## 2021-07-19 NOTE — ED Notes (Signed)
The pt was just moved to yellow  on arrival r rapid 33  on nasal os  he is on the phone inable to talk with yet  mother at the bedside

## 2021-07-19 NOTE — Progress Notes (Addendum)
Pharmacy Antibiotic Note  Stephen Lee is a 31 y.o. male admitted on 07/18/2021 with PCP pneumonia.  Pharmacy has been consulted for Sulfamethoxazole-trimethoprim dosing.  WBC 9.4, Tmax: 103, LA 1.5 Scr 1.11 Ceftriaxone and Azithromycin started empirically in the ED for pneumonia on 6/7  Plan: Start sulfamethoxazole-trimethoprim 320mg  IV q6h Continue ceftriaxone and azithromycin per MD Monitor daily WBC, temp, SCr, and clinical s/sx of infection.  Height: 5\' 11"  (180.3 cm) Weight: 77.1 kg (170 lb) IBW/kg (Calculated) : 75.3  Temp (24hrs), Avg:101 F (38.3 C), Min:99 F (37.2 C), Max:103 F (39.4 C)  Recent Labs  Lab 07/18/21 2335 07/19/21 0036 07/19/21 0307  WBC 9.4  --   --   CREATININE 1.11  --   --   LATICACIDVEN  --  1.6 1.5    Estimated Creatinine Clearance: 101.8 mL/min (by C-G formula based on SCr of 1.11 mg/dL).    No Known Allergies  Antimicrobials this admission: Ceftriaxone 6/7 >>  Azithromycin 6/7 >>  SMX-TMP 6/7 >>   Dose adjustments this admission: N/A  Microbiology results: 6/7 BCx: pending 6/7 Sputum: pending  6/7 pneumocystis smear by DFA: pending  Thank you for allowing pharmacy to be a part of this patient's care.  8/7 07/19/2021 11:25 AM

## 2021-07-19 NOTE — ED Notes (Signed)
Lab at bedside collecting blood cultures.

## 2021-07-19 NOTE — Consult Note (Signed)
[facility]    Date of Admission:  07/18/2021     Total days of antibiotics                Reason for Consult: Positive HIV test / PCP   Referring Provider: Dr. Cyd Silence  Primary Care Provider: System, Provider Not In   ASSESSMENT: 31yo M with Acute respiratory failure/hypoxia with bilateral patchy infiltrate concerning for atypical pneumonia, including viral pneumonia, pneumocystisis. Preliminary hiv ab testing is positive, confirmation/viral load pending. RF is MSM, has been in same relationship x 3 yrs, partner lives with him.   PLAN:  For confirmation of HIV positive test = will check CD 4 count, HIV VL, RPR and hep B ab. Did not discuss in detail with the patient about hiv diagnosis or management since mother was at bedside. Will discuss results when he is alone first tomorrow. For pneumonia = please check RVP, ur legionella, ur strep pneumoniae, fungitell and Pneumocystis stain. Would treat for presumed pjp with iv bactrim plus steroids in addition to CAP coverage of ceftriaxone plus azithromycin Hypoxia = titrate up oxygen up to 6L during interview but suspect he may tire out due to acc muscle use and tachypnea, did mentionto him that sometimes these pneumonia cases do require intubation for which he and his mother understood High risk medication = will check kidney function while he is on iv bactrim to appropriately dose.    Principal Problem:   Acute respiratory failure with hypoxia (HCC) Active Problems:   Hypokalemia   Pneumocystis jiroveci pneumonia (HCC)   HIV (human immunodeficiency virus infection) ([place])    enoxaparin (LOVENOX) injection  40 mg Subcutaneous Q24H   predniSONE  40 mg Oral BID     HPI: Stephen Lee is a 31 y.o. male with previous medical history of depression presented to the ED with chest tightness and shortness of breath with one week history of cough.   Stephen Lee was in his usual state of health until about 4 days  prior to presentation when he developed a dry cough and subsequent worsening shortness of breath and chest pain. Previously diagnosed with pneumonia in April which resolved with antibiotics. Chest x-ray with low lung volumes with progressive patchy bibasilar airspace disease. CT angio chest with no pulmonary edema and bibasilar infiltrates right great than left consistent with pneumonia; and ground-glass opacities present. Febrile with temperature of 103 and hypoxic with SP02 of 80% on room air. ABG with pH 7.43, PCO2 37, and PO2 at 48. LDH elevated at 301. Initial HIV screening is positive with confirmatory testing pending. There is high suspicion for PCP pneumonia.   Sochx: lives with his boyfriend of 3-25yrs. 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 Mother at bedside with him. No smoking or drinking or illicit drugs.  Review of Systems:  Constitutional: Negative for fever, chills, diaphoresis, activity change, appetite change, fatigue and unexpected weight change.  HENT: Negative for congestion, sore throat, rhinorrhea, sneezing, trouble swallowing and sinus pressure.  Eyes: Negative for photophobia and visual disturbance.  Respiratory: positive for cough, chest tightness, shortness of breath, wheezing and stridor.  Cardiovascular: Negative for chest pain, palpitations and leg swelling.  Gastrointestinal: + 1 episode of diarrhea yesterday.  Negative for nausea, vomiting, abdominal pain, constipation, blood in stool, abdominal distention and anal bleeding.  Genitourinary: Negative for dysuria, hematuria, flank pain and difficulty urinating.  Musculoskeletal: Negative for myalgias, back pain, joint swelling, arthralgias and gait problem.  Skin: Negative for color change, pallor, rash and wound.  Neurological: Negative for dizziness, tremors, weakness and light-headedness.  Hematological: Negative for adenopathy. Does not bruise/bleed easily.  Psychiatric/Behavioral: Negative  for behavioral problems, confusion, sleep disturbance, dysphoric mood, decreased concentration and agitation.     Past Medical History:  Diagnosis Date   Attention deficit    Condyloma acuminatum in male    perianal & anal canal   Depression     Social History   Tobacco Use   Smoking status: Never   Smokeless tobacco: Never  Vaping Use   Vaping Use: Never used  Substance Use Topics   Alcohol use: Yes   Drug use: No    Family hx: DM, and HTN.  No Known Allergies  OBJECTIVE: Blood pressure (!) 148/100, pulse (!) 122, temperature 99 F (37.2 C), temperature source Oral, resp. rate 18, height 5\' 11"  (1.803 m), weight 77.1 kg, SpO2 96 %.  Physical Exam  Constitutional: He is oriented to person, place, and time. He appears well-developed and well-nourished. No distress.  HENT:  Mouth/Throat: Oropharynx is clear and moist. No oropharyngeal exudate.  Cardiovascular: tachycardic, regular rhythm and normal heart sounds. Exam reveals no gallop and no friction rub.  No murmur heard.  Pulmonary/Chest: tachypnea and breath sounds +wheezing. +acc .muscle use. Abdominal: Soft. Bowel sounds are normal. He exhibits no distension. There is no tenderness.  Lymphadenopathy:  He has no cervical adenopathy.  Neurological: He is alert and oriented to person, place, and time.  Skin: Skin is warm and dry. No rash noted. No erythema.  Psychiatric: He has a normal mood and affect. His behavior is normal.    Lab Results Lab Results  Component Value Date   WBC 9.4 07/18/2021   HGB 12.9 (L) 07/19/2021   HCT 38.0 (L) 07/19/2021   MCV 93.9 07/18/2021   PLT 261 07/18/2021    Lab Results  Component Value Date   CREATININE 1.11 07/18/2021   BUN 6 07/18/2021   NA 143 07/19/2021   K 3.8 07/19/2021   CL 100 07/18/2021   CO2 22 07/18/2021    Lab Results  Component Value Date   ALT 16 07/19/2021   AST 34 07/19/2021   ALKPHOS 93 07/19/2021   BILITOT 0.7 07/19/2021      Microbiology: Recent Results (from the past 240 hour(s))  Resp Panel by RT-PCR (Flu A&B, Covid) Anterior Nasal Swab     Status: None   Collection Time: 07/19/21  3:15 AM   Specimen: Anterior Nasal Swab  Result Value Ref Range Status   SARS Coronavirus 2 by RT PCR NEGATIVE NEGATIVE Final    Comment: (NOTE) SARS-CoV-2 target nucleic acids are NOT DETECTED.  The SARS-CoV-2 RNA is generally detectable in upper respiratory specimens during the acute phase of infection. The lowest concentration of SARS-CoV-2 viral copies this assay can detect is 138 copies/mL. A negative result does not preclude SARS-Cov-2 infection and should not be used as the sole basis for treatment or other patient management decisions. A negative result may occur with  improper specimen collection/handling, submission of specimen other than nasopharyngeal swab, presence of viral mutation(s) within the areas targeted by this assay, and inadequate number of viral copies(<138 copies/mL). A negative result must be combined with clinical observations, patient history, and epidemiological information. The expected result is Negative.  Fact Sheet for Patients:  EntrepreneurPulse.com.au  Fact Sheet for Healthcare Providers:  IncredibleEmployment.be  This test is no t yet approved or cleared by the [place] FDA  and  has been authorized for detection and/or diagnosis of SARS-CoV-2 by FDA under an Emergency Use Authorization (EUA). This EUA will remain  in effect (meaning this test can be used) for the duration of the COVID-19 declaration under Section 564(b)(1) of the Act, 21 U.S.C.section 360bbb-3(b)(1), unless the authorization is terminated  or revoked sooner.       Influenza A by PCR NEGATIVE NEGATIVE Final   Influenza B by PCR NEGATIVE NEGATIVE Final    Comment: (NOTE) The Xpert Xpress SARS-CoV-2/FLU/RSV plus assay is intended as an aid in the diagnosis of influenza  from Nasopharyngeal swab specimens and should not be used as a sole basis for treatment. Nasal washings and aspirates are unacceptable for Xpert Xpress SARS-CoV-2/FLU/RSV testing.  Fact Sheet for Patients: EntrepreneurPulse.com.au  Fact Sheet for Healthcare Providers: IncredibleEmployment.be  This test is not yet approved or cleared by the [place] FDA and has been authorized for detection and/or diagnosis of SARS-CoV-2 by FDA under an Emergency Use Authorization (EUA). This EUA will remain in effect (meaning this test can be used) for the duration of the COVID-19 declaration under Section 564(b)(1) of the Act, 21 U.S.C. section 360bbb-3(b)(1), unless the authorization is terminated or revoked.  Performed at [facility], [address], [place], [place] [address]      [facility] [phone]  07/19/2021  3:06 PM

## 2021-07-19 NOTE — H&P (Signed)
History and Physical    Patient: Stephen Lee MRN: [id number] DOA: 07/18/2021  Date of Service: the patient was seen and examined on 07/19/2021  Patient coming from: Home via EMS  Chief Complaint:  Chief Complaint  Patient presents with   SOB / Chest Tightness    HPI:   31 year old male with past medical history of depression presenting to [facility] emergency department with complaints of chest tightness and shortness of breath.  Patient explains that approximately 2 months ago he developed a 1 week episode of shortness of breath with intermittent cough.  Patient was evaluated in the [facility] emergency department on 4/9 and was diagnosed with bilateral atypical pneumonia.  Patient at that time was discharged home on a course of Augmentin and azithromycin.  Since then the patient states that his symptoms did transiently improve.  Approximately 5 days ago however patient states that he has developed a recurrent dry cough.  This has been associated with shortness of breath.  Shortness of breath was initially mild in intensity but rapidly has become more more severe.  Shortness of breath is worse with exertion and improved with rest.  Patient is also complaining of associated chest discomfort.  Chest discomfort is midsternal in location, sharp in quality and worse with deep inspiration or movement.  Upon further questioning patient denies sick contacts, recent travel, intravenous drug use, fevers, paroxysmal nocturnal dyspnea, peripheral edema or contact with known COVID-19 infection.  Due to progressively worsening symptoms patient eventually contacted EMS who promptly came to evaluate the patient and brought him into [facility] emergency department for evaluation.  Upon evaluation in the emergency department patient was found to have bilateral lower lobe groundglass infiltrates on CT imaging concerning for pneumonia.  Patient had no evidence of pulmonary  embolism.  ER provider initiated intravenous ceftriaxone and azithromycin for treatment.  Patient was also found to be hypoxic with oxygen saturations in the 80s and therefore was placed on 3 L of oxygen via nasal cannula.  The hospitalist group was then called to assess the patient for admission to the hospital.  Review of Systems: Review of Systems  Respiratory:  Positive for cough and shortness of breath.   Neurological:  Positive for weakness.  All other systems reviewed and are negative.   Past Medical History:  Diagnosis Date   Attention deficit    Condyloma acuminatum in male    perianal & anal canal   Depression     History reviewed. No pertinent surgical history.  Social History:  reports that he has never smoked. He has never used smokeless tobacco. He reports current alcohol use. He reports that he does not use drugs.  No Known Allergies  History reviewed. No pertinent family history.  Prior to Admission medications   Medication Sig Start Date End Date Taking? Authorizing Provider  benzonatate (TESSALON) 100 MG capsule Take 1 capsule (100 mg total) by mouth every 8 (eight) hours. Patient not taking: Reported on 07/19/2021 03/23/18   Joy, Ines Bloomer C, PA-C  fluticasone (FLONASE) 50 MCG/ACT nasal spray Place 2 sprays into both nostrils daily. Patient not taking: Reported on 07/19/2021 03/23/18   Joy, Shawn C, PA-C  ondansetron (ZOFRAN ODT) 4 MG disintegrating tablet Take 1 tablet (4 mg total) by mouth every 8 (eight) hours as needed for nausea or vomiting. Patient not taking: Reported on 07/19/2021 03/04/19   Glynn Octave, MD  oseltamivir (TAMIFLU) 75 MG capsule Take 1 capsule (75 mg total) by mouth every  12 (twelve) hours. Patient not taking: Reported on 07/19/2021 03/23/18   Anselm PancoastJoy, Shawn C, PA-C    Physical Exam:  Vitals:   07/19/21 0645 07/19/21 0700 07/19/21 0815 07/19/21 0845  BP: (!) 150/107 (!) 152/100 (!) 146/106   Pulse: (!) 101 100 (!) 114   Resp: 15 19 (!) 27   Temp:     99 F (37.2 C)  TempSrc:    Oral  SpO2: 99% 99% 96% 98%    Constitutional: Awake alert and oriented x3, patient is in respiratory distress Skin: no rashes, no lesions, good skin turgor noted. Eyes: Pupils are equally reactive to light.  No evidence of scleral icterus or conjunctival pallor.  ENMT: Moist mucous membranes noted.  Posterior pharynx clear of any exudate or lesions.   Neck: normal, supple, no masses, no thyromegaly.  No evidence of jugular venous distension.   Respiratory: Notable fine rales in the lower and mid fields bilaterally.  No evidence of associated wheezing.  Evidence of increased respiratory effort with mild accessory muscle use.   Cardiovascular: Tachycardic rate with regular rhythm, no murmurs / rubs / gallops. No extremity edema. 2+ pedal pulses. No carotid bruits.  Chest:   Nontender without crepitus or deformity.   Back:   Nontender without crepitus or deformity. Abdomen: Abdomen is soft and nontender.  No evidence of intra-abdominal masses.  Positive bowel sounds noted in all quadrants.   Musculoskeletal: No joint deformity upper and lower extremities. Good ROM, no contractures. Normal muscle tone.  Neurologic: CN 2-12 grossly intact. Sensation intact.  Patient moving all 4 extremities spontaneously.  Patient is following all commands.  Patient is responsive to verbal stimuli.   Psychiatric: Patient exhibits anxious mood with appropriate affect.  Patient seems to possess insight as to their current situation.    Data Reviewed:  I have personally reviewed and interpreted labs, imaging.  Significant findings are:  Lactic acid 1.6. CBC revealing white blood cell count of 9.4, hemoglobin 13.8, hematocrit 43, platelet count 261.   Chemistry revealing sodium 134, potassium 3.3, glucose 110.   2 sets of troponins have been obtained and are 7 and 10.   COVID-19 PCR testing negative. Chest x-ray personally reviewed reveals patchy bibasilar infiltrates  EKG:  Personally reviewed.  Rhythm is Sinus tachycardia with heart rate of 138BPM.  No dynamic ST segment changes appreciated.   Assessment and Plan: * Acute respiratory failure with hypoxia [facility](HCC) Patient presenting with rapidly progressive shortness of breath with associated hypoxia, now requiring 3 L of oxygen via nasal cannula.   Patient has been suffering from ongoing shortness of breath and cough for the past several weeks that had a brief reprieve after a course of Augmentin and azithromycin. Chest x-ray reveals patchy diffuse bilateral infiltrates with HIV testing being positive Lack of response to antibiotic therapy, hypoxia and HIV positivity are all suggestive that this is pneumocystis jiroveci pneumonia Initiating pharmacy consultation for Bactrim initiation Obtaining room air ABG and will additionally initiate prednisone therapy if PaO2 is less than 70 which I do expect I have additionally discussed the case with Dr. Drue SecondSnider with infectious disease who will come see in consultation.  She recommends obtaining an LDH, Legionella antigen, pneumococcus pneumoniae antigen and Fungitell Continue to provide supplemental oxygen as necessary to achieve oxygen saturations of between 92 to 96% Blood cultures have additionally been obtained Hydrating patient with intravenous isotonic fluids   Pneumocystis jiroveci pneumonia (HCC) Please see assessment and plan above  HIV (human immunodeficiency virus infection) (  HCC) Patient found to be HIV positive during this hospitalization, a new diagnosis Patient reports ongoing unprotected sex with his male partner for the past 3 years Last HIV testing in our system for this patient was 4 years ago and was negative at that time Obtaining CD4 and HIV viral load As mentioned above, infectious disease consult is being obtained, their assistance is appreciated.  Hypokalemia Replacing with potassium chloride Evaluating for concurrent hypomagnesemia   Monitoring potassium levels with serial chemistries.        Code Status:  Full code  code status decision has been confirmed with: Patient Family Communication: Mother is at bedside.  Patient has given clear instructions that neither she or anyone else that visits be told that the patient is HIV positive.  Consults: Dr. Drue Second with Infectious Disease  Severity of Illness:  The appropriate patient status for this patient is INPATIENT. Inpatient status is judged to be reasonable and necessary in order to provide the required intensity of service to ensure the patient's safety. The patient's presenting symptoms, physical exam findings, and initial radiographic and laboratory data in the context of their chronic comorbidities is felt to place them at high risk for further clinical deterioration. Furthermore, it is not anticipated that the patient will be medically stable for discharge from the hospital within 2 midnights of admission.   * I certify that at the point of admission it is my clinical judgment that the patient will require inpatient hospital care spanning beyond 2 midnights from the point of admission due to high intensity of service, high risk for further deterioration and high frequency of surveillance required.*  Author:  Marinda Elk MD  07/19/2021 10:47 AM

## 2021-07-20 DIAGNOSIS — J9601 Acute respiratory failure with hypoxia: Secondary | ICD-10-CM | POA: Diagnosis not present

## 2021-07-20 LAB — CBC WITH DIFFERENTIAL/PLATELET
Abs Immature Granulocytes: 0 10*3/uL (ref 0.00–0.07)
Basophils Absolute: 0 10*3/uL (ref 0.0–0.1)
Basophils Relative: 0 %
Eosinophils Absolute: 0 10*3/uL (ref 0.0–0.5)
Eosinophils Relative: 0 %
HCT: 39.2 % (ref 39.0–52.0)
Hemoglobin: 13 g/dL (ref 13.0–17.0)
Lymphocytes Relative: 7 %
Lymphs Abs: 0.3 10*3/uL — ABNORMAL LOW (ref 0.7–4.0)
MCH: 30.5 pg (ref 26.0–34.0)
MCHC: 33.2 g/dL (ref 30.0–36.0)
MCV: 92 fL (ref 80.0–100.0)
Monocytes Absolute: 0.5 10*3/uL (ref 0.1–1.0)
Monocytes Relative: 10 %
Myelocytes: 1 %
Neutro Abs: 3.9 10*3/uL (ref 1.7–7.7)
Neutrophils Relative %: 82 %
Platelets: 235 10*3/uL (ref 150–400)
RBC: 4.26 MIL/uL (ref 4.22–5.81)
RDW: 13.6 % (ref 11.5–15.5)
WBC: 4.8 10*3/uL (ref 4.0–10.5)
nRBC: 0 % (ref 0.0–0.2)
nRBC: 0 /100 WBC

## 2021-07-20 LAB — EXPECTORATED SPUTUM ASSESSMENT W GRAM STAIN, RFLX TO RESP C

## 2021-07-20 LAB — COMPREHENSIVE METABOLIC PANEL
ALT: 14 U/L (ref 0–44)
AST: 36 U/L (ref 15–41)
Albumin: 3.4 g/dL — ABNORMAL LOW (ref 3.5–5.0)
Alkaline Phosphatase: 91 U/L (ref 38–126)
Anion gap: 10 (ref 5–15)
BUN: <5 mg/dL — ABNORMAL LOW (ref 6–20)
CO2: 22 mmol/L (ref 22–32)
Calcium: 9 mg/dL (ref 8.9–10.3)
Chloride: 106 mmol/L (ref 98–111)
Creatinine, Ser: 0.92 mg/dL (ref 0.61–1.24)
GFR, Estimated: >60 mL/min (ref >60)
Glucose, Bld: 140 mg/dL — ABNORMAL HIGH (ref 70–99)
Potassium: 4.4 mmol/L (ref 3.5–5.1)
Sodium: 138 mmol/L (ref 135–145)
Total Bilirubin: 0.4 mg/dL (ref 0.3–1.2)
Total Protein: 7.3 g/dL (ref 6.5–8.1)

## 2021-07-20 LAB — HIV-1 RNA QUANT-NO REFLEX-BLD
HIV 1 RNA Quant: 580000 copies/mL
LOG10 HIV-1 RNA: 5.763 log10copy/mL

## 2021-07-20 LAB — T-HELPER CELLS (CD4) COUNT (NOT AT ARMC)
CD4 % Helper T Cell: 11 % — ABNORMAL LOW (ref 33–65)
CD4 T Cell Abs: 115 /uL — ABNORMAL LOW (ref 400–1790)

## 2021-07-20 LAB — STREP PNEUMONIAE URINARY ANTIGEN: Strep Pneumo Urinary Antigen: NEGATIVE

## 2021-07-20 LAB — MAGNESIUM: Magnesium: 2.1 mg/dL (ref 1.7–2.4)

## 2021-07-20 MED ORDER — PREDNISONE 20 MG PO TABS
40.0000 mg | ORAL_TABLET | Freq: Two times a day (BID) | ORAL | Status: AC
  Administered 2021-07-20 – 2021-07-23 (×7): 40 mg via ORAL
  Filled 2021-07-20 (×7): qty 2

## 2021-07-20 MED ORDER — ORAL CARE MOUTH RINSE
15.0000 mL | Freq: Two times a day (BID) | OROMUCOSAL | Status: DC
  Administered 2021-07-20 – 2021-07-24 (×7): 15 mL via OROMUCOSAL

## 2021-07-20 MED ORDER — BICTEGRAVIR-EMTRICITAB-TENOFOV 50-200-25 MG PO TABS
1.0000 | ORAL_TABLET | Freq: Every day | ORAL | Status: DC
Start: 2021-07-20 — End: 2021-07-24
  Administered 2021-07-20 – 2021-07-24 (×5): 1 via ORAL
  Filled 2021-07-20 (×5): qty 1

## 2021-07-20 MED ORDER — PREDNISONE 20 MG PO TABS
40.0000 mg | ORAL_TABLET | Freq: Every day | ORAL | Status: DC
  Administered 2021-07-24: 40 mg via ORAL
  Filled 2021-07-20: qty 2

## 2021-07-20 MED ORDER — PREDNISONE 20 MG PO TABS
20.0000 mg | ORAL_TABLET | Freq: Every day | ORAL | Status: DC
Start: 2021-07-29 — End: 2021-07-24

## 2021-07-20 MED ORDER — BENZONATATE 100 MG PO CAPS
200.0000 mg | ORAL_CAPSULE | Freq: Three times a day (TID) | ORAL | Status: DC | PRN
Start: 2021-07-20 — End: 2021-07-24

## 2021-07-20 MED ORDER — GUAIFENESIN-DM 100-10 MG/5ML PO SYRP
15.0000 mL | ORAL_SOLUTION | ORAL | Status: DC | PRN
Start: 2021-07-20 — End: 2021-07-24
  Administered 2021-07-20 – 2021-07-22 (×4): 15 mL via ORAL
  Filled 2021-07-20 (×4): qty 15

## 2021-07-20 NOTE — Progress Notes (Signed)
PROGRESS NOTE  Stephen Lee  DOB: 1990-11-09  PCP: System, Provider Not In VOJ:[id number]  DOA: 07/18/2021  LOS: 1 day  Hospital Day: 3  Brief narrative: Stephen Lee is a 31 y.o. homosexual male who presented to the ED on 6/6 with complaint of chest tightness, shortness of breath. 4/9, patient was seen at ED for shortness of breath, cough for a week.  He was diagnosed with bilateral atypical pneumonia and was discharged home on Augmentin and azithromycin.  His symptoms improved at that time. About 5 days ago, patient started having dry cough, shortness of breath and chest discomfort with exertion which progressed and hence patient presented to the ED.  In the ED, he had a fever of 103, heart rate elevated to 144, blood pressure 156/107, O2 sat 82% on room air, and required 3 L oxygen by nasal cannula. CBC unremarkable, BMP with low sodium at 134, low potassium 3.3, LFTs normal, lactic acid level normal Blood gas with pH 7.43, PCO2 37, PO2 low at 48 HIV screening test came reactive. LDH elevated to 3 1, CRP elevated 8. CT angio chest rule out pulm embolism but showed bilateral lower lobe groundglass infiltrates. Patient was started on broad-spectrum antibiotics Hospitalist service was consulted for inpatient admission and management.  Subjective: Patient was seen and examined this morning.  Pleasant.  Propped up in bed.  Tachycardia improving.  On 5 Truxima nasal cannula.  Patient has not discussed his diagnosis with his partner or his mom. No shortness of breath at rest, on oxygen.  Principal Problem:   Acute respiratory failure with hypoxia (HCC) Active Problems:   Pneumocystis jiroveci pneumonia (HCC)   Hypokalemia   HIV (human immunodeficiency virus infection) (HCC)    Assessment and plan: Bilateral pneumonia New diagnosis of HIV and homosexual male -Presented with worsening shortness of breath, chest tightness  -CT chest as above. -HIV test reactive.  Confirmatory test  sent. -High suspicion of PCP pneumonia -Admitting physician started on Bactrim and steroids -Also to continue CAP coverage with ceftriaxone and azithromycin -Per ID recommendation, sent for CD4, viral load, LDH, Legionella antigen, pneumococcus pneumoniae antigen and Fungitell  Acute respiratory failure with hypoxia -Blood gas with PaO2 less than 70. -Started on prednisone. -Currently on 5 L oxygen by nasal cannula.  Respiratory rate in low 20s..  Patient is aware of the possibility of intubation if he worsens.   Hypokalemia -Level improved with replacement. Recent Labs  Lab 07/18/21 2335 07/19/21 1102 07/20/21 0104  K 3.3* 3.8 4.4  MG  --   --  2.1   Goals of care   Code Status: Full Code    Mobility: Encourage ambulation when oxygenation improves  Skin assessment:     Nutritional status:  Body mass index is 23.12 kg/m.          Diet:  Diet Order             Diet regular Room service appropriate? Yes; Fluid consistency: Thin  Diet effective now                   DVT prophylaxis:  enoxaparin (LOVENOX) injection 40 mg Start: 07/19/21 1000   Antimicrobials: Per ID Fluid: NS with potassium at 75 mill per hour Consultants: ID Family Communication: Mom at bedside  Status is: Inpatient  Continue in-hospital care because: New HIV, possible PCP pneumonia Level of care: Telemetry Medical   Dispo: The patient is from: Home  Anticipated d/c is to: Home in 2 to 3 days              Patient currently is not medically stable to d/c.   Difficult to place patient No     Infusions:   0.9 % NaCl with KCl 20 mEq / L 75 mL/hr at 07/20/21 1036   azithromycin 500 mg (07/20/21 0657)   cefTRIAXone (ROCEPHIN)  IV Stopped (07/20/21 9169)   sulfamethoxazole-trimethoprim 320 mg of trimethoprim (07/20/21 1244)    Scheduled Meds:  bictegravir-emtricitabine-tenofovir AF  1 tablet Oral Daily   enoxaparin (LOVENOX) injection  40 mg Subcutaneous Q24H    predniSONE  40 mg Oral BID    PRN meds: acetaminophen **OR** acetaminophen, ALPRAZolam, benzonatate, guaiFENesin-dextromethorphan, ondansetron **OR** ondansetron (ZOFRAN) IV, polyethylene glycol   Antimicrobials: Anti-infectives (From admission, onward)    Start     Dose/Rate Route Frequency Ordered Stop   07/20/21 1345  bictegravir-emtricitabine-tenofovir AF (BIKTARVY) 50-200-25 MG per tablet 1 tablet        1 tablet Oral Daily 07/20/21 1252     07/20/21 0600  cefTRIAXone (ROCEPHIN) 2 g in sodium chloride 0.9 % 100 mL IVPB        2 g 200 mL/hr over 30 Minutes Intravenous Every 24 hours 07/19/21 0601 07/25/21 0559   07/20/21 0600  azithromycin (ZITHROMAX) 500 mg in sodium chloride 0.9 % 250 mL IVPB        500 mg 250 mL/hr over 60 Minutes Intravenous Every 24 hours 07/19/21 0601 07/24/21 0559   07/19/21 1200  sulfamethoxazole-trimethoprim (BACTRIM) 320 mg of trimethoprim in dextrose 5 % 500 mL IVPB        320 mg of trimethoprim 346.7 mL/hr over 90 Minutes Intravenous Every 6 hours 07/19/21 1124     07/19/21 0045  cefTRIAXone (ROCEPHIN) 1 g in sodium chloride 0.9 % 100 mL IVPB        1 g 200 mL/hr over 30 Minutes Intravenous  Once 07/19/21 0031 07/19/21 0202   07/19/21 0045  azithromycin (ZITHROMAX) 500 mg in sodium chloride 0.9 % 250 mL IVPB        500 mg 250 mL/hr over 60 Minutes Intravenous  Once 07/19/21 0031 07/19/21 0322       Objective: Vitals:   07/20/21 0337 07/20/21 0806  BP: (!) 128/92 123/77  Pulse: 95 97  Resp: (!) 25 (!) 22  Temp: 98.4 F (36.9 C) 98.4 F (36.9 C)  SpO2: 97% 98%    Intake/Output Summary (Last 24 hours) at 07/20/2021 1305 Last data filed at 07/20/2021 0942 Gross per 24 hour  Intake 4441.11 ml  Output 1800 ml  Net 2641.11 ml   Filed Weights   07/19/21 1100 07/19/21 2117  Weight: 77.1 kg 75.2 kg   Weight change:  Body mass index is 23.12 kg/m.   Physical Exam: General exam: Pleasant, young African-American male.  Mild respiratory  distress.  On supplemental oxygen Skin: No rashes, lesions or ulcers. HEENT: Atraumatic, normocephalic, no obvious bleeding Lungs: Clear to auscultation bilaterally CVS: Regular rate and rhythm, no murmur GI/Abd soft, nontender, nondistended, bowel sound present CNS: Alert, awake, oriented x3 Psychiatry: Sad affect Extremities: No pedal edema, no calf tenderness  Data Review: I have personally reviewed the laboratory data and studies available.  F/u labs ordered Unresulted Labs (From admission, onward)     Start     Ordered   07/21/21 0500  RPR  Tomorrow morning,   R        07/20/21 1114  07/19/21 1042  Fungitell, Serum  Once,   R        07/19/21 1041   07/19/21 1019  Pneumocystis smear by DFA  Once,   R        07/19/21 1019   07/19/21 1015  Blood gas, arterial  Once,   R       Question:  Room air or oxygen  Answer:  Room air   07/19/21 1014   07/19/21 0600  Legionella Pneumophila Serogp 1 Ur Ag  (COPD / Pneumonia / Cellulitis / Lower Extremity Wound)  Once,   R        07/19/21 0601   07/19/21 0559  Expectorated Sputum Assessment w Gram Stain, Rflx to Resp Cult  (COPD / Pneumonia / Cellulitis / Lower Extremity Wound)  Once,   R        07/19/21 0601   07/19/21 0307  HIV-1/2 AB - differentiation  Once,   R        07/19/21 0307   07/19/21 0029  Blood Culture (routine x 2)  (Undifferentiated presentation (screening labs and basic nursing orders))  BLOOD CULTURE X 2,   STAT      07/19/21 0029            Signed, Lorin GlassBinaya Valari Taylor, MD [facility] 07/20/2021

## 2021-07-20 NOTE — Progress Notes (Signed)
[facility]  Date of Admission:  07/18/2021     Total days of antibiotics 3         ASSESSMENT:  Mr. Nichter's respiratory panel and urine Strep Pneumoniae are negative. Can discontinue droplet precautions. Elevated LDH. Confirmatory HIV RNA level of 585,000 with CD4 count of 115. Discussed the plan of care and the basic information regarding HIV including transmission, risk if left untreated and treatment options. Will start Biktarvy.Fungitell and legionella blood work is pending. Currently on 5L nasal cannula with SP02 remains in the 90s. Renal function remains stable. Continue current dose of Bactrim and Prednisone. Has primary insurance and will arrange for copay card as needed and medication for discharge. Await remaining test results. Check RPR and urine cytology with history of gonorrhea. Remaining medical and supportive care per primary team.     PLAN:  Start Biktarvy Continue Bactrim and prednisone for suspected PCP.  Monitor remaining lab work and check RPR.  Therapeutic drug monitoring renal function Remaining medical and supportive care per primary team.   Principal Problem:   Acute respiratory failure with hypoxia (HCC) Active Problems:   Hypokalemia   Pneumocystis jiroveci pneumonia (HCC)   HIV (human immunodeficiency virus infection) (HCC)    enoxaparin (LOVENOX) injection  40 mg Subcutaneous Q24H   predniSONE  40 mg Oral BID    SUBJECTIVE:  Afebrile overnight with no acute events. Breathing a little better today and the bed is more comfortable than in the ED.   No Known Allergies   Review of Systems: Review of Systems  Constitutional:  Negative for chills, fever and weight loss.  Respiratory:  Negative for cough, shortness of breath and wheezing.   Cardiovascular:  Negative for chest pain and leg swelling.  Gastrointestinal:  Negative for abdominal pain, constipation, diarrhea, nausea and vomiting.  Skin:  Negative for rash.     OBJECTIVE: Vitals:   07/19/21 2300 07/20/21 0115 07/20/21 0337 07/20/21 0806  BP: (!) 141/95 125/90 (!) 128/92 123/77  Pulse: (!) 107 97 95 97  Resp: (!) 22 (!) 23 (!) 25 (!) 22  Temp: 99.2 F (37.3 C) 98.3 F (36.8 C) 98.4 F (36.9 C) 98.4 F (36.9 C)  TempSrc: Oral Oral Oral Oral  SpO2: 96% 95% 97% 98%  Weight:      Height:       Body mass index is 23.12 kg/m.  Physical Exam Constitutional:      General: He is not in acute distress.    Appearance: He is well-developed.  Cardiovascular:     Rate and Rhythm: Normal rate and regular rhythm.     Heart sounds: Normal heart sounds.  Pulmonary:     Effort: Pulmonary effort is normal.     Breath sounds: Normal breath sounds.  Skin:    General: Skin is warm and dry.  Neurological:     Mental Status: He is alert and oriented to person, place, and time.  Psychiatric:        Behavior: Behavior normal.        Thought Content: Thought content normal.        Judgment: Judgment normal.     Lab Results Lab Results  Component Value Date   WBC 4.8 07/20/2021   HGB 13.0 07/20/2021   HCT 39.2 07/20/2021   MCV 92.0 07/20/2021   PLT 235 07/20/2021    Lab Results  Component Value Date   CREATININE 0.92 07/20/2021   BUN <5 (L) 07/20/2021  NA 138 07/20/2021   K 4.4 07/20/2021   CL 106 07/20/2021   CO2 22 07/20/2021    Lab Results  Component Value Date   ALT 14 07/20/2021   AST 36 07/20/2021   ALKPHOS 91 07/20/2021   BILITOT 0.4 07/20/2021     Microbiology: Recent Results (from the past 240 hour(s))  Resp Panel by RT-PCR (Flu A&B, Covid) Anterior Nasal Swab     Status: None   Collection Time: 07/19/21  3:15 AM   Specimen: Anterior Nasal Swab  Result Value Ref Range Status   SARS Coronavirus 2 by RT PCR NEGATIVE NEGATIVE Final    Comment: (NOTE) SARS-CoV-2 target nucleic acids are NOT DETECTED.  The SARS-CoV-2 RNA is generally detectable in upper respiratory specimens during the acute phase of infection. The  lowest concentration of SARS-CoV-2 viral copies this assay can detect is 138 copies/mL. A negative result does not preclude SARS-Cov-2 infection and should not be used as the sole basis for treatment or other patient management decisions. A negative result may occur with  improper specimen collection/handling, submission of specimen other than nasopharyngeal swab, presence of viral mutation(s) within the areas targeted by this assay, and inadequate number of viral copies(<138 copies/mL). A negative result must be combined with clinical observations, patient history, and epidemiological information. The expected result is Negative.  Fact Sheet for Patients:  BloggerCourse.com  Fact Sheet for Healthcare Providers:  SeriousBroker.it  This test is no t yet approved or cleared by the [place] FDA and  has been authorized for detection and/or diagnosis of SARS-CoV-2 by FDA under an Emergency Use Authorization (EUA). This EUA will remain  in effect (meaning this test can be used) for the duration of the COVID-19 declaration under Section 564(b)(1) of the Act, 21 U.S.C.section 360bbb-3(b)(1), unless the authorization is terminated  or revoked sooner.       Influenza A by PCR NEGATIVE NEGATIVE Final   Influenza B by PCR NEGATIVE NEGATIVE Final    Comment: (NOTE) The Xpert Xpress SARS-CoV-2/FLU/RSV plus assay is intended as an aid in the diagnosis of influenza from Nasopharyngeal swab specimens and should not be used as a sole basis for treatment. Nasal washings and aspirates are unacceptable for Xpert Xpress SARS-CoV-2/FLU/RSV testing.  Fact Sheet for Patients: BloggerCourse.com  Fact Sheet for Healthcare Providers: SeriousBroker.it  This test is not yet approved or cleared by the [place] FDA and has been authorized for detection and/or diagnosis of SARS-CoV-2 by FDA under  an Emergency Use Authorization (EUA). This EUA will remain in effect (meaning this test can be used) for the duration of the COVID-19 declaration under Section 564(b)(1) of the Act, 21 U.S.C. section 360bbb-3(b)(1), unless the authorization is terminated or revoked.  Performed at [facility], [address] [place], [place] [address]   Respiratory (~20 pathogens) panel by PCR     Status: None   Collection Time: 07/19/21  9:46 PM   Specimen: Nasopharyngeal Swab; Respiratory  Result Value Ref Range Status   Adenovirus NOT DETECTED NOT DETECTED Final   Coronavirus 229E NOT DETECTED NOT DETECTED Final    Comment: (NOTE) The Coronavirus on the Respiratory Panel, DOES NOT test for the novel  Coronavirus (2019 nCoV)    Coronavirus HKU1 NOT DETECTED NOT DETECTED Final   Coronavirus NL63 NOT DETECTED NOT DETECTED Final   Coronavirus OC43 NOT DETECTED NOT DETECTED Final   Metapneumovirus NOT DETECTED NOT DETECTED Final   Rhinovirus / Enterovirus NOT DETECTED NOT DETECTED Final  Influenza A NOT DETECTED NOT DETECTED Final   Influenza B NOT DETECTED NOT DETECTED Final   Parainfluenza Virus 1 NOT DETECTED NOT DETECTED Final   Parainfluenza Virus 2 NOT DETECTED NOT DETECTED Final   Parainfluenza Virus 3 NOT DETECTED NOT DETECTED Final   Parainfluenza Virus 4 NOT DETECTED NOT DETECTED Final   Respiratory Syncytial Virus NOT DETECTED NOT DETECTED Final   Bordetella pertussis NOT DETECTED NOT DETECTED Final   Bordetella Parapertussis NOT DETECTED NOT DETECTED Final   Chlamydophila pneumoniae NOT DETECTED NOT DETECTED Final   Mycoplasma pneumoniae NOT DETECTED NOT DETECTED Final    Comment: Performed at [facility], [address], [place], [place] [address]     Marcos Eke, NP [facility]  07/20/2021  10:51 AM

## 2021-07-21 ENCOUNTER — Other Ambulatory Visit (HOSPITAL_COMMUNITY): Payer: Self-pay

## 2021-07-21 DIAGNOSIS — B20 Human immunodeficiency virus [HIV] disease: Secondary | ICD-10-CM | POA: Diagnosis not present

## 2021-07-21 DIAGNOSIS — B59 Pneumocystosis: Secondary | ICD-10-CM | POA: Diagnosis not present

## 2021-07-21 DIAGNOSIS — J9601 Acute respiratory failure with hypoxia: Secondary | ICD-10-CM | POA: Diagnosis not present

## 2021-07-21 LAB — HEPATITIS C ANTIBODY: HCV Ab: NONREACTIVE

## 2021-07-21 LAB — HIV-1/2 AB - DIFFERENTIATION
HIV 1 Ab: REACTIVE
HIV 2 Ab: NONREACTIVE

## 2021-07-21 LAB — LIPID PANEL
Cholesterol: 192 mg/dL (ref 0–200)
HDL: 38 mg/dL — ABNORMAL LOW (ref >40)
LDL Cholesterol: 138 mg/dL — ABNORMAL HIGH (ref 0–99)
Total CHOL/HDL Ratio: 5.1 RATIO
Triglycerides: 78 mg/dL (ref <150)
VLDL: 16 mg/dL (ref 0–40)

## 2021-07-21 LAB — LEGIONELLA PNEUMOPHILA SEROGP 1 UR AG: L. pneumophila Serogp 1 Ur Ag: NEGATIVE

## 2021-07-21 LAB — RPR
RPR Ser Ql: REACTIVE — AB
RPR Titer: 1:8 {titer}

## 2021-07-21 LAB — HEPATITIS A ANTIBODY, TOTAL: hep A Total Ab: NONREACTIVE

## 2021-07-21 LAB — HEPATITIS B SURFACE ANTIGEN: Hepatitis B Surface Ag: NONREACTIVE

## 2021-07-21 MED ORDER — BICTEGRAVIR-EMTRICITAB-TENOFOV 50-200-25 MG PO TABS
1.0000 | ORAL_TABLET | Freq: Every day | ORAL | 0 refills | Status: DC
  Filled 2021-07-21: qty 30, 30d supply, fill #0

## 2021-07-21 MED ORDER — PENICILLIN G BENZATHINE 1200000 UNIT/2ML IM SUSY
2.4000 10*6.[IU] | PREFILLED_SYRINGE | INTRAMUSCULAR | Status: DC
  Administered 2021-07-21: 2.4 10*6.[IU] via INTRAMUSCULAR
  Filled 2021-07-21: qty 4

## 2021-07-21 NOTE — Progress Notes (Signed)
[facility]    Date of Admission:  07/18/2021   Total days of antibiotics 4         ID: Stephen Lee is a 31 y.o. male with newly diagnosed hiv disease presented with probable pjp  Principal Problem:   Acute respiratory failure with hypoxia (HCC) Active Problems:   Hypokalemia   Pneumocystis jiroveci pneumonia (HCC)   HIV (human immunodeficiency virus infection) (HCC)    Subjective: Afebrile. Did ambulate down hall with some desaturation, still needing 4L [place]  Labs showed RPR 1:8 - not previously treated recently   Medications:   bictegravir-emtricitabine-tenofovir AF  1 tablet Oral Daily   enoxaparin (LOVENOX) injection  40 mg Subcutaneous Q24H   mouth rinse  15 mL Mouth Rinse BID   penicillin g benzathine (BICILLIN-LA) IM  2.4 Million Units Intramuscular Weekly   predniSONE  40 mg Oral BID WC   Followed by   Melene Muller ON 07/24/2021] predniSONE  40 mg Oral Q breakfast   Followed by   Melene Muller ON 07/29/2021] predniSONE  20 mg Oral Q breakfast    Objective: Vital signs in last 24 hours: Temp:  [97.4 F (36.3 C)-98.6 F (37 C)] 98.3 F (36.8 C) (06/09 1546) Pulse Rate:  [80-100] 97 (06/09 1546) Resp:  [20-31] 24 (06/09 1546) BP: (129-139)/(81-97) 136/97 (06/09 1546) SpO2:  [92 %-96 %] 96 % (06/09 1219)  Physical Exam  Constitutional: He is oriented to person, place, and time. He appears well-developed and well-nourished. No distress.  HENT:  Mouth/Throat: Oropharynx is clear and moist. No oropharyngeal exudate.  Cardiovascular: Normal rate, regular rhythm and normal heart sounds. Exam reveals no gallop and no friction rub.  No murmur heard.  Pulmonary/Chest: Effort normal and breath sounds normal. No respiratory distress. He has no wheezes.  Abdominal: Soft. Bowel sounds are normal. He exhibits no distension. There is no tenderness.  Lymphadenopathy:  He has no cervical adenopathy.  Neurological: He is alert and oriented to person, place, and  time.  Skin: Skin is warm and dry. No rash noted. No erythema.  Psychiatric: He has a normal mood and affect. His behavior is normal.    Lab Results Recent Labs    07/18/21 2335 07/19/21 1102 07/20/21 0104  WBC 9.4  --  4.8  HGB 13.8 12.9* 13.0  HCT 43.0 38.0* 39.2  NA 134* 143 138  K 3.3* 3.8 4.4  CL 100  --  106  CO2 22  --  22  BUN 6  --  <5*  CREATININE 1.11  --  0.92   Liver Panel Recent Labs    07/19/21 0036 07/20/21 0104  PROT 7.9 7.3  ALBUMIN 3.8 3.4*  AST 34 36  ALT 16 14  ALKPHOS 93 91  BILITOT 0.7 0.4  BILIDIR 0.2  --   IBILI 0.5  --    Sedimentation Rate No results for input(s): "ESRSEDRATE" in the last 72 hours. C-Reactive Protein Recent Labs    07/19/21 0654  CRP 8.0*    Microbiology: RPR 1:8 Studies/Results: No results found.   Assessment/Plan: Pneumocystitis pneumonia = continue with iv bactrim today, if continues to improve - can switch to orals to finish out 21 day course, then he will stay on bactrim ds daily for proph. 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 Will give first dose today, then arrange follow doses at the ID clinic      [facility] Pager: [phone]  07/21/2021, 4:49 PM

## 2021-07-21 NOTE — Progress Notes (Signed)
PROGRESS NOTE  Jabier Thorup  DOB: [birth year]  PCP: System, Provider Not In YPP:[id number]  DOA: 07/18/2021  LOS: 2 days  Hospital Day: 4  Brief narrative: Stephen Lee is a 31 y.o. homosexual male who presented to the ED on 6/6 with complaint of chest tightness, shortness of breath. 4/9, patient was seen at ED for shortness of breath, cough for a week.  He was diagnosed with bilateral atypical pneumonia and was discharged home on Augmentin and azithromycin.  His symptoms improved at that time. About 5 days ago, patient started having dry cough, shortness of breath and chest discomfort with exertion which progressed and hence patient presented to the ED.  In the ED, he had a fever of 103, heart rate elevated to 144, blood pressure 156/107, O2 sat 82% on room air, and required 3 L oxygen by nasal cannula. CBC unremarkable, BMP with low sodium at 134, low potassium 3.3, LFTs normal, lactic acid level normal Blood gas with pH 7.43, PCO2 37, PO2 low at 48 HIV screening test came reactive. LDH elevated to 3 1, CRP elevated 8. CT angio chest rule out pulm embolism but showed bilateral lower lobe groundglass infiltrates. Patient was started on broad-spectrum antibiotics Hospitalist service was consulted for inpatient admission and management.  Subjective: Patient was seen and examined this afternoon along with ID Dr. Baxter Flattery..  Lying down in bed.  States that he walked earlier on the hallway.  He desatted and required high flow oxygen.  Gradually feeling better.  Principal Problem:   Acute respiratory failure with hypoxia (HCC) Active Problems:   Pneumocystis jiroveci pneumonia (HCC)   Hypokalemia   HIV (human immunodeficiency virus infection) ([place])    Assessment and plan: Bilateral pneumonia New diagnosis of HIV and homosexual male -Presented with worsening shortness of breath, chest tightness  -CT chest as above. -New diagnosis of HIV.  Biktarvy started by ID. -High suspicion of PCP  pneumonia -currently on IV Bactrim and prednisone 40 mg twice daily. -Per ID recommendation, sent for CD4, viral load, LDH, Legionella antigen, pneumococcus pneumoniae antigen and Fungitell  Acute respiratory failure with hypoxia -On admission blood gas showed a low PaO2 of 70. -Patient was started on prednisone. -Currently on 2 L oxygen by nasal cannula.  Gradually improving.  Required up to 6 L oxygen medicalized today on ambulation.  Home oxygen ordered.   Hypokalemia -Level improved with replacement. Recent Labs  Lab 07/18/21 2335 07/19/21 1102 07/20/21 0104  K 3.3* 3.8 4.4  MG  --   --  2.1    Goals of care   Code Status: Full Code    Mobility: Encourage ambulation when oxygenation improves  Skin assessment:     Nutritional status:  Body mass index is 23.12 kg/m.          Diet:  Diet Order             Diet regular Room service appropriate? Yes; Fluid consistency: Thin  Diet effective now                   DVT prophylaxis:  enoxaparin (LOVENOX) injection 40 mg Start: 07/19/21 1000   Antimicrobials: Per ID Fluid: Stop IV fluid Consultants: ID Family Communication: Family not at bedside today  Status is: Inpatient  Continue in-hospital care because: New HIV, possible PCP pneumonia, significant oxygen requirement. Level of care: Telemetry Medical   Dispo: The patient is from: Home              Anticipated  d/c is to: Home in 2 to 3 days on home oxygen              Patient currently is not medically stable to d/c.   Difficult to place patient No     Infusions:   0.9 % NaCl with KCl 20 mEq / L 75 mL/hr at 07/21/21 1401   sulfamethoxazole-trimethoprim 320 mg of trimethoprim (07/21/21 1257)    Scheduled Meds:  bictegravir-emtricitabine-tenofovir AF  1 tablet Oral Daily   enoxaparin (LOVENOX) injection  40 mg Subcutaneous Q24H   mouth rinse  15 mL Mouth Rinse BID   penicillin g benzathine (BICILLIN-LA) IM  2.4 Million Units Intramuscular  Weekly   predniSONE  40 mg Oral BID WC   Followed by   Derrill Memo ON 07/24/2021] predniSONE  40 mg Oral Q breakfast   Followed by   Derrill Memo ON 07/29/2021] predniSONE  20 mg Oral Q breakfast    PRN meds: acetaminophen **OR** acetaminophen, ALPRAZolam, benzonatate, guaiFENesin-dextromethorphan, ondansetron **OR** ondansetron (ZOFRAN) IV, polyethylene glycol   Antimicrobials: Anti-infectives (From admission, onward)    Start     Dose/Rate Route Frequency Ordered Stop   07/22/21 0000  bictegravir-emtricitabine-tenofovir AF (BIKTARVY) 50-200-25 MG TABS tablet        1 tablet Oral Daily 07/21/21 1405     07/21/21 1600  penicillin g benzathine (BICILLIN LA) 1200000 UNIT/2ML injection 2.4 Million Units        2.4 Million Units Intramuscular Weekly 07/21/21 1359 08/11/21 1559   07/20/21 1345  bictegravir-emtricitabine-tenofovir AF (BIKTARVY) 50-200-25 MG per tablet 1 tablet        1 tablet Oral Daily 07/20/21 1252     07/20/21 0600  cefTRIAXone (ROCEPHIN) 2 g in sodium chloride 0.9 % 100 mL IVPB  Status:  Discontinued        2 g 200 mL/hr over 30 Minutes Intravenous Every 24 hours 07/19/21 0601 07/20/21 1541   07/20/21 0600  azithromycin (ZITHROMAX) 500 mg in sodium chloride 0.9 % 250 mL IVPB  Status:  Discontinued        500 mg 250 mL/hr over 60 Minutes Intravenous Every 24 hours 07/19/21 0601 07/20/21 1541   07/19/21 1200  sulfamethoxazole-trimethoprim (BACTRIM) 320 mg of trimethoprim in dextrose 5 % 500 mL IVPB        320 mg of trimethoprim 346.7 mL/hr over 90 Minutes Intravenous Every 6 hours 07/19/21 1124     07/19/21 0045  cefTRIAXone (ROCEPHIN) 1 g in sodium chloride 0.9 % 100 mL IVPB        1 g 200 mL/hr over 30 Minutes Intravenous  Once 07/19/21 0031 07/19/21 0202   07/19/21 0045  azithromycin (ZITHROMAX) 500 mg in sodium chloride 0.9 % 250 mL IVPB        500 mg 250 mL/hr over 60 Minutes Intravenous  Once 07/19/21 0031 07/19/21 0322       Objective: Vitals:   07/21/21 0812 07/21/21  1219  BP: 129/81 (!) 139/97  Pulse: 85 93  Resp: 20 20  Temp: 98 F (36.7 C) (!) 97.4 F (36.3 C)  SpO2: 96% 96%    Intake/Output Summary (Last 24 hours) at 07/21/2021 1429 Last data filed at 07/21/2021 1218 Gross per 24 hour  Intake 4046.48 ml  Output 2760 ml  Net 1286.48 ml    Filed Weights   07/19/21 1100 07/19/21 2117  Weight: 77.1 kg 75.2 kg   Weight change:  Body mass index is 23.12 kg/m.   Physical Exam: General exam: Pleasant, young  African-American male.  Mild respiratory distress.  On supplemental oxygen Skin: No rashes, lesions or ulcers. HEENT: Atraumatic, normocephalic, no obvious bleeding Lungs: Clear to auscultate bilaterally CVS: Regular rate and rhythm, no murmur GI/Abd soft, nontender, nondistended, bowel sound present CNS: Alert, awake, oriented x3 Psychiatry: Sad affect Extremities: No pedal edema, no calf tenderness  Data Review: I have personally reviewed the laboratory data and studies available.  F/u labs ordered Unresulted Labs (From admission, onward)     Start     Ordered   07/21/21 0945  Hepatitis B surface antibody,quantitative  Once,   R        07/21/21 0945   07/21/21 0945  RNA, PCR (Graph) rfx/Geno EDI  Once,   R        07/21/21 0945   07/21/21 0945  HLA B*5701  Once,   R        07/21/21 0945   07/21/21 0901  T.pallidum Ab, Total  Once,   AD        07/21/21 0901   07/21/21 0500  QuantiFERON-TB Gold Plus  Tomorrow morning,   R        07/20/21 1404   07/19/21 1042  Fungitell, Serum  Once,   R        07/19/21 1041   07/19/21 1019  Pneumocystis smear by DFA  Once,   R        07/19/21 1019   07/19/21 1015  Blood gas, arterial  Once,   R       Question:  Room air or oxygen  Answer:  Room air   07/19/21 1014            Signed, Terrilee Croak, MD [facility] 07/21/2021

## 2021-07-21 NOTE — TOC Benefit Eligibility Note (Signed)
Patient Product/process development scientist completed.    The patient is currently admitted and upon discharge could be taking Biktarvy 50-200-25 mg tablets.  The current 30 day co-pay is, $125.00.   The patient is insured through [employer]     Stephen Lee, CPhT Pharmacy Patient Advocate Specialist [facility] Patient Advocate Team Direct Number: [phone]  Fax: [phone]

## 2021-07-21 NOTE — TOC Benefit Eligibility Note (Signed)
Patient Advocate Encounter   Was successful in obtaining a [place] copay card for [employer].    RxBin: [id number] PCN: ACCESS Member ID: [id number] Group ID: [id number]     Lyndel Safe, [place] Patient Advocate Specialist [place] Patient Advocate Team Direct Number: [phone]  Fax: [phone]

## 2021-07-21 NOTE — Progress Notes (Signed)
  Transition of Care [facility]) Screening Note   Patient Details  Name: Stephen Lee Date of Birth: 07/21/1990   Transition of Care [facility]) CM/SW Contact:    Darrold Span, RN Phone Number: 07/21/2021, 4:15 PM    Transition of Care Department [facility]) has reviewed patient and note order has been placed for home 02- will need qualifying note documented by nursing within 48hr of discharge, CM has sent msg to bedside RN. We will follow up for home 02 arrangements and referral.

## 2021-07-21 NOTE — Progress Notes (Signed)
Mobility Specialist: Progress Note   07/21/21 1122  Mobility  Activity Ambulated with assistance in hallway  Level of Assistance Contact guard assist, steadying assist  Assistive Device None  Distance Ambulated (ft) 290 ft  Activity Response Tolerated fair  $Mobility charge 1 Mobility   Pre-Mobility on 4 L/min [place]: 105 HR, 97% SpO2 During Mobility:    On 4 L/min [place]: 87% SpO2    On 6 L/min [place]: 119 HR, 94-96% SpO2 Post-Mobility on 4 L/min [place]: 102 HR, 93% SpO2  Pt received in the bed and agreeable to ambulation. Stopped x2 for standing breaks secondary to SOB, no other c/o throughout. Pt sitting EOB after session with call bell and phone at his side. Encouraged IS use.  [facility] Stephen Lee Mobility Specialist Mobility Specialist [address] [place]: [phone]

## 2021-07-22 DIAGNOSIS — B59 Pneumocystosis: Secondary | ICD-10-CM | POA: Diagnosis not present

## 2021-07-22 DIAGNOSIS — J9601 Acute respiratory failure with hypoxia: Secondary | ICD-10-CM | POA: Diagnosis not present

## 2021-07-22 DIAGNOSIS — B20 Human immunodeficiency virus [HIV] disease: Secondary | ICD-10-CM | POA: Diagnosis not present

## 2021-07-22 LAB — FUNGITELL, SERUM: Fungitell Result: 370 pg/mL — ABNORMAL HIGH (ref <80)

## 2021-07-22 LAB — HEPATITIS B SURFACE ANTIBODY, QUANTITATIVE: Hep B S AB Quant (Post): <3.1 m[IU]/mL — ABNORMAL LOW (ref >9.9)

## 2021-07-22 NOTE — Progress Notes (Addendum)
PROGRESS NOTE    Stephen Lee  JGG:[id number] DOB: Mar 18, 1990 DOA: 07/18/2021 PCP: System, Provider Not In   Brief Narrative: Stephen Lee is a 31 y.o. male with no prior medical history. Patient presented secondary to chest tightness and shortness of breath. He was found to have evidence of pneumonia on chest x-ray. Initial treatment for CAP initiated, however this transitioned to empiric treatment for pneumocystis pneumonia based on CD4 count and risk factors. Patient with associated hypoxia requiring supplemental oxygen.   Assessment and Plan:  Pneumocystis pneumonia Patient initially treated empirically with Ceftriaxone and azithromycin for presumed community acquired pneumonia. CD4 count low. ID consulted with recommendations for treatment of presumed pneumocystis pneumonia. Patient started on Bactrim IV and steroid taper. -Continue Bactrim (21 day course) -Continue Prednisone (taper over 21 days: 80 mg x5, 40 mg x5, 20 mg x11)  Acute respiratory failure with hypoxia Secondary to above. Patient requiring up to 5 L/min of oxygen. -Wean to room air -Ambulatory pulse ox daily  HIV/AIDS CD4 count of 115 and HIV viral load of 580,000 copies/mL. Patient started on Biktarvy this admission.  -Continue Biktarvy -Outpatient ID follow-up  Latent syphilis ID consulted. Recommendations for 3 weekly doses of IM penicillin  Hypokalemia Treated with potassium supplementation. Resolved.   DVT prophylaxis: Lovenox Code Status:   Code Status: Full Code Family Communication: None at bedside Disposition Plan: Discharge home in 1-2 days pending ability to wean oxygen to room air   Consultants:  Infectious disease  Procedures:  None  Antimicrobials: Ceftriaxone Azithromycin Penicillin G Bactrim Biktarvy    Subjective: Patient reports no dyspnea or chest pain.  Objective: BP 122/81 (BP Location: Left Arm)   Pulse 90   Temp 98.5 F (36.9 C) (Oral)   Resp 20   Ht 5\' 11"   (1.803 m)   Wt 75.2 kg   SpO2 96%   BMI 23.12 kg/m   Examination:  General exam: Appears calm and comfortable Respiratory system: Clear to auscultation. Respiratory effort normal. Cardiovascular system: S1 & S2 heard, RRR. No murmurs, rubs, gallops or clicks. Gastrointestinal system: Abdomen is distended, soft and non-tender. Normal bowel sounds heard. Central nervous system: Alert and oriented. No focal neurological deficits. Musculoskeletal: No edema. No calf tenderness Skin: No cyanosis. No rashes Psychiatry: Judgement and insight appear normal. Mood & affect appropriate.    Data Reviewed: I have personally reviewed following labs and imaging studies  CBC Lab Results  Component Value Date   WBC 4.8 07/20/2021   RBC 4.26 07/20/2021   HGB 13.0 07/20/2021   HCT 39.2 07/20/2021   MCV 92.0 07/20/2021   MCH 30.5 07/20/2021   PLT 235 07/20/2021   MCHC 33.2 07/20/2021   RDW 13.6 07/20/2021   LYMPHSABS 0.3 (L) 07/20/2021   MONOABS 0.5 07/20/2021   EOSABS 0.0 07/20/2021   BASOSABS 0.0 07/20/2021     Last metabolic panel Lab Results  Component Value Date   NA 138 07/20/2021   K 4.4 07/20/2021   CL 106 07/20/2021   CO2 22 07/20/2021   BUN <5 (L) 07/20/2021   CREATININE 0.92 07/20/2021   GLUCOSE 140 (H) 07/20/2021   GFRNONAA >60 07/20/2021   GFRAA >60 03/04/2019   CALCIUM 9.0 07/20/2021   PROT 7.3 07/20/2021   ALBUMIN 3.4 (L) 07/20/2021   BILITOT 0.4 07/20/2021   ALKPHOS 91 07/20/2021   AST 36 07/20/2021   ALT 14 07/20/2021   ANIONGAP 10 07/20/2021    GFR: Estimated Creatinine Clearance: 122.6 mL/min (by C-G formula based  on SCr of 0.92 mg/dL).  Recent Results (from the past 240 hour(s))  Blood Culture (routine x 2)     Status: None (Preliminary result)   Collection Time: 07/19/21  1:05 AM   Specimen: BLOOD LEFT HAND  Result Value Ref Range Status   Specimen Description BLOOD LEFT HAND  Final   Special Requests   Final    BOTTLES DRAWN AEROBIC AND  ANAEROBIC Blood Culture results may not be optimal due to an inadequate volume of blood received in culture bottles   Culture   Final    NO GROWTH 3 DAYS Performed at [facility], [address] [place], [place] [address]    Report Status PENDING  Incomplete  Blood Culture (routine x 2)     Status: None (Preliminary result)   Collection Time: 07/19/21  1:10 AM   Specimen: BLOOD  Result Value Ref Range Status   Specimen Description BLOOD RIGHT ANTECUBITAL  Final   Special Requests   Final    BOTTLES DRAWN AEROBIC AND ANAEROBIC Blood Culture results may not be optimal due to an inadequate volume of blood received in culture bottles   Culture   Final    NO GROWTH 3 DAYS Performed at [facility], [address] [place], [place] [address]    Report Status PENDING  Incomplete  Resp Panel by RT-PCR (Flu A&B, Covid) Anterior Nasal Swab     Status: None   Collection Time: 07/19/21  3:15 AM   Specimen: Anterior Nasal Swab  Result Value Ref Range Status   SARS Coronavirus 2 by RT PCR NEGATIVE NEGATIVE Final    Comment: (NOTE) SARS-CoV-2 target nucleic acids are NOT DETECTED.  The SARS-CoV-2 RNA is generally detectable in upper respiratory specimens during the acute phase of infection. The lowest concentration of SARS-CoV-2 viral copies this assay can detect is 138 copies/mL. A negative result does not preclude SARS-Cov-2 infection and should not be used as the sole basis for treatment or other patient management decisions. A negative result may occur with  improper specimen collection/handling, submission of specimen other than nasopharyngeal swab, presence of viral mutation(s) within the areas targeted by this assay, and inadequate number of viral copies(<138 copies/mL). A negative result must be combined with clinical observations, patient history, and epidemiological information. The expected result is Negative.  Fact Sheet for Patients:   BloggerCourse.comhttps://www.fda.gov/media/152166/download  Fact Sheet for Healthcare Providers:  SeriousBroker.ithttps://www.fda.gov/media/152162/download  This test is no t yet approved or cleared by the [place] FDA and  has been authorized for detection and/or diagnosis of SARS-CoV-2 by FDA under an Emergency Use Authorization (EUA). This EUA will remain  in effect (meaning this test can be used) for the duration of the COVID-19 declaration under Section 564(b)(1) of the Act, 21 U.S.C.section 360bbb-3(b)(1), unless the authorization is terminated  or revoked sooner.       Influenza A by PCR NEGATIVE NEGATIVE Final   Influenza B by PCR NEGATIVE NEGATIVE Final    Comment: (NOTE) The Xpert Xpress SARS-CoV-2/FLU/RSV plus assay is intended as an aid in the diagnosis of influenza from Nasopharyngeal swab specimens and should not be used as a sole basis for treatment. Nasal washings and aspirates are unacceptable for Xpert Xpress SARS-CoV-2/FLU/RSV testing.  Fact Sheet for Patients: BloggerCourse.comhttps://www.fda.gov/media/152166/download  Fact Sheet for Healthcare Providers: SeriousBroker.ithttps://www.fda.gov/media/152162/download  This test is not yet approved or cleared by the [place] FDA and has been authorized for detection and/or diagnosis of SARS-CoV-2 by FDA under an Emergency  Use Authorization (EUA). This EUA will remain in effect (meaning this test can be used) for the duration of the COVID-19 declaration under Section 564(b)(1) of the Act, 21 U.S.C. section 360bbb-3(b)(1), unless the authorization is terminated or revoked.  Performed at [facility], [address] [place], [place] [address]   Expectorated Sputum Assessment w Gram Stain, Rflx to Resp Cult     Status: None   Collection Time: 07/19/21  6:01 AM   Specimen: Expectorated Sputum  Result Value Ref Range Status   Specimen Description EXPSU  Final   Special Requests NONE  Final   Sputum evaluation   Final    THIS SPECIMEN IS ACCEPTABLE FOR  SPUTUM CULTURE Performed at [facility], [address] [place], [place] [address]    Report Status 07/20/2021 FINAL  Final  Culture, Respiratory w Gram Stain     Status: None (Preliminary result)   Collection Time: 07/19/21  6:01 AM  Result Value Ref Range Status   Specimen Description EXPECTORATED SPUTUM  Final   Special Requests NONE Reflexed from [id number]  Final   Gram Stain   Final    RARE WBC PRESENT,BOTH PMN AND MONONUCLEAR FEW GRAM POSITIVE COCCI RARE GRAM NEGATIVE RODS    Culture   Final    CULTURE REINCUBATED FOR BETTER GROWTH Performed at [facility], [address] [place], [place] [address]    Report Status PENDING  Incomplete  Respiratory (~20 pathogens) panel by PCR     Status: None   Collection Time: 07/19/21  9:46 PM   Specimen: Nasopharyngeal Swab; Respiratory  Result Value Ref Range Status   Adenovirus NOT DETECTED NOT DETECTED Final   Coronavirus 229E NOT DETECTED NOT DETECTED Final    Comment: (NOTE) The Coronavirus on the Respiratory Panel, DOES NOT test for the novel  Coronavirus (2019 nCoV)    Coronavirus HKU1 NOT DETECTED NOT DETECTED Final   Coronavirus NL63 NOT DETECTED NOT DETECTED Final   Coronavirus OC43 NOT DETECTED NOT DETECTED Final   Metapneumovirus NOT DETECTED NOT DETECTED Final   Rhinovirus / Enterovirus NOT DETECTED NOT DETECTED Final   Influenza A NOT DETECTED NOT DETECTED Final   Influenza B NOT DETECTED NOT DETECTED Final   Parainfluenza Virus 1 NOT DETECTED NOT DETECTED Final   Parainfluenza Virus 2 NOT DETECTED NOT DETECTED Final   Parainfluenza Virus 3 NOT DETECTED NOT DETECTED Final   Parainfluenza Virus 4 NOT DETECTED NOT DETECTED Final   Respiratory Syncytial Virus NOT DETECTED NOT DETECTED Final   Bordetella pertussis NOT DETECTED NOT DETECTED Final   Bordetella Parapertussis NOT DETECTED NOT DETECTED Final   Chlamydophila pneumoniae NOT DETECTED NOT DETECTED Final   Mycoplasma pneumoniae NOT DETECTED NOT  DETECTED Final    Comment: Performed at [facility], [address] [place], [place] [address]      Radiology Studies: No results found.    LOS: 3 days    Jacquelin Hawking, MD [facility] 07/22/2021, 10:42 AM   If 7PM-7AM, please contact night-coverage [email or web address]

## 2021-07-22 NOTE — Progress Notes (Signed)
    [facility]    Date of Admission:  07/18/2021   Total days of antibiotics 5           ID: Stephen Lee is a 31 y.o. male with newly diagnosed hiv disease with probable pneumocystis pneumonia and latent syphilis Principal Problem:   Acute respiratory failure with hypoxia (HCC) Active Problems:   Hypokalemia   Pneumocystis jiroveci pneumonia (HCC)   HIV (human immunodeficiency virus infection) (HCC)   AIDS (HCC)    Subjective: Afebrile but still having dyspnea on exertion. At [facility]  Medications:   bictegravir-emtricitabine-tenofovir AF  1 tablet Oral Daily   enoxaparin (LOVENOX) injection  40 mg Subcutaneous Q24H   mouth rinse  15 mL Mouth Rinse BID   penicillin g benzathine (BICILLIN-LA) IM  2.4 Million Units Intramuscular Weekly   predniSONE  40 mg Oral BID WC   Followed by   Melene Muller ON 07/24/2021] predniSONE  40 mg Oral Q breakfast   Followed by   Melene Muller ON 07/29/2021] predniSONE  20 mg Oral Q breakfast    Objective: Vital signs in last 24 hours: Temp:  [98 F (36.7 C)-98.5 F (36.9 C)] 98.5 F (36.9 C) (06/10 1516) Pulse Rate:  [71-90] 88 (06/10 1516) Resp:  [20-22] 20 (06/10 1516) BP: (122-144)/(78-97) 135/97 (06/10 1516) SpO2:  [91 %-100 %] 94 % (06/10 1516)  Physical Exam  Constitutional: He is oriented to person, place, and time. He appears well-developed and well-nourished. No distress.  HENT:  Mouth/Throat: Oropharynx is clear and moist. No oropharyngeal exudate.  Cardiovascular: Normal rate, regular rhythm and normal heart sounds. Exam reveals no gallop and no friction rub.  No murmur heard.  Pulmonary/Chest: Effort normal and breath sounds normal. No respiratory distress. He has no wheezes.  Abdominal: Soft. Bowel sounds are normal. He exhibits no distension. There is no tenderness.  Lymphadenopathy:  He has no cervical adenopathy.  Neurological: He is alert and oriented to person, place, and time.  Skin: Skin is warm and dry.  No rash noted. No erythema.  Psychiatric: He has a normal mood and affect. His behavior is normal.    Lab Results Recent Labs    07/20/21 0104  WBC 4.8  HGB 13.0  HCT 39.2  NA 138  K 4.4  CL 106  CO2 22  BUN <5*  CREATININE 0.92   Liver Panel Recent Labs    07/20/21 0104  PROT 7.3  ALBUMIN 3.4*  AST 36  ALT 14  ALKPHOS 91  BILITOT 0.4   Sedimentation Rate No results for input(s): "ESRSEDRATE" in the last 72 hours. C-Reactive Protein No results for input(s): "CRP" in the last 72 hours.  Microbiology: reviewed Studies/Results: No results found.   Assessment/Plan: Hiv disease = started on biktarvy during this admission. Prescription sent to transition care pharmacy for discharge medications  Pneumocystits pneumonia = still appears dyspnea on exam. Recommend to evaluate prior to discharge to see if needs home oxygen. Continue on bactrim plus prednisone taper as detailed on yesterday's note  Latent syphilis = he received his first 2.4MU PCN IM dosing on 6/9. Will arrange next dose at clinic for Friday    [facility] for Infectious Diseases Pager: [phone]  07/22/2021, 5:50 PM

## 2021-07-22 NOTE — Progress Notes (Addendum)
V.S. o2 sats 88-89 on 1 liter of oxygen on patient rt. Index finger. Put new o2 prob on rt ear lobe sats 100 % on 1 liter. V.S. per attempt to amb. B.P. rt arm 138/86 ,Pulse 102-106 S.T. Resp. 28 . Had patient stand up slowly just standing  H.R.went up to 140 S.T. . He could feel H.R. seeding up and was unable to amb. Resp went up to 40. O2 sats  wave form was very messy unable to get them. Sat patient on side of bed. Increase 02 to 2 liters and enc. Slow deep breathing, and emotional support given..H.R. came down to 96 .o2  satson 2 liters 94 % resp 23. Tim R.N. charge nurse aware

## 2021-07-23 DIAGNOSIS — J9601 Acute respiratory failure with hypoxia: Secondary | ICD-10-CM | POA: Diagnosis not present

## 2021-07-23 DIAGNOSIS — B20 Human immunodeficiency virus [HIV] disease: Secondary | ICD-10-CM | POA: Diagnosis not present

## 2021-07-23 LAB — CULTURE, RESPIRATORY W GRAM STAIN: Culture: NORMAL

## 2021-07-23 MED ORDER — SULFAMETHOXAZOLE-TRIMETHOPRIM 800-160 MG PO TABS
2.0000 | ORAL_TABLET | Freq: Four times a day (QID) | ORAL | Status: DC
  Administered 2021-07-23 – 2021-07-24 (×4): 2 via ORAL
  Filled 2021-07-23 (×4): qty 2

## 2021-07-23 NOTE — Progress Notes (Signed)
Pharmacy Antibiotic Note  Stephen Lee is a 31 y.o. male admitted on 07/18/2021 with PCP pneumonia.  Pharmacy has been consulted for Sulfamethoxazole-trimethoprim dosing.  ID is following, plan for 21 days treatment then Bactrim ppx given low CD4 count. D/w ID provider ability to change to PO on 6/11.   WBC 4.8, Scr 0.92 (last check 6/8), now afebrile   Plan: Change sulfamethoxazole-trimethoprim to 320mg  PO q6h Monitor daily WBC, temp, SCr, and clinical s/sx of infection. BMET 6/12 AM prior to d/c  Height: 5\' 11"  (180.3 cm) Weight: 75.2 kg (165 lb 12.6 oz) IBW/kg (Calculated) : 75.3  Temp (24hrs), Avg:98.1 F (36.7 C), Min:97.5 F (36.4 C), Max:98.5 F (36.9 C)  Recent Labs  Lab 07/18/21 2335 07/19/21 0036 07/19/21 0307 07/20/21 0104  WBC 9.4  --   --  4.8  CREATININE 1.11  --   --  0.92  LATICACIDVEN  --  1.6 1.5  --      Estimated Creatinine Clearance: 122.6 mL/min (by C-G formula based on SCr of 0.92 mg/dL).    No Known Allergies  Antimicrobials this admission: Ceftriaxone 6/7 >>  Azithromycin 6/7 >> 6/8 SMX-TMP 6/7 >> 6/8  Dose adjustments this admission: N/A  Microbiology results: 6/7 BCx: ngtd 6/7 Sputum: few GPC, rare GNR  6/7 pneumocystis smear by DFA: pending 6/7- strep UAg negative  Thank you for allowing pharmacy to be a part of this patient's care.  8/7 A Stephen Lee 07/23/2021 10:22 AM

## 2021-07-23 NOTE — Progress Notes (Signed)
SATURATION QUALIFICATIONS: (This note is used to comply with regulatory documentation for home oxygen)  Patient Saturations on Room Air at Rest = 89%  Patient Saturations on Room Air while Ambulating = 85%  Patient Saturations on 3 Liters of oxygen while Ambulating = 91%

## 2021-07-23 NOTE — Plan of Care (Signed)
  Problem: Activity: Goal: Ability to tolerate increased activity will improve Outcome: Progressing   Problem: Clinical Measurements: Goal: Ability to maintain a body temperature in the normal range will improve Outcome: Progressing   Problem: Respiratory: Goal: Ability to maintain adequate ventilation will improve Outcome: Progressing   Problem: Respiratory: Goal: Ability to maintain a clear airway will improve Outcome: Progressing   Problem: Fluid Volume: Goal: Hemodynamic stability will improve Outcome: Progressing   Problem: Respiratory: Goal: Ability to maintain adequate ventilation will improve Outcome: Progressing

## 2021-07-23 NOTE — Progress Notes (Signed)
PROGRESS NOTE    Stephen Lee  WJX:[id number] DOB: 1990-04-21 DOA: 07/18/2021 PCP: System, Provider Not In   Brief Narrative: Stephen AlineStefphon Muldrow is a 31 y.o. male with no prior medical history. Patient presented secondary to chest tightness and shortness of breath. He was found to have evidence of pneumonia on chest x-ray. Initial treatment for CAP initiated, however this transitioned to empiric treatment for pneumocystis pneumonia based on CD4 count and risk factors. Patient with associated hypoxia requiring supplemental oxygen.   Assessment and Plan:  Pneumocystis pneumonia Patient initially treated empirically with Ceftriaxone and azithromycin for presumed community acquired pneumonia. CD4 count low. ID consulted with recommendations for treatment of presumed pneumocystis pneumonia. Patient started on Bactrim IV and steroid taper. -Continue Bactrim (21 day course) -Continue Prednisone (taper over 21 days: 80 mg x5, 40 mg x5, 20 mg x11)  Acute respiratory failure with hypoxia Secondary to above. Patient requiring up to 5 L/min of oxygen and has been weaned down to 2 L/min. Unfortunately, patient with significant functional impairment at this time, complicating ability to discharge -Wean to room air -Ambulatory pulse ox daily -PT/OT eval  HIV/AIDS CD4 count of 115 and HIV viral load of 580,000 copies/mL. Patient started on Biktarvy this admission.  -Continue Biktarvy -Outpatient ID follow-up  Latent syphilis ID consulted. Recommendations for 3 weekly doses of IM penicillin  Hypokalemia Treated with potassium supplementation. Resolved.   DVT prophylaxis: Lovenox Code Status:   Code Status: Full Code Family Communication: None at bedside Disposition Plan: Discharge home in 1-2 days pending ability to wean oxygen to room air and improved functional capacity to achieve ADLs   Consultants:  Infectious disease  Procedures:   None  Antimicrobials: Ceftriaxone Azithromycin Penicillin G Bactrim Biktarvy    Subjective: Patient without many symptoms at rest, but during attempts to ambulate yesterday, patient with significant dyspnea on exertion and palpitations.  Objective: BP 139/80 (BP Location: Right Arm)   Pulse 98   Temp 97.7 F (36.5 C) (Oral)   Resp 20   Ht 5\' 11"  (1.803 m)   Wt 75.2 kg   SpO2 94%   BMI 23.12 kg/m   Examination:  General exam: Appears calm and comfortable Respiratory system: Clear to auscultation. Respiratory effort normal. Cardiovascular system: S1 & S2 heard, RRR. 1/6 systolic murmur. Gastrointestinal system: Abdomen is mildly distended, soft and nontender. Normal bowel sounds heard. Central nervous system: Alert and oriented. No focal neurological deficits. Musculoskeletal: No edema. No calf tenderness Skin: No cyanosis. No rashes Psychiatry: Judgement and insight appear normal. Mood & affect appropriate.    Data Reviewed: I have personally reviewed following labs and imaging studies  CBC Lab Results  Component Value Date   WBC 4.8 07/20/2021   RBC 4.26 07/20/2021   HGB 13.0 07/20/2021   HCT 39.2 07/20/2021   MCV 92.0 07/20/2021   MCH 30.5 07/20/2021   PLT 235 07/20/2021   MCHC 33.2 07/20/2021   RDW 13.6 07/20/2021   LYMPHSABS 0.3 (L) 07/20/2021   MONOABS 0.5 07/20/2021   EOSABS 0.0 07/20/2021   BASOSABS 0.0 07/20/2021     Last metabolic panel Lab Results  Component Value Date   NA 138 07/20/2021   K 4.4 07/20/2021   CL 106 07/20/2021   CO2 22 07/20/2021   BUN <5 (L) 07/20/2021   CREATININE 0.92 07/20/2021   GLUCOSE 140 (H) 07/20/2021   GFRNONAA >60 07/20/2021   GFRAA >60 03/04/2019   CALCIUM 9.0 07/20/2021   PROT 7.3 07/20/2021   ALBUMIN  3.4 (L) 07/20/2021   BILITOT 0.4 07/20/2021   ALKPHOS 91 07/20/2021   AST 36 07/20/2021   ALT 14 07/20/2021   ANIONGAP 10 07/20/2021    GFR: Estimated Creatinine Clearance: 122.6 mL/min (by C-G  formula based on SCr of 0.92 mg/dL).  Recent Results (from the past 240 hour(s))  Blood Culture (routine x 2)     Status: None (Preliminary result)   Collection Time: 07/19/21  1:05 AM   Specimen: BLOOD LEFT HAND  Result Value Ref Range Status   Specimen Description BLOOD LEFT HAND  Final   Special Requests   Final    BOTTLES DRAWN AEROBIC AND ANAEROBIC Blood Culture results may not be optimal due to an inadequate volume of blood received in culture bottles   Culture   Final    NO GROWTH 3 DAYS Performed at [facility], [address] [place], [place] [address]    Report Status PENDING  Incomplete  Blood Culture (routine x 2)     Status: None (Preliminary result)   Collection Time: 07/19/21  1:10 AM   Specimen: BLOOD  Result Value Ref Range Status   Specimen Description BLOOD RIGHT ANTECUBITAL  Final   Special Requests   Final    BOTTLES DRAWN AEROBIC AND ANAEROBIC Blood Culture results may not be optimal due to an inadequate volume of blood received in culture bottles   Culture   Final    NO GROWTH 3 DAYS Performed at [facility], [address], [place], [place] [address]    Report Status PENDING  Incomplete  Resp Panel by RT-PCR (Flu A&B, Covid) Anterior Nasal Swab     Status: None   Collection Time: 07/19/21  3:15 AM   Specimen: Anterior Nasal Swab  Result Value Ref Range Status   SARS Coronavirus 2 by RT PCR NEGATIVE NEGATIVE Final    Comment: (NOTE) SARS-CoV-2 target nucleic acids are NOT DETECTED.  The SARS-CoV-2 RNA is generally detectable in upper respiratory specimens during the acute phase of infection. The lowest concentration of SARS-CoV-2 viral copies this assay can detect is 138 copies/mL. A negative result does not preclude SARS-Cov-2 infection and should not be used as the sole basis for treatment or other patient management decisions. A negative result may occur with  improper specimen collection/handling, submission of specimen other than  nasopharyngeal swab, presence of viral mutation(s) within the areas targeted by this assay, and inadequate number of viral copies(<138 copies/mL). A negative result must be combined with clinical observations, patient history, and epidemiological information. The expected result is Negative.  Fact Sheet for Patients:  BloggerCourse.com  Fact Sheet for Healthcare Providers:  SeriousBroker.it  This test is no t yet approved or cleared by the [place] FDA and  has been authorized for detection and/or diagnosis of SARS-CoV-2 by FDA under an Emergency Use Authorization (EUA). This EUA will remain  in effect (meaning this test can be used) for the duration of the COVID-19 declaration under Section 564(b)(1) of the Act, 21 U.S.C.section 360bbb-3(b)(1), unless the authorization is terminated  or revoked sooner.       Influenza A by PCR NEGATIVE NEGATIVE Final   Influenza B by PCR NEGATIVE NEGATIVE Final    Comment: (NOTE) The Xpert Xpress SARS-CoV-2/FLU/RSV plus assay is intended as an aid in the diagnosis of influenza from Nasopharyngeal swab specimens and should not be used as a sole basis for treatment. Nasal washings and aspirates are unacceptable for Xpert Xpress SARS-CoV-2/FLU/RSV testing.  Fact Sheet for Patients: BloggerCourse.com  Fact Sheet for Healthcare Providers: SeriousBroker.it  This test is not yet approved or cleared by the [place] FDA and has been authorized for detection and/or diagnosis of SARS-CoV-2 by FDA under an Emergency Use Authorization (EUA). This EUA will remain in effect (meaning this test can be used) for the duration of the COVID-19 declaration under Section 564(b)(1) of the Act, 21 U.S.C. section 360bbb-3(b)(1), unless the authorization is terminated or revoked.  Performed at [facility], [address] [place], [place] [address]    Expectorated Sputum Assessment w Gram Stain, Rflx to Resp Cult     Status: None   Collection Time: 07/19/21  6:01 AM   Specimen: Expectorated Sputum  Result Value Ref Range Status   Specimen Description EXPSU  Final   Special Requests NONE  Final   Sputum evaluation   Final    THIS SPECIMEN IS ACCEPTABLE FOR SPUTUM CULTURE Performed at [facility], [address], [place], [place] [address]    Report Status 07/20/2021 FINAL  Final  Culture, Respiratory w Gram Stain     Status: None   Collection Time: 07/19/21  6:01 AM  Result Value Ref Range Status   Specimen Description EXPECTORATED SPUTUM  Final   Special Requests NONE Reflexed from [id number]  Final   Gram Stain   Final    RARE WBC PRESENT,BOTH PMN AND MONONUCLEAR FEW GRAM POSITIVE COCCI RARE GRAM NEGATIVE RODS    Culture   Final    Normal respiratory flora-no Staph aureus or Pseudomonas seen Performed at [facility], [address] [place], [place] [address]    Report Status 07/23/2021 FINAL  Final  Respiratory (~20 pathogens) panel by PCR     Status: None   Collection Time: 07/19/21  9:46 PM   Specimen: Nasopharyngeal Swab; Respiratory  Result Value Ref Range Status   Adenovirus NOT DETECTED NOT DETECTED Final   Coronavirus 229E NOT DETECTED NOT DETECTED Final    Comment: (NOTE) The Coronavirus on the Respiratory Panel, DOES NOT test for the novel  Coronavirus (2019 nCoV)    Coronavirus HKU1 NOT DETECTED NOT DETECTED Final   Coronavirus NL63 NOT DETECTED NOT DETECTED Final   Coronavirus OC43 NOT DETECTED NOT DETECTED Final   Metapneumovirus NOT DETECTED NOT DETECTED Final   Rhinovirus / Enterovirus NOT DETECTED NOT DETECTED Final   Influenza A NOT DETECTED NOT DETECTED Final   Influenza B NOT DETECTED NOT DETECTED Final   Parainfluenza Virus 1 NOT DETECTED NOT DETECTED Final   Parainfluenza Virus 2 NOT DETECTED NOT DETECTED Final   Parainfluenza Virus 3 NOT DETECTED NOT DETECTED Final   Parainfluenza  Virus 4 NOT DETECTED NOT DETECTED Final   Respiratory Syncytial Virus NOT DETECTED NOT DETECTED Final   Bordetella pertussis NOT DETECTED NOT DETECTED Final   Bordetella Parapertussis NOT DETECTED NOT DETECTED Final   Chlamydophila pneumoniae NOT DETECTED NOT DETECTED Final   Mycoplasma pneumoniae NOT DETECTED NOT DETECTED Final    Comment: Performed at [facility], [address], [place], [place] [address]      Radiology Studies: No results found.    LOS: 4 days    Jacquelin Hawking, MD [facility] 07/23/2021, 1:13 PM   If 7PM-7AM, please contact night-coverage [email or web address]

## 2021-07-23 NOTE — Progress Notes (Signed)
Mobility Specialist Progress Note    07/23/21 1845  Mobility  Activity Ambulated independently in hallway  Level of Assistance Standby assist, set-up cues, supervision of patient - no hands on  Assistive Device None  Distance Ambulated (ft) 300 ft  Activity Response Tolerated well  $Mobility charge 1 Mobility   Pre-Mobility: 91 HR, 138/92 BP, 90% SpO2 During Mobility: 130 HR, 85% on RA SpO2 Post-Mobility: 113 HR, 92% on 2L SpO2  Pt received in bed and agreeable. Attempted to wean but pt desatted to 85% on RA. Needed 3LO2 to recover to 91%. Left in bed with call bell in reach.   Hildred Alamin Mobility Specialist

## 2021-07-24 ENCOUNTER — Other Ambulatory Visit (HOSPITAL_COMMUNITY): Payer: Self-pay

## 2021-07-24 DIAGNOSIS — B20 Human immunodeficiency virus [HIV] disease: Secondary | ICD-10-CM | POA: Diagnosis not present

## 2021-07-24 DIAGNOSIS — B59 Pneumocystosis: Secondary | ICD-10-CM | POA: Diagnosis not present

## 2021-07-24 LAB — URINE CYTOLOGY ANCILLARY ONLY
Chlamydia: NEGATIVE
Comment: NEGATIVE
Comment: NORMAL
Neisseria Gonorrhea: NEGATIVE

## 2021-07-24 LAB — BASIC METABOLIC PANEL
Anion gap: 10 (ref 5–15)
BUN: 15 mg/dL (ref 6–20)
CO2: 22 mmol/L (ref 22–32)
Calcium: 8.9 mg/dL (ref 8.9–10.3)
Chloride: 103 mmol/L (ref 98–111)
Creatinine, Ser: 1.1 mg/dL (ref 0.61–1.24)
GFR, Estimated: >60 mL/min (ref >60)
Glucose, Bld: 111 mg/dL — ABNORMAL HIGH (ref 70–99)
Potassium: 4.4 mmol/L (ref 3.5–5.1)
Sodium: 135 mmol/L (ref 135–145)

## 2021-07-24 LAB — CULTURE, BLOOD (ROUTINE X 2)
Culture: NO GROWTH
Culture: NO GROWTH

## 2021-07-24 LAB — T.PALLIDUM AB, TOTAL: T Pallidum Abs: REACTIVE — AB

## 2021-07-24 MED ORDER — BICTEGRAVIR-EMTRICITAB-TENOFOV 50-200-25 MG PO TABS
1.0000 | ORAL_TABLET | Freq: Every day | ORAL | 2 refills | Status: DC
  Filled 2021-07-24 – 2021-08-22 (×5): qty 30, 30d supply, fill #0
  Filled 2021-09-12: qty 30, 30d supply, fill #1

## 2021-07-24 MED ORDER — SULFAMETHOXAZOLE-TRIMETHOPRIM 800-160 MG PO TABS
2.0000 | ORAL_TABLET | Freq: Three times a day (TID) | ORAL | 0 refills | Status: AC
  Filled 2021-07-24: qty 102, 17d supply, fill #0

## 2021-07-24 MED ORDER — PREDNISONE 20 MG PO TABS
ORAL_TABLET | ORAL | 0 refills | Status: AC
  Filled 2021-07-24: qty 30, 15d supply, fill #0

## 2021-07-24 MED ORDER — SULFAMETHOXAZOLE-TRIMETHOPRIM 800-160 MG PO TABS
2.0000 | ORAL_TABLET | Freq: Three times a day (TID) | ORAL | Status: DC
  Administered 2021-07-24: 2 via ORAL
  Filled 2021-07-24: qty 2

## 2021-07-24 NOTE — Progress Notes (Signed)
Discharge instructions given. Patient verbalized understanding and all questions were answered.  ?

## 2021-07-24 NOTE — Progress Notes (Signed)
    [facility] for Infectious Disease    Date of Admission:  07/18/2021   Total days of antibiotics 7/day 2 bactrim(oral)          ID: Stephen Lee is a 31 y.o. male with  PCP pneumonia, with newly diagnosed HIV disease, and latent syphilis Principal Problem:   Acute respiratory failure with hypoxia ([place]) Active Problems:   Hypokalemia   Pneumocystis jiroveci pneumonia ([facility])   HIV (human immunodeficiency virus infection) ([place])   AIDS ([place])    Subjective: Afebrile, still having some DOE with ambulation  Medications:   bictegravir-emtricitabine-tenofovir AF  1 tablet Oral Daily   enoxaparin (LOVENOX) injection  40 mg Subcutaneous Q24H   mouth rinse  15 mL Mouth Rinse BID   penicillin g benzathine (BICILLIN-LA) IM  2.4 Million Units Intramuscular Weekly   predniSONE  40 mg Oral Q breakfast   Followed by   Derrill Memo ON 07/29/2021] predniSONE  20 mg Oral Q breakfast   sulfamethoxazole-trimethoprim  2 tablet Oral TID    Objective: Vital signs in last 24 hours: Temp:  [97.6 F (36.4 C)-98.1 F (36.7 C)] 97.7 F (36.5 C) (06/12 0844) Pulse Rate:  [75-101] 89 (06/12 1034) Resp:  [18-20] 20 (06/12 1034) BP: (121-139)/(83-98) 139/94 (06/12 1034) SpO2:  [93 %-95 %] 93 % (06/12 1034) Physical Exam  Constitutional: He is oriented to person, place, and time. He appears well-developed and well-nourished. No distress.  HENT:  Mouth/Throat: Oropharynx is clear and moist. No oropharyngeal exudate.  Cardiovascular: Normal rate, regular rhythm and normal heart sounds. Exam reveals no gallop and no friction rub.  No murmur heard.  Pulmonary/Chest: Effort normal and breath sounds normal. No respiratory distress. He has no wheezes.  Abdominal: Soft. Bowel sounds are normal. He exhibits no distension. There is no tenderness.  Lymphadenopathy:  He has no cervical adenopathy.  Neurological: He is alert and oriented to person, place, and time.  Skin: Skin is warm and dry. No rash noted. No  erythema.  Psychiatric: He has a normal mood and affect. His behavior is normal.   Lab Results Recent Labs    07/24/21 0559  NA 135  K 4.4  CL 103  CO2 22  BUN 15  CREATININE 1.10   Fungitell -370  Microbiology: reviewed Studies/Results: No results found.   Assessment/Plan: Newly diagnosed hiv disease = started on biktarvy. VL 580K. Recommend to get VL genotype in clinic this week. Will review adherence at clinic visit, provide refills  Pneumocystitis pneumonia = clinically c/w PJP plus fungitell at 370+. Received 5 days of IV bactrim, now on oral bactrim 2 DS TID to complete 21 day course then narrow down to 1 ds tab daily. On pred taper as well.  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 Next dose 6/16 in clinic  Spent 50 min in face to face and non- face to face time for management of above process. Have Arranged follow up appt and discussed treatment plan with patient and provider  [facility] for Infectious Diseases Pager: [phone]  07/24/2021, 2:17 PM

## 2021-07-24 NOTE — Progress Notes (Signed)
[facility]  Date of Admission:  07/18/2021      Total days of antibiotics 5   Bactrim 6/07 >> current           ASSESSMENT: Stephen Lee is a 31 y.o. 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 Treating for PJP pneumonia on day 7 of 21. Feeling better but slow to resolve hypoxia. Planning D/C with home O2 for now. Afebrile. Tolerating PO medications without side effects.  Continue Biktarvy once daily for treatment. We spent time discussing schedule for antibiotics, prednisone and biktarvy treatment.   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    Principal Problem:   Acute respiratory failure with hypoxia (HCC) Active Problems:   Hypokalemia   Pneumocystis jiroveci pneumonia (HCC)   HIV (human immunodeficiency virus infection) (HCC)   AIDS (HCC)   . bictegravir-emtricitabine-tenofovir AF  1 tablet Oral Daily  . enoxaparin (LOVENOX) injection  40 mg Subcutaneous Q24H  . mouth rinse  15 mL Mouth Rinse BID  . penicillin g benzathine (BICILLIN-LA) IM  2.4 Million Units Intramuscular Weekly  . predniSONE  40 mg Oral Q breakfast   Followed by  . [START ON 07/29/2021] predniSONE  20 mg Oral Q breakfast  . sulfamethoxazole-trimethoprim  2 tablet Oral TID    SUBJECTIVE: Breathing is better - still a little short but improved.  Has been walking in the hall better; eating better.  Has a few questions about the clinic.    Review of Systems: Review of Systems  Constitutional:  Negative for chills and fever.  HENT:  Negative for tinnitus.   Eyes:  Negative for blurred vision and photophobia.  Respiratory:  Positive for cough. Negative for sputum  production, shortness of breath and wheezing.   Cardiovascular:  Negative for chest pain.  Gastrointestinal:  Negative for abdominal pain, diarrhea, nausea and vomiting.  Genitourinary:  Negative for dysuria.  Skin:  Negative for rash.  Neurological:  Negative for headaches.    No Known Allergies  OBJECTIVE: Vitals:   07/24/21 0319 07/24/21 0524 07/24/21 0844 07/24/21 1034  BP: (!) 127/93  (!) 135/98 (!) 139/94  Pulse: 75  79 89  Resp: Temp: 98 F (36.7 C)  97.7 F (36.5 C)   TempSrc: Oral  Oral   SpO2: 93% 94% 95% 93%  Weight:      Height:       Body mass index is 23.12 kg/m.  Physical Exam Vitals and nursing note reviewed.  HENT:     Mouth/Throat:     Mouth: Mucous membranes are moist.     Pharynx: Oropharynx is clear. No oropharyngeal exudate.  Cardiovascular:     Rate and Rhythm: Normal rate and regular rhythm.  Pulmonary:     Effort: Pulmonary effort is normal.     Breath sounds: Normal breath sounds.     Comments: Clear to auscultation; mild dry cough. Can get through full conversations without getting breathless.  Abdominal:     General: Bowel sounds are normal.     Palpations: Abdomen is soft.  Skin:  General: Skin is warm and dry.  Neurological:     Mental Status: He is alert and oriented to person, place, and time.    Lab Results Lab Results  Component Value Date   WBC 4.8 07/20/2021   HGB 13.0 07/20/2021   HCT 39.2 07/20/2021   MCV 92.0 07/20/2021   PLT 235 07/20/2021    Lab Results  Component Value Date   CREATININE 1.10 07/24/2021   BUN 15 07/24/2021   NA 135 07/24/2021   K 4.4 07/24/2021   CL 103 07/24/2021   CO2 22 07/24/2021    Lab Results  Component Value Date   ALT 14 07/20/2021   AST 36 07/20/2021   ALKPHOS 91 07/20/2021   BILITOT 0.4 07/20/2021     Microbiology: Recent Results (from the past 240 hour(s))  Blood Culture (routine x 2)     Status: None   Collection Time: 07/19/21  1:05 AM   Specimen: BLOOD  LEFT HAND  Result Value Ref Range Status   Specimen Description BLOOD LEFT HAND  Final   Special Requests   Final    BOTTLES DRAWN AEROBIC AND ANAEROBIC Blood Culture results may not be optimal due to an inadequate volume of blood received in culture bottles   Culture   Final    NO GROWTH 5 DAYS Performed at [facility], [address] [place], [place] [address]    Report Status 07/24/2021 FINAL  Final  Blood Culture (routine x 2)     Status: None   Collection Time: 07/19/21  1:10 AM   Specimen: BLOOD  Result Value Ref Range Status   Specimen Description BLOOD RIGHT ANTECUBITAL  Final   Special Requests   Final    BOTTLES DRAWN AEROBIC AND ANAEROBIC Blood Culture results may not be optimal due to an inadequate volume of blood received in culture bottles   Culture   Final    NO GROWTH 5 DAYS Performed at [facility], [address] [place], [place] [address]    Report Status 07/24/2021 FINAL  Final  Resp Panel by RT-PCR (Flu A&B, Covid) Anterior Nasal Swab     Status: None   Collection Time: 07/19/21  3:15 AM   Specimen: Anterior Nasal Swab  Result Value Ref Range Status   SARS Coronavirus 2 by RT PCR NEGATIVE NEGATIVE Final    Comment: (NOTE) SARS-CoV-2 target nucleic acids are NOT DETECTED.  The SARS-CoV-2 RNA is generally detectable in upper respiratory specimens during the acute phase of infection. The lowest concentration of SARS-CoV-2 viral copies this assay can detect is 138 copies/mL. A negative result does not preclude SARS-Cov-2 infection and should not be used as the sole basis for treatment or other patient management decisions. A negative result may occur with  improper specimen collection/handling, submission of specimen other than nasopharyngeal swab, presence of viral mutation(s) within the areas targeted by this assay, and inadequate number of viral copies(<138 copies/mL). A negative result must be combined with clinical observations, patient  history, and epidemiological information. The expected result is Negative.  Fact Sheet for Patients:  BloggerCourse.comhttps://www.fda.gov/media/152166/download  Fact Sheet for Healthcare Providers:  SeriousBroker.ithttps://www.fda.gov/media/152162/download  This test is no t yet approved or cleared by the [place] FDA and  has been authorized for detection and/or diagnosis of SARS-CoV-2 by FDA under an Emergency Use Authorization (EUA). This EUA will remain  in effect (meaning this test can be used) for the duration of the COVID-19 declaration under Section 564(b)(1) of  the Act, 21 U.S.C.section 360bbb-3(b)(1), unless the authorization is terminated  or revoked sooner.       Influenza A by PCR NEGATIVE NEGATIVE Final   Influenza B by PCR NEGATIVE NEGATIVE Final    Comment: (NOTE) The Xpert Xpress SARS-CoV-2/FLU/RSV plus assay is intended as an aid in the diagnosis of influenza from Nasopharyngeal swab specimens and should not be used as a sole basis for treatment. Nasal washings and aspirates are unacceptable for Xpert Xpress SARS-CoV-2/FLU/RSV testing.  Fact Sheet for Patients: BloggerCourse.com  Fact Sheet for Healthcare Providers: SeriousBroker.it  This test is not yet approved or cleared by the [place] FDA and has been authorized for detection and/or diagnosis of SARS-CoV-2 by FDA under an Emergency Use Authorization (EUA). This EUA will remain in effect (meaning this test can be used) for the duration of the COVID-19 declaration under Section 564(b)(1) of the Act, 21 U.S.C. section 360bbb-3(b)(1), unless the authorization is terminated or revoked.  Performed at [facility], [address] [place], [place] [address]   Expectorated Sputum Assessment w Gram Stain, Rflx to Resp Cult     Status: None   Collection Time: 07/19/21  6:01 AM   Specimen: Expectorated Sputum  Result Value Ref Range Status   Specimen Description EXPSU   Final   Special Requests NONE  Final   Sputum evaluation   Final    THIS SPECIMEN IS ACCEPTABLE FOR SPUTUM CULTURE Performed at [facility], [address] [place], [place] [address]    Report Status 07/20/2021 FINAL  Final  Culture, Respiratory w Gram Stain     Status: None   Collection Time: 07/19/21  6:01 AM  Result Value Ref Range Status   Specimen Description EXPECTORATED SPUTUM  Final   Special Requests NONE Reflexed from [id number]  Final   Gram Stain   Final    RARE WBC PRESENT,BOTH PMN AND MONONUCLEAR FEW GRAM POSITIVE COCCI RARE GRAM NEGATIVE RODS    Culture   Final    Normal respiratory flora-no Staph aureus or Pseudomonas seen Performed at [facility], [address], [place], [place] [address]    Report Status 07/23/2021 FINAL  Final  Respiratory (~20 pathogens) panel by PCR     Status: None   Collection Time: 07/19/21  9:46 PM   Specimen: Nasopharyngeal Swab; Respiratory  Result Value Ref Range Status   Adenovirus NOT DETECTED NOT DETECTED Final   Coronavirus 229E NOT DETECTED NOT DETECTED Final    Comment: (NOTE) The Coronavirus on the Respiratory Panel, DOES NOT test for the novel  Coronavirus (2019 nCoV)    Coronavirus HKU1 NOT DETECTED NOT DETECTED Final   Coronavirus NL63 NOT DETECTED NOT DETECTED Final   Coronavirus OC43 NOT DETECTED NOT DETECTED Final   Metapneumovirus NOT DETECTED NOT DETECTED Final   Rhinovirus / Enterovirus NOT DETECTED NOT DETECTED Final   Influenza A NOT DETECTED NOT DETECTED Final   Influenza B NOT DETECTED NOT DETECTED Final   Parainfluenza Virus 1 NOT DETECTED NOT DETECTED Final   Parainfluenza Virus 2 NOT DETECTED NOT DETECTED Final   Parainfluenza Virus 3 NOT DETECTED NOT DETECTED Final   Parainfluenza Virus 4 NOT DETECTED NOT DETECTED Final   Respiratory Syncytial Virus NOT DETECTED NOT DETECTED Final   Bordetella pertussis NOT DETECTED NOT DETECTED Final   Bordetella Parapertussis NOT DETECTED NOT DETECTED Final    Chlamydophila pneumoniae NOT DETECTED NOT DETECTED Final   Mycoplasma pneumoniae NOT DETECTED NOT DETECTED Final  Comment: Performed at [facility], [address] [place], [place] [address]     [name], MSN, NP-C [facility]  [email or web address] Pager: [phone] Office: [phone] [facility] Main Line: [phone] *Secure Chat Communication Welcome

## 2021-07-24 NOTE — Progress Notes (Signed)
SATURATION QUALIFICATIONS: (This note is used to comply with regulatory documentation for home oxygen)  Patient Saturations on Room Air at Rest = 91%  Patient Saturations on Room Air while Ambulating = 83%  Patient Saturations on 2 Liters of oxygen while Ambulating = 93%  Please briefly explain why patient needs home oxygen: SpO2 dropped to lowest of 83% on RA during ambulation, donned 2L [place] and SpO2 maintained >90%.

## 2021-07-24 NOTE — TOC Transition Note (Addendum)
Transition of Care (TOC) - CM/SW Discharge Note Donn Pierini RN, BSN Transitions of Care Unit 4E- RN Case Manager See Treatment Team for direct phone #    Patient Details  Name: Stephen Lee MRN: [id number] Date of Birth: 1990/03/11  Transition of Care [facility]) CM/SW Contact:  Darrold Span, RN Phone Number: 07/24/2021, 11:39 AM   Clinical Narrative:    Pt stable for transition home today, CM follow up on home 02 needs. Spoke with pt at bedside discussed home 02 needs. Pt voiced he does not have a preference and defers to TOC to secure home 02 needs. Pt agreeable to using [employer] for home 02.  Address- confirmed with pt as: [address] [place] [address] Phone # correct in epic.  Per pt he will be going to the [facility] IM clinic on the ground floor of hospital for primary care needs.  And followed by the ID clinic.   Stephanie-NP with ID to the BS during conversation to speak with pt as well.   Call made to [facility] with [employer] for home 02 needs- portable tank to be delivered to room prior to discharge. Pt will call [employer] on return home for concentrator delivery to the home.     Final next level of care: Home/Self Care Barriers to Discharge: No Barriers Identified, Barriers Resolved   Patient Goals and CMS Choice Patient states their goals for this hospitalization and ongoing recovery are:: return home CMS Medicare.gov Compare Post Acute Care list provided to:: Patient Choice offered to / list presented to : Patient  Discharge Placement                 Home      Discharge Plan and Services   Discharge Planning Services: CM Consult Post Acute Care Choice: Durable Medical Equipment          DME Arranged: Oxygen DME Agency: [employer] Date DME Agency Contacted: 07/24/21 Time DME Agency Contacted: 1135 Representative spoke with at DME Agency: Vaughan Basta HH Arranged: NA HH Agency: NA        Social Determinants of Health (SDOH) Interventions      Readmission Risk Interventions    07/24/2021   11:35 AM  Readmission Risk Prevention Plan  Post Dischage Appt Complete  Medication Screening Complete  Transportation Screening Complete

## 2021-07-24 NOTE — Evaluation (Signed)
Physical Therapy Evaluation Patient Details Name: Stephen Lee MRN: [id number] DOB: 1990/04/18 Today's Date: 07/24/2021  History of Present Illness  Pt is a 31 yo male presenting with chest tightness and SOB with an intermittent cough. Pt admitted on 07/18/21 with acute respiratory failure with PCP pneumonia bilaterally. Recent diagnosis of HIV. PMH includes depression and Hypokalemia.  Clinical Impression  Pt admitted with above diagnosis. PTA pt was independent with bed mobility, transfers, ambulation, and ADLs/iADLs. Pt lives with a brother and friend, brother is home 24/7 and available PRN. At the time of PT eval, pt was able to demonstrate transfers and ambulation with modified independence and no AD. Pt tolerated 436ft of ambulation, SpO2 dropped to low of 83% on RA, donned 2L [place] and able to maintain SpO2 >90%. HR ranged 65-85 bpm. Pt tolerated higher level balance training with head turns and change in speed without LOB while ambulating. He is currently safe to d/c home from a PT perspective and anticipates d/c home this afternoon. Will sign off, please re-consult if needs change.         Recommendations for follow up therapy are one component of a multi-disciplinary discharge planning process, led by the attending physician.  Recommendations may be updated based on patient status, additional functional criteria and insurance authorization.  Follow Up Recommendations No PT follow up    Assistance Recommended at Discharge PRN  Patient can return home with the following       Equipment Recommendations None recommended by PT  Recommendations for Other Services       Functional Status Assessment       Precautions / Restrictions Precautions Precautions: None Restrictions Weight Bearing Restrictions: No      Mobility  Bed Mobility Overal bed mobility: Modified Independent Bed Mobility: Rolling, Sidelying to Sit Rolling: Modified independent (Device/Increase time) Sidelying to  sit: Modified independent (Device/Increase time)       General bed mobility comments: pt modI with increased time    Transfers Overall transfer level: Modified independent Equipment used: None               General transfer comment: from EOB x1, transferred to chair post-ambulation. pt modI with increased time    Ambulation/Gait Ambulation/Gait assistance: Modified independent (Device/Increase time) Gait Distance (Feet): 470 Feet Assistive device: None Gait Pattern/deviations: Step-through pattern, Decreased stride length, Narrow base of support Gait velocity: decreased Gait velocity interpretation: 1.31 - 2.62 ft/sec, indicative of limited community ambulator   General Gait Details: pt with slow but steady gait speed. modI for ambulation for decreased gait speed. SpO2 maintained >90% on RA, during ambulation SpO2 dropped to low of 83%, donned 2L [place] and SpO2 able to be maintained >90%.  Stairs            Wheelchair Mobility    Modified Rankin (Stroke Patients Only)       Balance Overall balance assessment: Modified Independent                                           Pertinent Vitals/Pain Pain Assessment Pain Assessment: No/denies pain    Home Living Family/patient expects to be discharged to:: Private residence Living Arrangements: Other relatives;Non-relatives/Friends Available Help at Discharge: Family;Available 24 hours/day;Available PRN/intermittently;Friend(s) Type of Home: House Home Access: Other (comment) (threshold to enter)       Home Layout: One level Home Equipment: None  Prior Function Prior Level of Function : Independent/Modified Independent             Mobility Comments: independent, 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, 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) ADLs Comments: independent, 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        Extremity/Trunk Assessment   Upper Extremity  Assessment Upper Extremity Assessment: Defer to OT evaluation    Lower Extremity Assessment Lower Extremity Assessment: Overall WFL for tasks assessed       Communication   Communication: No difficulties  Cognition Arousal/Alertness: Awake/alert Behavior During Therapy: WFL for tasks assessed/performed Overall Cognitive Status: Within Functional Limits for tasks assessed                                          General Comments General comments (skin integrity, edema, etc.): pt with modI for balance with increased time and apprehension for head turns side to side and up/down, change in speed without LOB.    Exercises     Assessment/Plan    PT Assessment Patient does not need any further PT services  PT Problem List         PT Treatment Interventions      PT Goals (Current goals can be found in the Care Plan section)  Acute Rehab PT Goals Patient Stated Goal: to return home PT Goal Formulation: With patient Time For Goal Achievement: 08/07/21 Potential to Achieve Goals: Good    Frequency       Co-evaluation               AM-PAC PT "6 Clicks" Mobility  Outcome Measure Help needed turning from your back to your side while in a flat bed without using bedrails?: None Help needed moving from lying on your back to sitting on the side of a flat bed without using bedrails?: None Help needed moving to and from a bed to a chair (including a wheelchair)?: None Help needed standing up from a chair using your arms (e.g., wheelchair or bedside chair)?: None Help needed to walk in hospital room?: None Help needed climbing 3-5 steps with a railing? : None 6 Click Score: 24    End of Session Equipment Utilized During Treatment: Gait belt Activity Tolerance: Patient tolerated treatment well Patient left: in chair;with call bell/phone within reach Nurse Communication: Mobility status PT Visit Diagnosis: Unsteadiness on feet (R26.81);Other abnormalities  of gait and mobility (R26.89)    Time: 1027-1100 PT Time Calculation (min) (ACUTE ONLY): 33 min   Charges:   PT Evaluation $PT Eval Moderate Complexity: 1 Mod PT Treatments $Therapeutic Activity: 8-22 mins        Melvyn Novas, MS, SPT Acute Rehabilitation Services Office: [phone]  Melvyn Novas 07/24/2021, 1:38 PM

## 2021-07-24 NOTE — Discharge Instructions (Signed)
Stephen Lee,,  You were in the hospital with pneumonia and new diagnosis of HIV. You have been started on antibiotics and antiviral medication for treatment/management. You will also need to receive penicillin for latent syphilis. Please follow-up with the infectious disease physician. Please complete your antibiotics (Bactrim) and steroids (Prednisone). We discussed ensuring your partner(s) is (are) also aware of this diagnosis and should seek testing.

## 2021-07-24 NOTE — Discharge Summary (Signed)
Physician Discharge Summary   Patient: Stephen Lee MRN: [id number] DOB: 11-Dec-1990  Admit date:     07/18/2021  Discharge date: 07/24/21  Discharge Physician: Jacquelin Hawking, MD   PCP: System, Provider Not In   Recommendations at discharge:  Infectious disease follow-up for penicillin injections  Discharge Diagnoses: Principal Problem:   Acute respiratory failure with hypoxia [facility]) Active Problems:   Pneumocystis jiroveci pneumonia (HCC)   Hypokalemia   HIV (human immunodeficiency virus infection) (HCC)   AIDS (HCC)  Resolved Problems:   * No resolved hospital problems. *  Hospital Course: Stephen Lee is a 31 y.o. male with no prior medical history. Patient presented secondary to chest tightness and shortness of breath. He was found to have evidence of pneumonia on chest x-ray. Initial treatment for CAP initiated, however this transitioned to empiric treatment for pneumocystis pneumonia based on CD4 count and risk factors. Patient with associated hypoxia requiring supplemental oxygen. Patient managed on Bactrim and prednisone with taper. Supplemental oxygen provided on discharge.  Assessment and Plan: Pneumocystis pneumonia Patient initially treated empirically with Ceftriaxone and azithromycin for presumed community acquired pneumonia. CD4 count low. ID consulted with recommendations for treatment of presumed pneumocystis pneumonia. Patient started on Bactrim IV and steroid taper. -Continue Bactrim (21 day course) -Continue Prednisone (taper over 21 days: 80 mg x5, 40 mg x5, 20 mg x11)   Acute respiratory failure with hypoxia Secondary to above. Patient requiring up to 5 L/min of oxygen and has been weaned down to 2 L/min at rest. Patient needing as much as 3-4 L/min with ambulation. Discharged with home oxygen. No PT/OT needs for discharge.   HIV/AIDS CD4 count of 115 and HIV viral load of 580,000 copies/mL. Patient started on Biktarvy this admission. Continue Biktarvy on  discharge. Follow-up with ID clinic.   Latent syphilis ID consulted. Recommendations for 3 weekly doses of IM penicillin   Hypokalemia Treated with potassium supplementation. Resolved.    Consultants: Infectious disease Procedures performed: None  Disposition: Home Diet recommendation: Regular diet   DISCHARGE MEDICATION: Allergies as of 07/24/2021   No Known Allergies      Medication List     STOP taking these medications    benzonatate 100 MG capsule Commonly known as: TESSALON   ondansetron 4 MG disintegrating tablet Commonly known as: Zofran ODT   oseltamivir 75 MG capsule Commonly known as: TAMIFLU       TAKE these medications    bictegravir-emtricitabine-tenofovir AF 50-200-25 MG Tabs tablet Commonly known as: BIKTARVY Take 1 tablet by mouth daily.   fluticasone 50 MCG/ACT nasal spray Commonly known as: FLONASE Place 2 sprays into both nostrils daily.   predniSONE 20 MG tablet Commonly known as: DELTASONE Take 2 tablets (40 mg total) by mouth daily with breakfast for 4 days, THEN 2 tablets (40 mg total) daily with breakfast for 11 days. Start taking on: July 25, 2021   sulfamethoxazole-trimethoprim 800-160 MG tablet Commonly known as: BACTRIM DS Take 2 tablets by mouth 3 (three) times daily for 17 days.               Durable Medical Equipment  (From admission, onward)           Start     Ordered   07/21/21 1430  For home use only DME oxygen  Once       Question Answer Comment  Length of Need 6 Months   Mode or (Route) Nasal cannula   Liters per Minute 4   Frequency Continuous (  stationary and portable oxygen unit needed)   Oxygen conserving device Yes   Oxygen delivery system Gas      07/21/21 1429            Follow-up Information     [facility] Follow up.   Why: Home 02 arranged- portable tank to be delivered to room prior to discharge,  Contact Info: [address] [place] [address] Phone:  [phone]        [facility]. Schedule an appointment as soon as possible for a visit.   Why: Use Main entrance A to hospital and go to grand floor east side for clinic. Contact information: [address] [place] [address] [phone]               Discharge Exam: BP (!) 139/94 (BP Location: Right Arm)   Pulse 89   Temp 97.7 F (36.5 C) (Oral)   Resp 20   Ht 5\' 11"  (1.803 m)   Wt 75.2 kg   SpO2 93%   BMI 23.12 kg/m   General exam: Appears calm and comfortable Respiratory system: Clear to auscultation with some diminished breath sounds at LL base. Respiratory effort normal. Cardiovascular system: S1 & S2 heard, RRR. No murmurs, rubs, gallops or clicks. Gastrointestinal system: Abdomen is nondistended, soft and nontender. Normal bowel sounds heard. Central nervous system: Alert and oriented. No focal neurological deficits. Musculoskeletal: No edema. No calf tenderness Skin: No cyanosis. No rashes Psychiatry: Judgement and insight appear normal. Mood & affect appropriate.   Condition at discharge: stable  The results of significant diagnostics from this hospitalization (including imaging, microbiology, ancillary and laboratory) are listed below for reference.   Imaging Studies: CT Angio Chest PE W/Cm &/Or Wo Cm  Result Date: 07/19/2021 CLINICAL DATA:  Chest tightness and shortness of breath with productive cough, initial encounter EXAM: CT ANGIOGRAPHY CHEST WITH CONTRAST TECHNIQUE: Multidetector CT imaging of the chest was performed using the standard protocol during bolus administration of intravenous contrast. Multiplanar CT image reconstructions and MIPs were obtained to evaluate the vascular anatomy. RADIATION DOSE REDUCTION: This exam was performed according to the departmental dose-optimization program which includes automated exposure control, adjustment of the mA and/or kV according to patient size and/or use of  iterative reconstruction technique. CONTRAST:  54mL OMNIPAQUE IOHEXOL 350 MG/ML SOLN COMPARISON:  Chest x-ray from earlier in the same day. FINDINGS: Cardiovascular: Heart is enlarged in size. Thoracic aorta shows no aneurysmal dilatation or dissection. Pulmonary artery shows a normal branching pattern without definitive filling defect to suggest pulmonary embolism. Opacification in the right lower lobe particularly is somewhat limited due to adjacent infiltrate. Mediastinum/Nodes: Thoracic inlet is within normal limits. No hilar or mediastinal adenopathy is noted. The esophagus is within normal limits. Lungs/Pleura: Lungs are well aerated bilaterally with the exception of the lower lobes with patchy infiltrate right slightly greater than left identified consistent with early infiltrate. Patchy ground-glass opacities are noted throughout the remaining lungs which may represent postinflammatory change is well. These have increased in the interval from the prior exam. Upper Abdomen: Visualized upper abdomen is unremarkable. Musculoskeletal: No acute bony abnormality is noted. Review of the MIP images confirms the above findings. IMPRESSION: No evidence of pulmonary emboli. Bibasilar infiltrates right greater than left consistent with developing pneumonia. Increase in generalized ground-glass opacities throughout both lungs as previously described. Correlation with laboratory values is recommended as COVID pneumonia could present in this fashion. Electronically Signed   By:  Alcide CleverMark  Lukens M.D.   On: 07/19/2021 02:17   DG Chest 2 View  Result Date: 07/19/2021 CLINICAL DATA:  Dyspnea. Shortness of breath and chest tightness. Oxygen requirement. EXAM: CHEST - 2 VIEW COMPARISON:  Radiograph and CT 05/21/2021 FINDINGS: Low lung volumes. Progressive min patchy bibasilar airspace disease from prior imaging. The heart is normal in size with normal mediastinal contours. There is no pleural effusion or pneumothorax. No acute  osseous abnormalities. Incidental bilateral cervical ribs. IMPRESSION: Low lung volumes with progressive patchy bibasilar airspace disease from prior imaging, suspicious for pneumonia. Electronically Signed   By: Narda RutherfordMelanie  Sanford M.D.   On: 07/19/2021 00:02    Microbiology: Results for orders placed or performed during the hospital encounter of 07/18/21  Blood Culture (routine x 2)     Status: None   Collection Time: 07/19/21  1:05 AM   Specimen: BLOOD LEFT HAND  Result Value Ref Range Status   Specimen Description BLOOD LEFT HAND  Final   Special Requests   Final    BOTTLES DRAWN AEROBIC AND ANAEROBIC Blood Culture results may not be optimal due to an inadequate volume of blood received in culture bottles   Culture   Final    NO GROWTH 5 DAYS Performed at [facility], [address] [place], [place] [address]    Report Status 07/24/2021 FINAL  Final  Blood Culture (routine x 2)     Status: None   Collection Time: 07/19/21  1:10 AM   Specimen: BLOOD  Result Value Ref Range Status   Specimen Description BLOOD RIGHT ANTECUBITAL  Final   Special Requests   Final    BOTTLES DRAWN AEROBIC AND ANAEROBIC Blood Culture results may not be optimal due to an inadequate volume of blood received in culture bottles   Culture   Final    NO GROWTH 5 DAYS Performed at [facility], [address] [place], [place] [address]    Report Status 07/24/2021 FINAL  Final  Resp Panel by RT-PCR (Flu A&B, Covid) Anterior Nasal Swab     Status: None   Collection Time: 07/19/21  3:15 AM   Specimen: Anterior Nasal Swab  Result Value Ref Range Status   SARS Coronavirus 2 by RT PCR NEGATIVE NEGATIVE Final    Comment: (NOTE) SARS-CoV-2 target nucleic acids are NOT DETECTED.  The SARS-CoV-2 RNA is generally detectable in upper respiratory specimens during the acute phase of infection. The lowest concentration of SARS-CoV-2 viral copies this assay can detect is 138 copies/mL. A negative result does  not preclude SARS-Cov-2 infection and should not be used as the sole basis for treatment or other patient management decisions. A negative result may occur with  improper specimen collection/handling, submission of specimen other than nasopharyngeal swab, presence of viral mutation(s) within the areas targeted by this assay, and inadequate number of viral copies(<138 copies/mL). A negative result must be combined with clinical observations, patient history, and epidemiological information. The expected result is Negative.  Fact Sheet for Patients:  BloggerCourse.comhttps://www.fda.gov/media/152166/download  Fact Sheet for Healthcare Providers:  SeriousBroker.ithttps://www.fda.gov/media/152162/download  This test is no t yet approved or cleared by the [place] FDA and  has been authorized for detection and/or diagnosis of SARS-CoV-2 by FDA under an Emergency Use Authorization (EUA). This EUA will remain  in effect (meaning this test can be used) for the duration of the COVID-19 declaration under Section 564(b)(1) of the Act, 21 U.S.C.section 360bbb-3(b)(1), unless the authorization is terminated  or revoked sooner.  Influenza A by PCR NEGATIVE NEGATIVE Final   Influenza B by PCR NEGATIVE NEGATIVE Final    Comment: (NOTE) The Xpert Xpress SARS-CoV-2/FLU/RSV plus assay is intended as an aid in the diagnosis of influenza from Nasopharyngeal swab specimens and should not be used as a sole basis for treatment. Nasal washings and aspirates are unacceptable for Xpert Xpress SARS-CoV-2/FLU/RSV testing.  Fact Sheet for Patients: BloggerCourse.com  Fact Sheet for Healthcare Providers: SeriousBroker.it  This test is not yet approved or cleared by the [place] FDA and has been authorized for detection and/or diagnosis of SARS-CoV-2 by FDA under an Emergency Use Authorization (EUA). This EUA will remain in effect (meaning this test can be used) for the  duration of the COVID-19 declaration under Section 564(b)(1) of the Act, 21 U.S.C. section 360bbb-3(b)(1), unless the authorization is terminated or revoked.  Performed at [facility], [address], [place], [place] [address]   Expectorated Sputum Assessment w Gram Stain, Rflx to Resp Cult     Status: None   Collection Time: 07/19/21  6:01 AM   Specimen: Expectorated Sputum  Result Value Ref Range Status   Specimen Description EXPSU  Final   Special Requests NONE  Final   Sputum evaluation   Final    THIS SPECIMEN IS ACCEPTABLE FOR SPUTUM CULTURE Performed at [facility], [address] [place], [place] [address]    Report Status 07/20/2021 FINAL  Final  Culture, Respiratory w Gram Stain     Status: None   Collection Time: 07/19/21  6:01 AM  Result Value Ref Range Status   Specimen Description EXPECTORATED SPUTUM  Final   Special Requests NONE Reflexed from [id number]  Final   Gram Stain   Final    RARE WBC PRESENT,BOTH PMN AND MONONUCLEAR FEW GRAM POSITIVE COCCI RARE GRAM NEGATIVE RODS    Culture   Final    Normal respiratory flora-no Staph aureus or Pseudomonas seen Performed at [facility], [address] [place], [place] [address]    Report Status 07/23/2021 FINAL  Final  Respiratory (~20 pathogens) panel by PCR     Status: None   Collection Time: 07/19/21  9:46 PM   Specimen: Nasopharyngeal Swab; Respiratory  Result Value Ref Range Status   Adenovirus NOT DETECTED NOT DETECTED Final   Coronavirus 229E NOT DETECTED NOT DETECTED Final    Comment: (NOTE) The Coronavirus on the Respiratory Panel, DOES NOT test for the novel  Coronavirus (2019 nCoV)    Coronavirus HKU1 NOT DETECTED NOT DETECTED Final   Coronavirus NL63 NOT DETECTED NOT DETECTED Final   Coronavirus OC43 NOT DETECTED NOT DETECTED Final   Metapneumovirus NOT DETECTED NOT DETECTED Final   Rhinovirus / Enterovirus NOT DETECTED NOT DETECTED Final   Influenza A NOT DETECTED NOT DETECTED  Final   Influenza B NOT DETECTED NOT DETECTED Final   Parainfluenza Virus 1 NOT DETECTED NOT DETECTED Final   Parainfluenza Virus 2 NOT DETECTED NOT DETECTED Final   Parainfluenza Virus 3 NOT DETECTED NOT DETECTED Final   Parainfluenza Virus 4 NOT DETECTED NOT DETECTED Final   Respiratory Syncytial Virus NOT DETECTED NOT DETECTED Final   Bordetella pertussis NOT DETECTED NOT DETECTED Final   Bordetella Parapertussis NOT DETECTED NOT DETECTED Final   Chlamydophila pneumoniae NOT DETECTED NOT DETECTED Final   Mycoplasma pneumoniae NOT DETECTED NOT DETECTED Final    Comment: Performed at [facility], [address] [place], [place] [address]    Labs:  CBC: Recent Labs  Lab 07/18/21 2335 07/19/21 1102 07/20/21 0104  WBC 9.4  --  4.8  NEUTROABS 7.4  --  3.9  HGB 13.8 12.9* 13.0  HCT 43.0 38.0* 39.2  MCV 93.9  --  92.0  PLT 261  --  235   Basic Metabolic Panel: Recent Labs  Lab 07/18/21 2335 07/19/21 1102 07/20/21 0104 07/24/21 0559  NA 134* 143 138 135  K 3.3* 3.8 4.4 4.4  CL 100  --  106 103  CO2 22  --  22 22  GLUCOSE 110*  --  140* 111*  BUN 6  --  <5* 15  CREATININE 1.11  --  0.92 1.10  CALCIUM 9.0  --  9.0 8.9  MG  --   --  2.1  --    Liver Function Tests: Recent Labs  Lab 07/19/21 0036 07/20/21 0104  AST 34 36  ALT 16 14  ALKPHOS 93 91  BILITOT 0.7 0.4  PROT 7.9 7.3  ALBUMIN 3.8 3.4*    Discharge time spent: 35 minutes.  Signed: Jacquelin Hawking, MD [facility] 07/24/2021

## 2021-07-25 LAB — QUANTIFERON-TB GOLD PLUS: QuantiFERON-TB Gold Plus: NEGATIVE

## 2021-07-25 LAB — QUANTIFERON-TB GOLD PLUS (RQFGPL)
QuantiFERON Mitogen Value: 0.62 IU/mL
QuantiFERON Nil Value: 0 IU/mL
QuantiFERON TB1 Ag Value: 0 IU/mL
QuantiFERON TB2 Ag Value: 0 IU/mL

## 2021-07-26 LAB — HLA B*5701: HLA B 5701: NEGATIVE

## 2021-07-27 ENCOUNTER — Other Ambulatory Visit (HOSPITAL_COMMUNITY): Payer: Self-pay

## 2021-07-28 ENCOUNTER — Telehealth: Payer: Self-pay

## 2021-07-28 ENCOUNTER — Ambulatory Visit (INDEPENDENT_AMBULATORY_CARE_PROVIDER_SITE_OTHER): Payer: Managed Care, Other (non HMO) | Admitting: Pharmacist

## 2021-07-28 ENCOUNTER — Ambulatory Visit: Payer: Managed Care, Other (non HMO)

## 2021-07-28 ENCOUNTER — Other Ambulatory Visit: Payer: Self-pay

## 2021-07-28 DIAGNOSIS — A530 Latent syphilis, unspecified as early or late: Secondary | ICD-10-CM | POA: Diagnosis not present

## 2021-07-28 MED ORDER — PENICILLIN G BENZATHINE 1200000 UNIT/2ML IM SUSY
2.4000 10*6.[IU] | PREFILLED_SYRINGE | Freq: Once | INTRAMUSCULAR | Status: AC
  Administered 2021-07-28: 2.4 10*6.[IU] via INTRAMUSCULAR

## 2021-07-28 NOTE — Progress Notes (Signed)
HPI: Stephen Lee is a 31 y.o. male who presents to the [facility] pharmacy clinic for HIV follow-up and syphilis treatment.  Patient Active Problem List   Diagnosis Date Noted   AIDS (HCC) 07/22/2021   Acute respiratory failure with hypoxia (HCC) 07/19/2021   Hypokalemia 07/19/2021   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   HIV (human immunodeficiency virus infection) (HCC) 07/19/2021   Condyloma acuminatum in male 10/12/2010    Patient's Medications  New Prescriptions   No medications on file  Previous Medications   BICTEGRAVIR-EMTRICITABINE-TENOFOVIR AF (BIKTARVY) 50-200-25 MG TABS TABLET    Take 1 tablet by mouth daily.   FLUTICASONE (FLONASE) 50 MCG/ACT NASAL SPRAY    Place 2 sprays into both nostrils daily.   PREDNISONE (DELTASONE) 20 MG TABLET    Take 2 tablets (40 mg total) by mouth daily with breakfast for 4 days, THEN 2 tablets (40 mg total) daily with breakfast for 11 days.   SULFAMETHOXAZOLE-TRIMETHOPRIM (BACTRIM DS) 800-160 MG TABLET    Take 2 tablets by mouth 3 (three) times daily for 17 days.  Modified Medications   No medications on file  Discontinued Medications   No medications on file    Allergies: No Known Allergies  Past Medical History: Past Medical History:  Diagnosis Date   Attention deficit    Condyloma acuminatum in male    perianal & anal canal   Depression     Social History: Social History   Socioeconomic History   Marital status: Single    Spouse name: Not on file   Number of children: Not on file   Years of education: Not on file   Highest education level: Not on file  Occupational History   Not on file  Tobacco Use   Smoking status: Never   Smokeless tobacco: Never  Vaping Use   Vaping Use: Never used  Substance and Sexual Activity   Alcohol use: Yes   Drug use: No   Sexual activity: Yes    Birth control/protection: None  Other Topics Concern   Not on file  Social History Narrative   Not on file   Social  Determinants of Health   Financial Resource Strain: Not on file  Food Insecurity: Not on file  Transportation Needs: Not on file  Physical Activity: Not on file  Stress: Not on file  Social Connections: Not on file    Labs: Lab Results  Component Value Date   HIV1RNAQUANT 580,000 07/19/2021   CD4TABS 115 (L) 07/19/2021    RPR and STI Lab Results  Component Value Date   LABRPR Reactive (A) 07/21/2021   LABRPR NON REACTIVE 10/31/2010    STI Results GC CT  07/20/2021  3:15 PM Negative  Negative   07/08/2014 12:00 AM **POSITIVE**  Negative   05/24/2011 12:29 PM  NEGATIVE     Hepatitis B Lab Results  Component Value Date   HEPBSAG NON REACTIVE 07/21/2021   Hepatitis C No results found for: "HEPCAB", "HCVRNAPCRQN" Hepatitis A Lab Results  Component Value Date   HAV NON REACTIVE 07/21/2021   Lipids: Lab Results  Component Value Date   CHOL 192 07/21/2021   TRIG 78 07/21/2021   HDL 38 (L) 07/21/2021   CHOLHDL 5.1 07/21/2021   VLDL 16 07/21/2021   LDLCALC 138 (H) 07/21/2021    Current HIV Regimen: Biktarvy  Assessment: Stephen Lee presents to the pharmacy clinic today for late latent syphilis treatment today along with HIV follow-up. His RPR titer was 1:8 after unknown RPR  titer history; he denied any treatment within the past year. Will receive Bicillin 2.4 million units x 3 weekly doses for late latent syphilis. He received Bicillin 2.4 million units x 1 on 07/21/21 while admitted in the hospital. Administered second Bicillin today; will receive third and final Bicillin next week. He tolerated the injection well and denies any adverse events.  He has been recently diagnosed with HIV; initial viral load at 580,000 with genotype pending. CD4 count relatively low at 115; diagnosed with PCP. Has been taking Bactrim 2 DS tablets TID without issue. Takes Biktarvy every morning with first dose of Bactrim and prednisone taper. Denies any adherence problems or adverse effects  with Bactrim and Biktarvy. Reviewed change in Bactrim dosing after taking 3 weeks of treatment dose. States the prednisone has significantly increased his appetite and energy levels this week. Appreciates the increased appetite at this time to help with increasing his weight. States overall his breathing has significantly improved. Will follow-up with [name] in July for HIV care. No need for labs today given recently checked on 6/12 and can be checked with [name] in a few weeks; kidney function and potassium within normal limits while admitted.  Plan: Administer Bicillin 2.4 million units x 1 Scheduled for last Bicillin injection on 6/23 Continue Biktarvy, Bactrim, and prednisone taper Follow-up with [name] on 7/10  Margarite Gouge, PharmD, CPP Clinical Pharmacist Practitioner Infectious Diseases Clinical Pharmacist [facility] 07/28/2021, 10:47 AM

## 2021-07-28 NOTE — Telephone Encounter (Signed)
Received STD forms from [employer], original placed in provider's box - copy in triage.   Patient has appointment with Marcos Eke, NP 08/21/21.  Sandie Ano, RN

## 2021-07-31 NOTE — Telephone Encounter (Signed)
Called patient regarding forms. Informed him that [place], FNP was able to complete forms and would need patient signature before it could be faxed. Patient will be in office this AM to complete form and would like for office to fax once completed. Juanita Laster, RMA

## 2021-08-04 ENCOUNTER — Ambulatory Visit (INDEPENDENT_AMBULATORY_CARE_PROVIDER_SITE_OTHER): Payer: Managed Care, Other (non HMO)

## 2021-08-04 ENCOUNTER — Other Ambulatory Visit: Payer: Self-pay

## 2021-08-04 DIAGNOSIS — A530 Latent syphilis, unspecified as early or late: Secondary | ICD-10-CM | POA: Diagnosis not present

## 2021-08-04 MED ORDER — PENICILLIN G BENZATHINE 1200000 UNIT/2ML IM SUSY
2.4000 10*6.[IU] | PREFILLED_SYRINGE | Freq: Once | INTRAMUSCULAR | Status: AC
  Administered 2021-08-04: 2.4 10*6.[IU] via INTRAMUSCULAR

## 2021-08-04 NOTE — Progress Notes (Signed)
Patient in office today for 2.4 MU of Bicillin dose 3 of 3. Patient tolerated well and no complaints. Skarlett Sedlacek T Pricilla Loveless

## 2021-08-08 LAB — HIV GENOSURE(R) MG

## 2021-08-08 LAB — REFLEX TO GENOSURE(R) MG EDI: HIV GenoSure(R): 1

## 2021-08-08 LAB — HIV-1 RNA, PCR (GRAPH) RFX/GENO EDI
HIV-1 RNA BY PCR: 574000 copies/mL
HIV-1 RNA Quant, Log: 5.759 log10copy/mL

## 2021-08-10 ENCOUNTER — Ambulatory Visit: Payer: Managed Care, Other (non HMO) | Admitting: Family

## 2021-08-14 ENCOUNTER — Encounter: Payer: Self-pay | Admitting: Family

## 2021-08-21 ENCOUNTER — Other Ambulatory Visit (HOSPITAL_COMMUNITY): Payer: Self-pay

## 2021-08-21 ENCOUNTER — Other Ambulatory Visit: Payer: Self-pay

## 2021-08-21 ENCOUNTER — Encounter: Payer: Self-pay | Admitting: Family

## 2021-08-21 ENCOUNTER — Ambulatory Visit (INDEPENDENT_AMBULATORY_CARE_PROVIDER_SITE_OTHER): Payer: Managed Care, Other (non HMO) | Admitting: Family

## 2021-08-21 VITALS — BP 136/96 | HR 88 | Temp 98.3°F | Wt 175.0 lb

## 2021-08-21 DIAGNOSIS — B59 Pneumocystosis: Secondary | ICD-10-CM

## 2021-08-21 DIAGNOSIS — A539 Syphilis, unspecified: Secondary | ICD-10-CM | POA: Diagnosis not present

## 2021-08-21 DIAGNOSIS — Z0000 Encounter for general adult medical examination without abnormal findings: Secondary | ICD-10-CM

## 2021-08-21 DIAGNOSIS — Z5181 Encounter for therapeutic drug level monitoring: Secondary | ICD-10-CM

## 2021-08-21 DIAGNOSIS — B20 Human immunodeficiency virus [HIV] disease: Secondary | ICD-10-CM | POA: Diagnosis not present

## 2021-08-21 DIAGNOSIS — Z23 Encounter for immunization: Secondary | ICD-10-CM

## 2021-08-21 MED ORDER — SULFAMETHOXAZOLE-TRIMETHOPRIM 800-160 MG PO TABS
1.0000 | ORAL_TABLET | Freq: Every day | ORAL | 3 refills | Status: DC
Start: 2021-08-21 — End: 2021-09-22

## 2021-08-21 MED ORDER — PREDNISONE 20 MG PO TABS: 20.0000 mg | ORAL_TABLET | Freq: Every day | ORAL | 0 refills | Status: DC

## 2021-08-21 NOTE — Addendum Note (Signed)
Addended by: Valarie Cones on: 08/21/2021 04:46 PM   Modules accepted: Orders

## 2021-08-21 NOTE — Assessment & Plan Note (Signed)
Stephen Lee has completed treatment for late latent syphilis with 3 weekly injections of 2.4 million units of Bicillin. Initial titer of 1:8.  No current symptoms. Recheck RPR at next office visit.

## 2021-08-21 NOTE — Assessment & Plan Note (Signed)
Renal function stable with no evidence of renal toxicity. Check renal function for monitoring while on Bactrim and Biktarvy.

## 2021-08-21 NOTE — Assessment & Plan Note (Signed)
[name] is a 31 y/o AA male with HIV disease diagnosed on 07/19/21 with risk factor of MSM. Initial viral load was 580,000 with CD4 count of 115. Initial Genotype with novel mutations V35V/I, V60I, R83K, Q102K, D123E, C162S, V179D, G196E, T200I, R211K, V245Q, A272S, R277R/K, V292I conferring potential low level resistance to NNRTI's efavirenz, etravirine, nevirapine, and rilpivirine. Entered care at [facility] Stage 3 with newly diagnosed PCP pneumonia. Was co-infected with syphilis at the time of diagnosis (titer 1:8). Treatment naive. Started on [place] on entry to care.  Tolerating Biktarvy and Bactrim. Reviewed previous lab work results and will check labs today. Discussed U=U and plan of care. Continue current dose of Biktarvy and Bactrim. Plan for follow up in 1 month or sooner if needed.

## 2021-08-21 NOTE — Progress Notes (Signed)
Brief Narrative   Patient ID: Stephen Lee, male    DOB: 12/29/1990, 31 y.o.   MRN: [id number]  Mr. Chery is a 31 y/o AA male with HIV disease diagnosed on 07/19/21 with risk factor of MSM. Initial viral load was 580,000 with CD4 count of 115. Initial Genotype with novel mutations V35V/I, V60I, R83K, Q102K, D123E, C162S, V179D, G196E, T200I, R211K, V245Q, A272S, R277R/K, V292I conferring potential low level resistance to NNRTI's efavirenz, etravirine, nevirapine, and rilpivirine. 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 Was co-infected with syphilis at the time of diagnosis (titer 1:8). Treatment naive. Started on [place] on entry to care.   Subjective:    Chief Complaint  Patient presents with   HIV Positive/AIDS    HPI:  Stephen Lee is a 31 y.o. male with previous medical history with ADHD and depression and recent hospitalization for PCP pneumonia and new diagnosis of HIV disease presenting today for initial clinic visit.   Mr. Nunnery was recently admitted to [facility] from 07/19/21-07/24/21 for chest tightness, cough and shortness of breath and found to have PCP pneumonia in the setting of newly diagnosed AIDS with initial viral load of 580,000 and CD4 count of 115. [id number] was negative. Not immune and no infection with Hepatitis A, Hepatitis B, or Hepatitis C.  Quantiferon Gold was negative. Co-infection with syphilis with titer of 1:8. Treated with Bactrim and prednisone taper. Started on ART with Biktarvy. Syphilis was treated with 3 weekly injections of 2.4 million units of Bicillin (07/21/21, 07/28/21, and 08/04/21).   Mr. Melchior has been taking his Biktarvy daily as prescribed as well as the Bactrim. Completed the prednisone about 1.5 weeks ago and still has some congestion but is feeling much better. Has returned to work. No adverse side effects of medication and ordered refill today. Denies fevers, chills, night sweats, headaches, changes in vision, neck  pain/stiffness, nausea, diarrhea, vomiting, lesions or rashes.  Mr. Dollar has Rosann Auerbach and no problems getting medication from the pharmacy. Denies feelings of being down, depressed or hopeless recently. Drinks alcohol on occasion with no recreational/illicit drug use or tobacco use. Condoms offered. Healthcare maintenance due includes pneumococcal and meningococcal vaccinations.   No Known Allergies    Outpatient Medications Prior to Visit  Medication Sig Dispense Refill   bictegravir-emtricitabine-tenofovir AF (BIKTARVY) 50-200-25 MG TABS tablet Take 1 tablet by mouth daily. 30 tablet 2   predniSONE (DELTASONE) 20 MG tablet Take 20 mg by mouth daily with breakfast.     sulfamethoxazole-trimethoprim (BACTRIM DS) 800-160 MG tablet Take 1 tablet by mouth daily.     fluticasone (FLONASE) 50 MCG/ACT nasal spray Place 2 sprays into both nostrils daily. (Patient not taking: Reported on 07/19/2021) 16 g 0   No facility-administered medications prior to visit.     Past Medical History:  Diagnosis Date   Attention deficit    Condyloma acuminatum in male    perianal & anal canal   Depression      History reviewed. No pertinent surgical history.    Review of Systems  Constitutional:  Negative for appetite change, chills, fatigue, fever and unexpected weight change.  HENT:  Positive for congestion.   Eyes:  Negative for visual disturbance.  Respiratory:  Negative for cough, chest tightness, shortness of breath and wheezing.   Cardiovascular:  Negative for chest pain and leg swelling.  Gastrointestinal:  Negative for abdominal pain, constipation, diarrhea, nausea and vomiting.  Genitourinary:  Negative for dysuria, flank pain,  frequency, genital sores, hematuria and urgency.  Skin:  Negative for rash.  Allergic/Immunologic: Negative for immunocompromised state.  Neurological:  Negative for dizziness and headaches.      Objective:    BP (!) 136/96   Pulse 88   Temp 98.3 F (36.8 C)  (Oral)   Wt 175 lb (79.4 kg)   BMI 24.41 kg/m  Nursing note and vital signs reviewed.  Physical Exam Constitutional:      General: He is not in acute distress.    Appearance: He is well-developed.  Eyes:     Conjunctiva/sclera: Conjunctivae normal.  Cardiovascular:     Rate and Rhythm: Normal rate and regular rhythm.     Heart sounds: Normal heart sounds. No murmur heard.    No friction rub. No gallop.  Pulmonary:     Effort: Pulmonary effort is normal. No respiratory distress.     Breath sounds: Normal breath sounds. No wheezing or rales.  Chest:     Chest wall: No tenderness.  Abdominal:     General: Bowel sounds are normal.     Palpations: Abdomen is soft.     Tenderness: There is no abdominal tenderness.  Musculoskeletal:     Cervical back: Neck supple.  Lymphadenopathy:     Cervical: No cervical adenopathy.  Skin:    General: Skin is warm and dry.     Findings: No rash.  Neurological:     Mental Status: He is alert and oriented to person, place, and time.  Psychiatric:        Behavior: Behavior normal.        Thought Content: Thought content normal.        Judgment: Judgment normal.         06/06/2021    1:36 PM  Depression screen PHQ 2/9  Decreased Interest 0  Down, Depressed, Hopeless 0  PHQ - 2 Score 0  Altered sleeping 0  Tired, decreased energy 0  Change in appetite 2  Feeling bad or failure about yourself  0  Trouble concentrating 0  Moving slowly or fidgety/restless 1  Suicidal thoughts 0  PHQ-9 Score 3       Assessment & Plan:    Patient Active Problem List   Diagnosis Date Noted   Syphilis 08/21/2021   Healthcare maintenance 08/21/2021   Therapeutic drug monitoring 08/21/2021   AIDS ([place]) 07/22/2021   Acute respiratory failure with hypoxia ([place]) 07/19/2021   Hypokalemia 07/19/2021   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 Scheidt is a 31 y/o AA male with HIV disease diagnosed on 07/19/21 with risk factor of MSM. Initial viral load was 580,000 with CD4 count of 115. Initial Genotype with novel mutations V35V/I, V60I, R83K, Q102K, D123E, C162S, V179D, G196E, T200I, R211K, V245Q, A272S, R277R/K, V292I conferring potential low level resistance to NNRTI's efavirenz, etravirine, nevirapine, and rilpivirine. Entered care at [facility] Stage 3 with newly diagnosed PCP pneumonia. Was co-infected with syphilis at the time of diagnosis (titer 1:8). Treatment naive. Started on [place] on entry to care.  Tolerating Biktarvy and Bactrim. Reviewed previous lab work results and will check labs today.  Discussed U=U and plan of care. Continue current dose of Biktarvy and Bactrim. Plan for follow up in 1 month or sooner if needed.      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 Initial titer of 1:8.  No current symptoms. Recheck RPR at next office visit.       Relevant Medications   sulfamethoxazole-trimethoprim (BACTRIM DS) 800-160 MG tablet   Healthcare maintenance    Discussed importance of safe sexual practice and family planning. Condoms offered.  Prevnar20 updated.  Encouraged routine dental care and can refer to [facility] clinic if needed.       Therapeutic drug monitoring    Renal function stable with no evidence of renal toxicity. Check renal function for monitoring while on Bactrim and  Biktarvy.       Other Visit Diagnoses     Need for pneumococcal 20-valent conjugate vaccination       Relevant Orders   Pneumococcal conjugate vaccine 20-valent (Prevnar-20) (Completed)        I have discontinued Shayde Lieder's fluticasone. I have also changed his predniSONE. Additionally, I am having him maintain his bictegravir-emtricitabine-tenofovir AF and sulfamethoxazole-trimethoprim.   Meds ordered this encounter  Medications   predniSONE (DELTASONE) 20 MG tablet    Sig: Take 1 tablet (20 mg total) by mouth daily with breakfast.    Dispense:  3 tablet    Refill:  0    Order Specific Question:   Supervising Provider    Answer:   Drue Second, CYNTHIA [[id number]]   sulfamethoxazole-trimethoprim (BACTRIM DS) 800-160 MG tablet    Sig: Take 1 tablet by mouth daily.    Dispense:  30 tablet    Refill:  3    Order Specific Question:   Supervising Provider    Answer:   Judyann Munson [[id number]]     Follow-up: Return in about 1 month (around 09/21/2021), or if symptoms worsen or fail to improve.   Marcos Eke, MSN, FNP-C Nurse Practitioner [facility] Main number: [phone]

## 2021-08-21 NOTE — Assessment & Plan Note (Signed)
   Discussed importance of safe sexual practice and family planning. Condoms offered.   Prevnar20 updated.   Encouraged routine dental care and can refer to [facility] clinic if needed.

## 2021-08-21 NOTE — Patient Instructions (Addendum)
Nice to see you.  We will check your lab work today.  Continue to take your medication daily as prescribed.  Refills have been sent to the pharmacy.  Plan for follow up in 1 months or sooner if needed with lab work on the same day.  Have a great day and stay safe!  

## 2021-08-22 ENCOUNTER — Other Ambulatory Visit (HOSPITAL_COMMUNITY): Payer: Self-pay

## 2021-08-22 LAB — T-HELPER CELL (CD4) - (RCID CLINIC ONLY)
CD4 % Helper T Cell: 16 % — ABNORMAL LOW (ref 33–65)
CD4 T Cell Abs: 477 /uL (ref 400–1790)

## 2021-08-23 LAB — COMPREHENSIVE METABOLIC PANEL
AG Ratio: 1.7 (calc) (ref 1.0–2.5)
ALT: 15 U/L (ref 9–46)
AST: 28 U/L (ref 10–40)
Albumin: 4.6 g/dL (ref 3.6–5.1)
Alkaline phosphatase (APISO): 93 U/L (ref 36–130)
BUN/Creatinine Ratio: 9 (calc) (ref 6–22)
BUN: 12 mg/dL (ref 7–25)
CO2: 26 mmol/L (ref 20–32)
Calcium: 9.8 mg/dL (ref 8.6–10.3)
Chloride: 105 mmol/L (ref 98–110)
Creat: 1.3 mg/dL — ABNORMAL HIGH (ref 0.60–1.26)
Globulin: 2.7 g/dL (calc) (ref 1.9–3.7)
Glucose, Bld: 91 mg/dL (ref 65–99)
Potassium: 3.9 mmol/L (ref 3.5–5.3)
Sodium: 143 mmol/L (ref 135–146)
Total Bilirubin: 0.3 mg/dL (ref 0.2–1.2)
Total Protein: 7.3 g/dL (ref 6.1–8.1)

## 2021-08-23 LAB — HIV-1 RNA QUANT-NO REFLEX-BLD
HIV 1 RNA Quant: 163 Copies/mL — ABNORMAL HIGH
HIV-1 RNA Quant, Log: 2.21 Log cps/mL — ABNORMAL HIGH

## 2021-09-12 ENCOUNTER — Other Ambulatory Visit (HOSPITAL_COMMUNITY): Payer: Self-pay

## 2021-09-14 ENCOUNTER — Other Ambulatory Visit (HOSPITAL_COMMUNITY): Payer: Self-pay

## 2021-09-22 ENCOUNTER — Other Ambulatory Visit (HOSPITAL_COMMUNITY): Payer: Self-pay

## 2021-09-22 ENCOUNTER — Ambulatory Visit (INDEPENDENT_AMBULATORY_CARE_PROVIDER_SITE_OTHER): Payer: Managed Care, Other (non HMO) | Admitting: Family

## 2021-09-22 ENCOUNTER — Other Ambulatory Visit: Payer: Self-pay

## 2021-09-22 ENCOUNTER — Encounter: Payer: Self-pay | Admitting: Family

## 2021-09-22 VITALS — BP 144/91 | HR 108 | Temp 98.2°F | Wt 181.0 lb

## 2021-09-22 DIAGNOSIS — Z0000 Encounter for general adult medical examination without abnormal findings: Secondary | ICD-10-CM | POA: Diagnosis not present

## 2021-09-22 DIAGNOSIS — B20 Human immunodeficiency virus [HIV] disease: Secondary | ICD-10-CM

## 2021-09-22 MED ORDER — BICTEGRAVIR-EMTRICITAB-TENOFOV 50-200-25 MG PO TABS
1.0000 | ORAL_TABLET | Freq: Every day | ORAL | 3 refills | Status: DC
Start: 2021-09-22 — End: 2022-02-16
  Filled 2021-09-22 – 2021-10-17 (×2): qty 30, 30d supply, fill #0
  Filled 2021-11-08: qty 30, 30d supply, fill #1
  Filled 2021-12-18: qty 30, 30d supply, fill #2
  Filled 2022-01-11: qty 30, 30d supply, fill #3

## 2021-09-22 MED ORDER — SULFAMETHOXAZOLE-TRIMETHOPRIM 800-160 MG PO TABS
1.0000 | ORAL_TABLET | Freq: Every day | ORAL | 2 refills | Status: DC
  Filled 2021-09-22 – 2021-10-17 (×3): qty 30, 30d supply, fill #0
  Filled 2021-11-08: qty 30, 30d supply, fill #1
  Filled 2021-12-18: qty 30, 30d supply, fill #2

## 2021-09-22 NOTE — Progress Notes (Signed)
Brief Narrative   Patient ID: Stephen Lee, male    DOB: 01-26-1991, 31 y.o.   MRN: [id number]  Stephen Lee is a 31 y/o AA male with HIV disease diagnosed on 07/19/21 with risk factor of MSM. Initial viral load was 580,000 with CD4 count of 115. Initial Genotype with novel mutations V35V/I, V60I, R83K, Q102K, D123E, C162S, V179D, G196E, T200I, R211K, V245Q, A272S, R277R/K, V292I conferring potential low level resistance to NNRTI's efavirenz, etravirine, nevirapine, and rilpivirine. 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 Was co-infected with syphilis at the time of diagnosis (titer 1:8). Treatment naive. Started on [place] on entry to care.   Subjective:    Chief Complaint  Patient presents with   Follow-up    HPI:  Stephen Lee is a 31 y.o. male with HIV/AIDS last seen on 08/21/2021 good adherence and tolerance to his ART regimen of Biktarvy supplemented with Bactrim for OI prophylaxis.  Viral load had improved to 163 with CD4 count of 477.  Kidney function, liver function, electrolytes within normal ranges.  Here today for routine follow-up.  Stephen Lee has been taking his Biktarvy and Bactrim daily as prescribed with no adverse side effects.  Feeling well today with no new concerns/complaints. Denies fevers, chills, night sweats, headaches, changes in vision, neck pain/stiffness, nausea, diarrhea, vomiting, lesions or rashes.  No problems obtaining medication from the pharmacy.  Condoms offered.  No Known Allergies    Outpatient Medications Prior to Visit  Medication Sig Dispense Refill   bictegravir-emtricitabine-tenofovir AF (BIKTARVY) 50-200-25 MG TABS tablet Take 1 tablet by mouth daily. 30 tablet 2   predniSONE (DELTASONE) 20 MG tablet Take 1 tablet (20 mg total) by mouth daily with breakfast. (Patient not taking: Reported on 09/22/2021) 3 tablet 0   sulfamethoxazole-trimethoprim (BACTRIM DS) 800-160 MG tablet Take 1 tablet by mouth daily. (Patient not taking:  Reported on 09/22/2021) 30 tablet 3   No facility-administered medications prior to visit.     Past Medical History:  Diagnosis Date   Attention deficit    Condyloma acuminatum in male    perianal & anal canal   Depression      History reviewed. No pertinent surgical history.    Review of Systems  Constitutional:  Negative for appetite change, chills, fatigue, fever and unexpected weight change.  Eyes:  Negative for visual disturbance.  Respiratory:  Negative for cough, chest tightness, shortness of breath and wheezing.   Cardiovascular:  Negative for chest pain and leg swelling.  Gastrointestinal:  Negative for abdominal pain, constipation, diarrhea, nausea and vomiting.  Genitourinary:  Negative for dysuria, flank pain, frequency, genital sores, hematuria and urgency.  Skin:  Negative for rash.  Allergic/Immunologic: Negative for immunocompromised state.  Neurological:  Negative for dizziness and headaches.      Objective:    BP (!) 144/91   Pulse (!) 108   Temp 98.2 F (36.8 C) (Oral)   Wt 181 lb (82.1 kg)   BMI 25.24 kg/m  Nursing note and vital signs reviewed.  Physical Exam Constitutional:      General: He is not in acute distress.    Appearance: He is well-developed.  Eyes:     Conjunctiva/sclera: Conjunctivae normal.  Cardiovascular:     Rate and Rhythm: Normal rate and regular rhythm.     Heart sounds: Normal heart sounds. No murmur heard.    No friction rub. No gallop.  Pulmonary:     Effort: Pulmonary effort is normal. No respiratory  distress.     Breath sounds: Normal breath sounds. No wheezing or rales.  Chest:     Chest wall: No tenderness.  Abdominal:     General: Bowel sounds are normal.     Palpations: Abdomen is soft.     Tenderness: There is no abdominal tenderness.  Musculoskeletal:     Cervical back: Neck supple.  Lymphadenopathy:     Cervical: No cervical adenopathy.  Skin:    General: Skin is warm and dry.     Findings: No rash.   Neurological:     Mental Status: He is alert and oriented to person, place, and time.  Psychiatric:        Behavior: Behavior normal.        Thought Content: Thought content normal.        Judgment: Judgment normal.         09/22/2021   11:20 AM 08/21/2021    4:40 PM 06/06/2021    1:36 PM  Depression screen PHQ 2/9  Decreased Interest 0 0 0  Down, Depressed, Hopeless 0 0 0  PHQ - 2 Score 0 0 0  Altered sleeping   0  Tired, decreased energy   0  Change in appetite   2  Feeling bad or failure about yourself    0  Trouble concentrating   0  Moving slowly or fidgety/restless   1  Suicidal thoughts   0  PHQ-9 Score   3       Assessment & Plan:    Patient Active Problem List   Diagnosis Date Noted   Syphilis 08/21/2021   Healthcare maintenance 08/21/2021   Therapeutic drug monitoring 08/21/2021   AIDS ([place]) 07/22/2021   Acute respiratory failure with hypoxia (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  CD4 count most recently of 477 and discussed possibility of stopping Bactrim in 2 months.  Check lab work today.  Continue current dose of Biktarvy and Bactrim.  Plan for follow-up in 2 months or sooner if needed with lab work on the same day.      Relevant Medications   bictegravir-emtricitabine-tenofovir AF (BIKTARVY) 50-200-25 MG TABS tablet   sulfamethoxazole-trimethoprim (BACTRIM DS) 800-160 MG tablet   Other Relevant Orders   T-helper cells (CD4) count (not at [facility])   HIV-1 RNA quant-no reflex-bld   Healthcare maintenance    Discussed importance of safe sexual practices and condom use.  Condoms offered. Encouraged to complete  routine dental care independently        I have discontinued Stephen Lee's predniSONE. I am also having him maintain his bictegravir-emtricitabine-tenofovir AF and sulfamethoxazole-trimethoprim.   Meds ordered this encounter  Medications   bictegravir-emtricitabine-tenofovir AF (BIKTARVY) 50-200-25 MG TABS tablet    Sig: Take 1 tablet by mouth daily.    Dispense:  30 tablet    Refill:  3    Order Specific Question:   Supervising Provider    Answer:   Baxter Flattery, CYNTHIA [[id number]]   sulfamethoxazole-trimethoprim (BACTRIM DS) 800-160 MG tablet    Sig: Take 1 tablet by mouth daily.    Dispense:  30 tablet    Refill:  2    Order Specific Question:   Supervising Provider    Answer:   Judyann Munson [[id number]]     Follow-up: Return in about 2 months (around 11/22/2021), or if symptoms worsen or fail to improve.   Marcos Eke, MSN, FNP-C Nurse Practitioner [facility] for [facility] Main number: [phone]

## 2021-09-22 NOTE — Patient Instructions (Addendum)
Nice to see you.  We will check your lab work today.  Continue to take your medication daily as prescribed.  Refills have been sent to the pharmacy.  Plan for follow up in 2 months or sooner if needed with lab work on the same day.  Have a great day and stay safe!  

## 2021-09-22 NOTE — Assessment & Plan Note (Signed)
   Discussed importance of safe sexual practices and condom use.  Condoms offered.  Encouraged to complete routine dental care independently

## 2021-09-22 NOTE — Assessment & Plan Note (Signed)
Stephen Lee has good adherence and tolerance to Biktarvy supplemented with Bactrim for OI prophylaxis.  CD4 count most recently of 477 and discussed possibility of stopping Bactrim in 2 months.  Check lab work today.  Continue current dose of Biktarvy and Bactrim.  Plan for follow-up in 2 months or sooner if needed with lab work on the same day.

## 2021-09-25 LAB — HIV-1 RNA QUANT-NO REFLEX-BLD
HIV 1 RNA Quant: 58 Copies/mL — ABNORMAL HIGH
HIV-1 RNA Quant, Log: 1.76 Log cps/mL — ABNORMAL HIGH

## 2021-09-25 LAB — T-HELPER CELLS (CD4) COUNT (NOT AT ARMC)
Absolute CD4: 711 cells/uL (ref 490–1740)
CD4 T Helper %: 19 % — ABNORMAL LOW (ref 30–61)
Total lymphocyte count: 3714 cells/uL (ref 850–3900)

## 2021-10-02 ENCOUNTER — Other Ambulatory Visit (HOSPITAL_COMMUNITY): Payer: Self-pay

## 2021-10-10 ENCOUNTER — Other Ambulatory Visit (HOSPITAL_COMMUNITY): Payer: Self-pay

## 2021-10-12 ENCOUNTER — Other Ambulatory Visit (HOSPITAL_COMMUNITY): Payer: Self-pay

## 2021-10-17 ENCOUNTER — Other Ambulatory Visit (HOSPITAL_COMMUNITY): Payer: Self-pay

## 2021-10-18 ENCOUNTER — Other Ambulatory Visit (HOSPITAL_COMMUNITY): Payer: Self-pay

## 2021-11-08 ENCOUNTER — Other Ambulatory Visit (HOSPITAL_COMMUNITY): Payer: Self-pay

## 2021-11-20 ENCOUNTER — Other Ambulatory Visit (HOSPITAL_COMMUNITY): Payer: Self-pay

## 2021-12-01 ENCOUNTER — Encounter: Payer: Self-pay | Admitting: Infectious Diseases

## 2021-12-01 ENCOUNTER — Other Ambulatory Visit: Payer: Self-pay

## 2021-12-01 ENCOUNTER — Ambulatory Visit (INDEPENDENT_AMBULATORY_CARE_PROVIDER_SITE_OTHER): Payer: Managed Care, Other (non HMO) | Admitting: Infectious Diseases

## 2021-12-01 VITALS — BP 131/91 | HR 91 | Resp 16 | Ht 71.0 in | Wt 193.0 lb

## 2021-12-01 DIAGNOSIS — Z113 Encounter for screening for infections with a predominantly sexual mode of transmission: Secondary | ICD-10-CM | POA: Diagnosis not present

## 2021-12-01 DIAGNOSIS — B354 Tinea corporis: Secondary | ICD-10-CM | POA: Diagnosis not present

## 2021-12-01 DIAGNOSIS — R21 Rash and other nonspecific skin eruption: Secondary | ICD-10-CM

## 2021-12-01 DIAGNOSIS — A539 Syphilis, unspecified: Secondary | ICD-10-CM | POA: Diagnosis not present

## 2021-12-01 NOTE — Progress Notes (Signed)
Subjective:     Stephen Lee is a 31 y.o. male patient here today with concern over STI exposure/symptoms.   Well controlled HIV on once daily Biktarvy. Has noticed significant uptrend of weight Noticed a rash on the right arm last week - dry skin in circles on the right shoulder. Not particularly itchy just very coarse textured.  Scrotal itching that he has also noticed recently. Not clear if it is an STI or just from sweating.   No ulcers, bumps, lumps on the skin.    Review of Systems: Review of Systems  Constitutional:  Negative for chills and fever.  HENT:  Negative for sore throat.   Eyes:  Negative for visual disturbance.  Gastrointestinal:  Negative for abdominal pain, anal bleeding and rectal pain.  Genitourinary:  Negative for dysuria, genital sores, penile discharge, penile pain, scrotal swelling and testicular pain.  Musculoskeletal:  Negative for arthralgias and joint swelling.  Skin:  Negative for rash.  Neurological:  Negative for headaches.  Hematological:  Negative for adenopathy.     Sexual History:  Prefers male partner (1), oropharyngeal, rectal and penile sites exposed Condom use inconsistent   Past Medical History:  Diagnosis Date   Attention deficit    Condyloma acuminatum in male    perianal & anal canal   Depression     Outpatient Medications Prior to Visit  Medication Sig Dispense Refill   bictegravir-emtricitabine-tenofovir AF (BIKTARVY) 50-200-25 MG TABS tablet Take 1 tablet by mouth daily. 30 tablet 3   sulfamethoxazole-trimethoprim (BACTRIM DS) 800-160 MG tablet Take 1 tablet by mouth daily. 30 tablet 2   No facility-administered medications prior to visit.    No Known Allergies  History reviewed. No pertinent family history.      Objective:  Objective  Vitals:   12/01/21 1023  BP: (!) 131/91  Pulse: 91  Resp: 16  SpO2: 100%   Body mass index is 26.92 kg/m.   Physical Exam  Physical Exam Constitutional:       Appearance: Normal appearance.  Cardiovascular:     Rate and Rhythm: Normal rate and regular rhythm.  Genitourinary:    Penis: Normal.      Testes: Normal.     Comments: Red papular rash noted to intertriginous areas on folds of skin. No drainage, ulcers or other skin changes. Ruddy color noted. Feels a bit wet.  Skin:    General: Skin is warm.     Comments: Right shoulder with macular hyperpigmented skin with coarse texture.   Neurological:     Mental Status: He is alert and oriented to person, place, and time.          Assessment & Plan:    Problem List Items Addressed This Visit       Unprioritized   Syphilis - Primary   Relevant Orders   RPR   Rash and nonspecific skin eruption    Macular dry rash on the right shoulder. Will repeat RPR for completeness but seems more c/w eczema eruption. Will treat as such with topical options.       Tinea corporis    Scrotal rash most c/w tinea. He wanted to start using a topical Lotrimin spray first to see if this helps. Can do weekly fluconazole if needed going forward.  Counseled to use cotton undergarments, stay dry and avoid very tight clothing to help the rash resolve. Continue topical treatment for an additional week after symptoms resolve.  Other Visit Diagnoses     Routine screening for STI (sexually transmitted infection)       Relevant Orders   GC/CT Probe, Amp (Throat)   C. trachomatis/N. gonorrhoeae RNA   CT/NG RNA, TMA Rectal         Janene Madeira, MSN, NP-C [facility] [place] [facility] Office: [phone] Pager: [phone]

## 2021-12-01 NOTE — Assessment & Plan Note (Signed)
Macular dry rash on the right shoulder. Will repeat RPR for completeness but seems more c/w eczema eruption. Will treat as such with topical options.

## 2021-12-01 NOTE — Assessment & Plan Note (Signed)
Scrotal rash most c/w tinea. He wanted to start using a topical Lotrimin spray first to see if this helps. Can do weekly fluconazole if needed going forward.  Counseled to use cotton undergarments, stay dry and avoid very tight clothing to help the rash resolve. Continue topical treatment for an additional week after symptoms resolve.

## 2021-12-01 NOTE — Patient Instructions (Addendum)
For your arm rash - can do a combination of vaseline and shea butter multiple times a day to keep it moisturized.   Will let you know what your results are when they come back  For the rash on the groin - I think it is more related to yeast / sweating.  Lotromin spray to the groin. Keep your underwear dry with cotton and changing frequently).

## 2021-12-02 LAB — CT/NG RNA, TMA RECTAL
Chlamydia Trachomatis RNA: NOT DETECTED
Neisseria Gonorrhoeae RNA: NOT DETECTED

## 2021-12-02 LAB — GC/CHLAMYDIA PROBE, AMP (THROAT)
Chlamydia trachomatis RNA: NOT DETECTED
Neisseria gonorrhoeae RNA: NOT DETECTED

## 2021-12-02 LAB — C. TRACHOMATIS/N. GONORRHOEAE RNA
C. trachomatis RNA, TMA: NOT DETECTED
N. gonorrhoeae RNA, TMA: NOT DETECTED

## 2021-12-04 ENCOUNTER — Telehealth: Payer: Self-pay

## 2021-12-04 LAB — FLUORESCENT TREPONEMAL AB(FTA)-IGG-BLD: Fluorescent Treponemal ABS: REACTIVE — AB

## 2021-12-04 LAB — RPR TITER: RPR Titer: 1:4 {titer} — ABNORMAL HIGH

## 2021-12-04 LAB — RPR: RPR Ser Ql: REACTIVE — AB

## 2021-12-04 NOTE — Telephone Encounter (Signed)
Patient presented at [facility] clinic in person today. Stated he saw recent results on MyChart, but had questions about interpreting his results. Requested to speak with his provider.  Routed to provider.  Binnie Kand, RN

## 2021-12-18 ENCOUNTER — Other Ambulatory Visit (HOSPITAL_COMMUNITY): Payer: Self-pay

## 2021-12-21 ENCOUNTER — Other Ambulatory Visit (HOSPITAL_COMMUNITY): Payer: Self-pay

## 2022-01-11 ENCOUNTER — Other Ambulatory Visit: Payer: Self-pay | Admitting: Family

## 2022-01-11 ENCOUNTER — Other Ambulatory Visit (HOSPITAL_COMMUNITY): Payer: Self-pay

## 2022-01-11 MED ORDER — SULFAMETHOXAZOLE-TRIMETHOPRIM 800-160 MG PO TABS
1.0000 | ORAL_TABLET | Freq: Every day | ORAL | 0 refills | Status: DC
  Filled 2022-01-11 (×3): qty 30, 30d supply, fill #0

## 2022-01-12 ENCOUNTER — Ambulatory Visit: Payer: Managed Care, Other (non HMO) | Admitting: Family

## 2022-01-16 ENCOUNTER — Encounter: Payer: Self-pay | Admitting: Internal Medicine

## 2022-01-16 ENCOUNTER — Ambulatory Visit (INDEPENDENT_AMBULATORY_CARE_PROVIDER_SITE_OTHER): Payer: Self-pay | Admitting: Internal Medicine

## 2022-01-16 ENCOUNTER — Other Ambulatory Visit: Payer: Self-pay

## 2022-01-16 ENCOUNTER — Other Ambulatory Visit (HOSPITAL_COMMUNITY): Payer: Self-pay

## 2022-01-16 VITALS — BP 134/84 | HR 92 | Temp 98.0°F | Ht 71.0 in | Wt 182.0 lb

## 2022-01-16 DIAGNOSIS — L309 Dermatitis, unspecified: Secondary | ICD-10-CM

## 2022-01-16 DIAGNOSIS — B20 Human immunodeficiency virus [HIV] disease: Secondary | ICD-10-CM

## 2022-01-16 NOTE — Addendum Note (Signed)
Addended by: Marcell Anger on: 01/16/2022 09:11 AM   Modules accepted: Orders

## 2022-01-16 NOTE — Patient Instructions (Addendum)
Will ask our pharmacy team to review cabenuva with your insurance, if you qualify they'll contact you and set up transitioning to the shot   Hiv lab today -- follow up with [name] in 3 months otherwise  Continue biktarvy for now

## 2022-01-16 NOTE — Progress Notes (Signed)
      Subjective:     Stephen Lee is a 31 y.o. male patient here today for hiv care   He was seen late 11/2021 with Stephen Lee for concern over STI exposure/symptoms in setting acute RUE assymptomatic rash. He normally sees Stephen Lee for HIV care   He was dx'ed with eczema advised OTC topical steroid cream. Triple gc screen was negative. Rpr titer down trending 8 (6/23) --> 4 (10/23). He was treated for late latent syhilis about 2-3 months before this visit   Well controlled on biktarvy  Compliant with meds no missed dose last 4 weeks No acute sx or concern today  He has his own private insurance and he asks about cabenuva    Review of Systems: All other ros negative   Sexual History:  Prefers male partner (1), oropharyngeal, rectal and penile sites exposed Condom use inconsistent   Past Medical History:  Diagnosis Date   Attention deficit    Condyloma acuminatum in male    perianal & anal canal   Depression     Outpatient Medications Prior to Visit  Medication Sig Dispense Refill   bictegravir-emtricitabine-tenofovir AF (BIKTARVY) 50-200-25 MG TABS tablet Take 1 tablet by mouth daily. 30 tablet 3   sulfamethoxazole-trimethoprim (BACTRIM DS) 800-160 MG tablet Take 1 tablet by mouth daily. (need to schedule follow up [phone]) 30 tablet 0   No facility-administered medications prior to visit.    No Known Allergies  No family history on file.      Objective:  Objective  Vitals:   01/16/22 0845  BP: 134/84  Pulse: 92  Temp: 98 F (36.7 C)  SpO2: 98%   Body mass index is 25.38 kg/m.   Physical Exam  General/constitutional: no distress, pleasant HEENT: Normocephalic, PER, Conj Clear, EOMI, Oropharynx clear Neck supple CV: rrr no mrg Lungs: clear to auscultation, normal respiratory effort Abd: Soft, Nontender Ext: no edema Skin: rue hyperpigmented macular rash in circles/irregular shape (chronic per patient)      Assessment & Plan:     Problem List Items Addressed This Visit   None Visit Diagnoses     HIV disease (HCC)    -  Primary   Eczema, unspecified type           Lab Results  Component Value Date   CD4TCELL 19 (L) 09/22/2021   CD4TABS 477 08/21/2021   Lab Results  Component Value Date   HIV1RNAQUANT 58 (H) 09/22/2021    Will repeat hiv labs F/u 3 months with Stephen Lee  Patient is interested in transitioning from Stephen Lee to Stephen Lee Will ask our pharmacy team about cabenuva. He is a candidate but not sure if his insurance will cover     Stephen Alberts, MSN, NP-C [facility] for Infectious Disease [facility] Office: [phone] Pager: [phone]

## 2022-01-18 LAB — COMPLETE METABOLIC PANEL WITH GFR
AG Ratio: 1.2 (calc) (ref 1.0–2.5)
ALT: 19 U/L (ref 9–46)
AST: 34 U/L (ref 10–40)
Albumin: 4.5 g/dL (ref 3.6–5.1)
Alkaline phosphatase (APISO): 92 U/L (ref 36–130)
BUN: 11 mg/dL (ref 7–25)
CO2: 29 mmol/L (ref 20–32)
Calcium: 10.3 mg/dL (ref 8.6–10.3)
Chloride: 103 mmol/L (ref 98–110)
Creat: 1.08 mg/dL (ref 0.60–1.26)
Globulin: 3.8 g/dL (calc) — ABNORMAL HIGH (ref 1.9–3.7)
Glucose, Bld: 95 mg/dL (ref 65–99)
Potassium: 4.1 mmol/L (ref 3.5–5.3)
Sodium: 140 mmol/L (ref 135–146)
Total Bilirubin: 0.3 mg/dL (ref 0.2–1.2)
Total Protein: 8.3 g/dL — ABNORMAL HIGH (ref 6.1–8.1)
eGFR: 94 mL/min/{1.73_m2} (ref >=60)

## 2022-01-18 LAB — CBC WITH DIFFERENTIAL/PLATELET
Absolute Monocytes: 781 cells/uL (ref 200–950)
Basophils Absolute: 38 cells/uL (ref 0–200)
Basophils Relative: 0.6 %
Eosinophils Absolute: 88 cells/uL (ref 15–500)
Eosinophils Relative: 1.4 %
HCT: 45 % (ref 38.5–50.0)
Hemoglobin: 15.7 g/dL (ref 13.2–17.1)
Lymphs Abs: 2690 cells/uL (ref 850–3900)
MCH: 31.5 pg (ref 27.0–33.0)
MCHC: 34.9 g/dL (ref 32.0–36.0)
MCV: 90.2 fL (ref 80.0–100.0)
MPV: 10.4 fL (ref 7.5–12.5)
Monocytes Relative: 12.4 %
Neutro Abs: 2703 cells/uL (ref 1500–7800)
Neutrophils Relative %: 42.9 %
Platelets: 337 10*3/uL (ref 140–400)
RBC: 4.99 10*6/uL (ref 4.20–5.80)
RDW: 12.2 % (ref 11.0–15.0)
Total Lymphocyte: 42.7 %
WBC: 6.3 10*3/uL (ref 3.8–10.8)

## 2022-01-18 LAB — HIV-1 RNA QUANT-NO REFLEX-BLD
HIV 1 RNA Quant: <20 Copies/mL — ABNORMAL HIGH
HIV-1 RNA Quant, Log: <1.3 Log cps/mL — ABNORMAL HIGH

## 2022-02-06 ENCOUNTER — Other Ambulatory Visit: Payer: Self-pay

## 2022-02-06 ENCOUNTER — Other Ambulatory Visit (HOSPITAL_COMMUNITY): Payer: Self-pay

## 2022-02-16 ENCOUNTER — Telehealth: Payer: Self-pay

## 2022-02-16 ENCOUNTER — Other Ambulatory Visit: Payer: Self-pay | Admitting: Family

## 2022-02-16 ENCOUNTER — Other Ambulatory Visit: Payer: Self-pay

## 2022-02-16 ENCOUNTER — Other Ambulatory Visit (HOSPITAL_COMMUNITY): Payer: Self-pay

## 2022-02-16 DIAGNOSIS — B20 Human immunodeficiency virus [HIV] disease: Secondary | ICD-10-CM

## 2022-02-16 MED ORDER — BIKTARVY 50-200-25 MG PO TABS
1.0000 | ORAL_TABLET | Freq: Every day | ORAL | 1 refills | Status: DC
  Filled 2022-02-16: qty 30, 30d supply, fill #0
  Filled 2022-03-14 – 2022-03-22 (×2): qty 30, 30d supply, fill #1

## 2022-02-16 NOTE — Telephone Encounter (Signed)
Okay to stop the Bactrim?

## 2022-02-16 NOTE — Telephone Encounter (Signed)
Called patient to schedule follow up appointment and let him know Marya Amsler would like him to stop the Bactrim, no answer. Left HIPAA compliant voicemail requesting callback.   Beryle Flock, RN

## 2022-02-19 ENCOUNTER — Other Ambulatory Visit (HOSPITAL_COMMUNITY): Payer: Self-pay

## 2022-02-19 ENCOUNTER — Other Ambulatory Visit: Payer: Self-pay

## 2022-02-24 ENCOUNTER — Other Ambulatory Visit (HOSPITAL_COMMUNITY): Payer: Self-pay

## 2022-02-27 ENCOUNTER — Other Ambulatory Visit (HOSPITAL_COMMUNITY): Payer: Self-pay

## 2022-02-27 ENCOUNTER — Other Ambulatory Visit: Payer: Self-pay

## 2022-03-09 ENCOUNTER — Other Ambulatory Visit (HOSPITAL_COMMUNITY): Payer: Self-pay

## 2022-03-14 ENCOUNTER — Other Ambulatory Visit (HOSPITAL_COMMUNITY): Payer: Self-pay

## 2022-03-22 ENCOUNTER — Other Ambulatory Visit (HOSPITAL_COMMUNITY): Payer: Self-pay

## 2022-03-22 ENCOUNTER — Other Ambulatory Visit: Payer: Self-pay

## 2022-04-16 ENCOUNTER — Other Ambulatory Visit (HOSPITAL_COMMUNITY): Payer: Self-pay

## 2022-05-02 ENCOUNTER — Other Ambulatory Visit: Payer: Self-pay

## 2022-05-02 ENCOUNTER — Other Ambulatory Visit (HOSPITAL_COMMUNITY): Payer: Self-pay

## 2022-05-02 ENCOUNTER — Encounter: Payer: Self-pay | Admitting: Family

## 2022-05-02 ENCOUNTER — Ambulatory Visit (INDEPENDENT_AMBULATORY_CARE_PROVIDER_SITE_OTHER): Payer: Managed Care, Other (non HMO) | Admitting: Family

## 2022-05-02 VITALS — BP 132/82 | HR 95 | Temp 98.3°F | Wt 188.0 lb

## 2022-05-02 DIAGNOSIS — R4589 Other symptoms and signs involving emotional state: Secondary | ICD-10-CM

## 2022-05-02 DIAGNOSIS — B20 Human immunodeficiency virus [HIV] disease: Secondary | ICD-10-CM | POA: Diagnosis not present

## 2022-05-02 DIAGNOSIS — A539 Syphilis, unspecified: Secondary | ICD-10-CM | POA: Diagnosis not present

## 2022-05-02 DIAGNOSIS — Z0000 Encounter for general adult medical examination without abnormal findings: Secondary | ICD-10-CM

## 2022-05-02 DIAGNOSIS — Z23 Encounter for immunization: Secondary | ICD-10-CM | POA: Diagnosis not present

## 2022-05-02 MED ORDER — BIKTARVY 50-200-25 MG PO TABS
1.0000 | ORAL_TABLET | Freq: Every day | ORAL | 1 refills | Status: DC
  Filled 2022-05-02: qty 30, 30d supply, fill #0

## 2022-05-02 NOTE — Assessment & Plan Note (Signed)
Stephen Lee meets the requirements for an emotional support animal that helps his coping mechanisms associated with his mental health related to his HIV diagnosis. Discussed the difference between emotional support animal and service animal. Letter to landlord printed.

## 2022-05-02 NOTE — Progress Notes (Signed)
Brief Narrative   Patient ID: Stephen Lee, male    DOB: January 13, 1991, 32 y.o.   MRN: [id number]  Stephen Lee is a 65 y/o AA male with HIV disease diagnosed on 07/19/21 with risk factor of MSM. Initial viral load was 580,000 with CD4 count of 115. Initial Genotype with novel mutations V35V/I, V60I, R83K, Q102K, D123E, C162S, V179D, G196E, T200I, R211K, V245Q, A272S, R277R/K, V292I conferring potential low level resistance to NNRTI's efavirenz, etravirine, nevirapine, and rilpivirine. 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 Was co-infected with syphilis at the time of diagnosis (titer 1:8). Treatment naive. Started on [place] on entry to care.    Subjective:    Chief Complaint  Patient presents with   Follow-up    HPI:  Stephen Lee is a 32 y.o. male with HIV disease last seen by Dr. Gale Journey on 01/16/22 with well controlled virus and good adherence and tolerance to [place]. Viral load was undetectable and CD4 count 477. Renal function, liver function and electrolytes within normal ranges. RPR titer coming down with most recent 1:4. Was interested in [place] however has low grade resistance to NNRTIs. Here today for follow up.   Stephen Lee has been doing okay since his last office visit. 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 Has about 2 weeks of Biktarvy remaining and new insurance is likely to start in about 1 month. Continues to take Biktarvy with no adverse side effects and currently with no problems obtaining from the pharmacy. Requesting a letter for his landlord for an emotional support dog who helps with his mental health and coping with his HIV diagnosis. Condoms and STD testing offered. Due for tetanus and Menveo vaccinations as well as routine dental care.   Denies fevers, chills, night sweats, headaches, changes in vision, neck pain/stiffness, nausea, diarrhea, vomiting, lesions or rashes.  No Known  Allergies    Outpatient Medications Prior to Visit  Medication Sig Dispense Refill   bictegravir-emtricitabine-tenofovir AF (BIKTARVY) 50-200-25 MG TABS tablet Take 1 tablet by mouth daily. 30 tablet 1   No facility-administered medications prior to visit.     Past Medical History:  Diagnosis Date   Attention deficit    Condyloma acuminatum in male    perianal & anal canal   Depression      History reviewed. No pertinent surgical history.    Review of Systems    Objective:    BP 132/82   Pulse 95   Temp 98.3 F (36.8 C) (Temporal)   Wt 188 lb (85.3 kg)   SpO2 95%   BMI 26.22 kg/m  Nursing note and vital signs reviewed.  Physical Exam      05/02/2022    1:44 PM 01/16/2022    8:49 AM 12/01/2021   10:23 AM 09/22/2021   11:20 AM 08/21/2021    4:40 PM  Depression screen PHQ 2/9  Decreased Interest 0 0 0 0 0  Down, Depressed, Hopeless 0 0 0 0 0  PHQ - 2 Score 0 0 0 0 0       Assessment & Plan:    Patient Active Problem List   Diagnosis Date Noted   Need for emotional support 05/02/2022   Rash and nonspecific skin eruption 12/01/2021   Tinea corporis 12/01/2021   Syphilis 08/21/2021   Healthcare maintenance 08/21/2021   Therapeutic drug monitoring 08/21/2021   AIDS ([place]) 07/22/2021   Hypokalemia 07/19/2021  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 Reviewed previous lab work and discussed plan of care. Check lab work. Will meet with financial counselor for temporary assistance while awaiting new insurance. Continue current dose of Biktarvy. Plan for follow up in 4 months or sooner if needed with lab work on the same day.        Relevant Medications   bictegravir-emtricitabine-tenofovir AF (BIKTARVY) 50-200-25 MG TABS tablet   Other Relevant Orders   HIV-1 RNA quant-no reflex-bld   T-helper cell (CD4)- ([facility] clinic only)   COMPLETE METABOLIC PANEL WITH GFR   MENINGOCOCCAL MCV4O (Completed)   Tdap vaccine greater than or equal to 7yo IM (Completed)   Syphilis    Syphilis titer continues to come down slowly. Check RPR.       Relevant Medications   bictegravir-emtricitabine-tenofovir AF (BIKTARVY) 50-200-25 MG TABS tablet   Other Relevant Orders   RPR   Healthcare maintenance    Discussed importance of safe sexual practice and condom use. Condoms and STD testing offered.  Tetanus and Menveo updated. Introduced anal pap smear recommendations and will consider at next office visit.       Need for emotional support    Stephen Lee meets the requirements for an emotional support animal that helps his coping mechanisms associated with his mental health related to his HIV diagnosis. Discussed the difference between emotional support animal and service animal. Letter to landlord printed.       Other Visit Diagnoses     Need for Tdap vaccination       Relevant Orders   Tdap vaccine greater than or equal to 7yo IM (Completed)   Need for meningococcal vaccination       Relevant Orders   MENINGOCOCCAL MCV4O (Completed)        I am having Stephen Lee maintain his Biktarvy.   Meds ordered this encounter  Medications   bictegravir-emtricitabine-tenofovir AF (BIKTARVY) 50-200-25 MG TABS tablet    Sig: Take 1 tablet by mouth daily.    Dispense:  30 tablet    Refill:  1    Patient needs appointment    Order Specific Question:   Supervising Provider    Answer:   Carlyle Basques [[id number]]     Follow-up: Return in about 4 months (around 09/01/2022), or if symptoms worsen or fail to improve.   Terri Piedra, MSN, FNP-C Nurse Practitioner [facility] for Infectious Disease [place] number: [phone]

## 2022-05-02 NOTE — Patient Instructions (Addendum)
Nice to see you.  We will check your lab work today.  Continue to take your medication daily as prescribed.  Refills have been sent to the pharmacy.  Plan for follow up in 4 months or sooner if needed with lab work on the same day.  Have a great day and stay safe!  

## 2022-05-02 NOTE — Assessment & Plan Note (Signed)
Discussed importance of safe sexual practice and condom use. Condoms and STD testing offered.  Tetanus and Menveo updated. Introduced anal pap smear recommendations and will consider at next office visit.

## 2022-05-02 NOTE — Assessment & Plan Note (Signed)
Syphilis titer continues to come down slowly. Check RPR.

## 2022-05-02 NOTE — Assessment & Plan Note (Signed)
Stephen Lee continues to have well controlled virus with good adherence and tolerance to [employer]. Reviewed previous lab work and discussed plan of care. Check lab work. Will meet with financial counselor for temporary assistance while awaiting new insurance. Continue current dose of Biktarvy. Plan for follow up in 4 months or sooner if needed with lab work on the same day.

## 2022-05-03 ENCOUNTER — Other Ambulatory Visit (HOSPITAL_COMMUNITY): Payer: Self-pay

## 2022-05-03 LAB — T-HELPER CELL (CD4) - (RCID CLINIC ONLY)
CD4 % Helper T Cell: 22 % — ABNORMAL LOW (ref 33–65)
CD4 T Cell Abs: 385 /uL — ABNORMAL LOW (ref 400–1790)

## 2022-05-04 LAB — COMPLETE METABOLIC PANEL WITH GFR
AG Ratio: 1.3 (calc) (ref 1.0–2.5)
ALT: 18 U/L (ref 9–46)
AST: 35 U/L (ref 10–40)
Albumin: 4.5 g/dL (ref 3.6–5.1)
Alkaline phosphatase (APISO): 99 U/L (ref 36–130)
BUN: 11 mg/dL (ref 7–25)
CO2: 26 mmol/L (ref 20–32)
Calcium: 9.8 mg/dL (ref 8.6–10.3)
Chloride: 105 mmol/L (ref 98–110)
Creat: 1.21 mg/dL (ref 0.60–1.26)
Globulin: 3.5 g/dL (calc) (ref 1.9–3.7)
Glucose, Bld: 82 mg/dL (ref 65–99)
Potassium: 3.8 mmol/L (ref 3.5–5.3)
Sodium: 143 mmol/L (ref 135–146)
Total Bilirubin: 0.3 mg/dL (ref 0.2–1.2)
Total Protein: 8 g/dL (ref 6.1–8.1)
eGFR: 81 mL/min/{1.73_m2} (ref >=60)

## 2022-05-04 LAB — HIV-1 RNA QUANT-NO REFLEX-BLD
HIV 1 RNA Quant: 28 Copies/mL — ABNORMAL HIGH
HIV-1 RNA Quant, Log: 1.45 Log cps/mL — ABNORMAL HIGH

## 2022-05-04 LAB — T PALLIDUM AB: T Pallidum Abs: POSITIVE — AB

## 2022-05-04 LAB — RPR: RPR Ser Ql: REACTIVE — AB

## 2022-05-04 LAB — RPR TITER: RPR Titer: 1:2 {titer} — ABNORMAL HIGH

## 2022-05-16 ENCOUNTER — Telehealth: Payer: Self-pay

## 2022-05-16 DIAGNOSIS — B20 Human immunodeficiency virus [HIV] disease: Secondary | ICD-10-CM

## 2022-05-16 MED ORDER — BIKTARVY 50-200-25 MG PO TABS: 1.0000 | ORAL_TABLET | Freq: Every day | ORAL | 2 refills | Status: DC

## 2022-05-16 NOTE — Telephone Encounter (Signed)
Patient returned call and made aware of pharmacy changed. Patient appreciated communication. Eugenia Mcalpine, LPN

## 2022-05-16 NOTE — Telephone Encounter (Signed)
Received call from [place], patient's ADAP has been approved, but they do not have a prescription for the patient.   Refills of Biktarvy sent to [facility]. Called patient to notify him of pharmacy change, no answer. Left HIPAA compliant voicemail requesting callback.   Beryle Flock, RN

## 2022-06-06 DIAGNOSIS — M79604 Pain in right leg: Secondary | ICD-10-CM | POA: Insufficient documentation

## 2022-06-07 ENCOUNTER — Emergency Department (HOSPITAL_BASED_OUTPATIENT_CLINIC_OR_DEPARTMENT_OTHER): Payer: Managed Care, Other (non HMO)

## 2022-06-07 ENCOUNTER — Encounter (HOSPITAL_BASED_OUTPATIENT_CLINIC_OR_DEPARTMENT_OTHER): Payer: Self-pay

## 2022-06-07 ENCOUNTER — Emergency Department (HOSPITAL_BASED_OUTPATIENT_CLINIC_OR_DEPARTMENT_OTHER)
Admission: EM | Admit: 2022-06-07 | Discharge: 2022-06-07 | Disposition: A | Discharge status: Home / Self Care | Payer: Managed Care, Other (non HMO) | Attending: Emergency Medicine | Admitting: Emergency Medicine

## 2022-06-07 DIAGNOSIS — M79604 Pain in right leg: Secondary | ICD-10-CM

## 2022-06-07 HISTORY — DX: Asymptomatic human immunodeficiency virus (hiv) infection status: Z21

## 2022-06-07 HISTORY — DX: Human immunodeficiency virus (HIV) disease: B20

## 2022-06-07 MED ORDER — CYCLOBENZAPRINE HCL 10 MG PO TABS: 10.0000 mg | ORAL_TABLET | Freq: Three times a day (TID) | ORAL | 0 refills | Status: DC | PRN

## 2022-06-07 MED ORDER — NAPROXEN 250 MG PO TABS
500.0000 mg | ORAL_TABLET | Freq: Once | ORAL | Status: AC
  Administered 2022-06-07: 500 mg via ORAL
  Filled 2022-06-07: qty 2

## 2022-06-07 MED ORDER — NAPROXEN 375 MG PO TABS: ORAL_TABLET | ORAL | 0 refills | Status: DC

## 2022-06-07 MED ORDER — CYCLOBENZAPRINE HCL 10 MG PO TABS
10.0000 mg | ORAL_TABLET | Freq: Once | ORAL | Status: AC
  Administered 2022-06-07: 10 mg via ORAL
  Filled 2022-06-07: qty 1

## 2022-06-07 NOTE — ED Triage Notes (Signed)
Pt c/o right leg pain. Pt reports pain starts in right knee and now right thigh is tight that started 2 weeks ago. Pt reports he has tried OTC medications and heating pad with no relief.

## 2022-06-07 NOTE — ED Notes (Signed)
Pt describes left leg pain, knee to groin.  No obvious swelling.  States OTC meds and heat are not effective

## 2022-06-07 NOTE — ED Provider Notes (Signed)
[facility] Provider Note: Lowella Dell, MD, FACEP  CSN: [id number] MRN: [id number] ARRIVAL: 06/06/22 at 2356 ROOM: MH12/MH12   CHIEF COMPLAINT  Leg Pain   HISTORY OF PRESENT ILLNESS  06/07/22 1:11 AM Stephen Lee is a 32 y.o. male with pain in his right leg.  It started in his right lateral knee about 2 weeks ago and is now primarily in his right medial thigh.  The muscles of the right thigh are tense to palpation.  He rates his pain as a 7 out of 10, worse with certain movements but somewhat improved with Tylenol and squatting.   Past Medical History:  Diagnosis Date   Attention deficit    Condyloma acuminatum in male    perianal & anal canal   Depression     History reviewed. No pertinent surgical history.  History reviewed. No pertinent family history.  Social History   Tobacco Use   Smoking status: Never   Smokeless tobacco: Never  Vaping Use   Vaping Use: Never used  Substance Use Topics   Alcohol use: Not Currently   Drug use: No    Prior to Admission medications   Medication Sig Start Date End Date Taking? Authorizing Provider  cyclobenzaprine (FLEXERIL) 10 MG tablet Take 1 tablet (10 mg total) by mouth 3 (three) times daily as needed for muscle spasms. 06/07/22  Yes Peytin Dechert, MD  naproxen (NAPROSYN) 375 MG tablet Take 1 tablet twice daily as needed for leg pain. 06/07/22  Yes Dequincy Born, MD  sulfamethoxazole-trimethoprim (BACTRIM DS) 800-160 MG tablet Take 1 tablet by mouth daily. 05/23/22  Yes [provider]  bictegravir-emtricitabine-tenofovir AF (BIKTARVY) 50-200-25 MG TABS tablet Take 1 tablet by mouth daily. 05/16/22 09/13/22  Veryl Speak, FNP    Allergies Patient has no known allergies.   REVIEW OF SYSTEMS  Negative except as noted here or in the History of Present Illness.   PHYSICAL EXAMINATION  Initial Vital Signs Blood pressure (!) 141/92, pulse (!) 117, temperature 99.1 F (37.3 C), temperature source Oral,  resp. rate 18, height  (1.803 m), weight 81.6 kg, SpO2 99 %.  Examination General: Well-developed, well-nourished male in no acute distress; appearance consistent with age of record HENT: normocephalic; atraumatic Eyes: Normal appearance Neck: supple Heart: regular rate and rhythm Lungs: clear to auscultation bilaterally Abdomen: soft; nondistended; nontender; bowel sounds present Extremities: No deformity; tenseness of muscles of right medial thigh with mild tenderness; pulses normal Neurologic: Awake, alert and oriented; motor function intact in all extremities and symmetric; no facial droop Skin: Warm and dry Psychiatric: Normal mood and affect   RESULTS  Summary of this visit's results, reviewed and interpreted by myself:   EKG Interpretation  Date/Time:    Ventricular Rate:    PR Interval:    QRS Duration:   QT Interval:    QTC Calculation:   R Axis:     Text Interpretation:         Laboratory Studies: No results found for this or any previous visit (from the past 24 hour(s)). Imaging Studies: US Venous Img Lower Unilateral Right  Result Date: 06/07/2022 CLINICAL DATA:  Right leg pain and swelling EXAM: RIGHT LOWER EXTREMITY VENOUS DOPPLER ULTRASOUND TECHNIQUE: Gray-scale sonography with compression, as well as color and duplex ultrasound, were performed to evaluate the deep venous system(s) from the level of the common femoral vein through the popliteal and proximal calf veins. COMPARISON:  None Available. FINDINGS: VENOUS Normal compressibility of the common femoral,  superficial femoral, and popliteal veins, as well as the visualized calf veins. Visualized portions of profunda femoral vein and great saphenous vein unremarkable. No filling defects to suggest DVT on grayscale or color Doppler imaging. Doppler waveforms show normal direction of venous flow, normal respiratory plasticity and response to augmentation. Limited views of the contralateral common femoral  vein are unremarkable. OTHER None. Limitations: none IMPRESSION: Negative. Electronically Signed   By: Helyn Numbers M.D.   On: 06/07/2022 01:48    ED COURSE and MDM  Nursing notes, initial and subsequent vitals signs, including pulse oximetry, reviewed and interpreted by myself.  Vitals:   06/07/22 0004 06/07/22 0016  BP: (!) 141/92   Pulse: (!) 117   Resp: 18   Temp: 99.1 F (37.3 C)   TempSrc: Oral   SpO2: 99%   Weight:  81.6 kg  Height:   (1.803 m)   Medications  naproxen (NAPROSYN) tablet 500 mg (has no administration in time range)  cyclobenzaprine (FLEXERIL) tablet 10 mg (has no administration in time range)    1:51 AM No evidence of deep vein thrombosis in right lower extremity.  I suspect his pain is musculoskeletal.  We will treat with an NSAID and muscle relaxer.  PROCEDURES  Procedures   ED DIAGNOSES     ICD-10-CM   1. Right leg pain  M79.604          Shamika Pedregon, Jonny Ruiz, MD 06/07/22 [phone]

## 2022-06-27 ENCOUNTER — Other Ambulatory Visit (INDEPENDENT_AMBULATORY_CARE_PROVIDER_SITE_OTHER): Payer: Managed Care, Other (non HMO)

## 2022-06-27 ENCOUNTER — Ambulatory Visit (INDEPENDENT_AMBULATORY_CARE_PROVIDER_SITE_OTHER): Payer: Managed Care, Other (non HMO) | Admitting: Physician Assistant

## 2022-06-27 ENCOUNTER — Encounter: Payer: Self-pay | Admitting: Physician Assistant

## 2022-06-27 ENCOUNTER — Telehealth: Payer: Self-pay

## 2022-06-27 DIAGNOSIS — M79605 Pain in left leg: Secondary | ICD-10-CM

## 2022-06-27 MED ORDER — MELOXICAM 15 MG PO TABS: 15.0000 mg | ORAL_TABLET | Freq: Every day | ORAL | 0 refills | Status: DC

## 2022-06-27 MED ORDER — METHOCARBAMOL 500 MG PO TABS: 500.0000 mg | ORAL_TABLET | Freq: Four times a day (QID) | ORAL | 0 refills | Status: DC | PRN

## 2022-06-27 NOTE — Telephone Encounter (Signed)
Patient called to schedule same day appointment, but we did schedule for Wednesday with his provider [name] on 05/22. Patient stated that Ortho recommended to follow up with his ID provider in relation to his leg pain. Patient also stated that it could be a concern of medication.

## 2022-06-27 NOTE — Progress Notes (Signed)
Office Visit Note   Patient: Stephen Lee           Date of Birth: 04/24/1990           MRN: [id number] Visit Date: 06/27/2022              Requested by: No referring provider defined for this encounter. PCP: System, Provider Not In   Assessment & Plan: Visit Diagnoses:  1. Pain in left leg     Plan: Patient is a pleasant 32 year old gentleman who presents with muscle pain and tightness in both of his legs.  This has been going on for about a month.  Denies any injuries.  Denies any fever or chills.  Denies any change in activities.  Rates the tightness and pain as moderate especially after has been sitting a while.  Denies any paresthesias.  Pain goes into the musculature of the medial thigh bilaterally sometimes into his groin and down to his knee.  Exam did not really indicate any kind of pain hip issues.  Was more muscular.  Compartments were soft and compressible he had good strength and was neurovascular intact.  I recommended to change his anti-inflammatory and take it regularly for few weeks will call in some meloxicam as well as some more muscle relaxant.  He does take Biktarvy for his HIV.  I told him I am not familiar with all the side effects of this medication or with his medical history.  I do think it would be worth him checking in with his infectious disease nurse practitioner to see if any of this could be caused from medications or conditions that he has.  He is going to do this.  He will follow-up with me in a month after physical therapy  Follow-Up Instructions: 1 month  Orders:  Orders Placed This Encounter  Procedures   XR Lumbar Spine 2-3 Views   No orders of the defined types were placed in this encounter.     Procedures: No procedures performed   Clinical Data: No additional findings.   Subjective: Chief Complaint  Patient presents with   Left Leg - Pain   Right Leg - Pain    HPI Patient is a 32 year old gentleman with a chief complaint with  bilateral leg pain.  He initially had problems with his right lower leg.  He was seen in the emergency room at the end of April.  He had an ultrasound in the emergency room which did not demonstrate any evidence of DVT or any other abnormalities.  His right leg has improved and now his left leg is having the same pain.  Been going on now 4 days.  He has been taping naproxen and muscle relaxer without any help.  Pain goes into the groin alternates sides he feels like it radiates proximally Review of Systems  All other systems reviewed and are negative.    Objective: Vital Signs: There were no vitals taken for this visit.  Physical Exam Constitutional:      Appearance: Normal appearance.  Pulmonary:     Effort: Pulmonary effort is normal.  Skin:    General: Skin is warm and dry.  Neurological:     General: No focal deficit present.     Mental Status: He is alert.     Ortho Exam Examination he has no muscle wasting he is neurovascularly intact he has good strength in his lower extremities with resisted flexion extension of his legs flexion and extension of his ankles.  No tenderness over the knee joints.  No tenderness with manipulation of his hip.  Most of his pain is when he flexes his thighs and it reoccurs on the top and the quadriceps and medialis no lower back pain Specialty Comments:  No specialty comments available.  Imaging: No results found.   PMFS History: Patient Active Problem List   Diagnosis Date Noted   Need for emotional support 05/02/2022   Rash and nonspecific skin eruption 12/01/2021   Tinea corporis 12/01/2021   Syphilis 08/21/2021   Healthcare maintenance 08/21/2021   Therapeutic drug monitoring 08/21/2021   AIDS (HCC) 07/22/2021   Hypokalemia 07/19/2021   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   HIV (human immunodeficiency virus infection) (HCC) 07/19/2021   Condyloma acuminatum in male 10/12/2010   Past Medical History:  Diagnosis Date    Attention deficit    Condyloma acuminatum in male    perianal & anal canal   Depression    HIV (human immunodeficiency virus infection) (HCC)     History reviewed. No pertinent family history.  History reviewed. No pertinent surgical history. Social History   Occupational History   Not on file  Tobacco Use   Smoking status: Never   Smokeless tobacco: Never  Vaping Use   Vaping Use: Never used  Substance and Sexual Activity   Alcohol use: Not Currently   Drug use: No   Sexual activity: Yes    Birth control/protection: None    Comment: declined condoms       ReThis morningNo right greater than theExam pain  YesYeahExact    YesPartial Thank you have himYesKNPlan will getA lot of those mayItOhYeah I think after goingTodayWith a black-and-white:YesRemainsLast I wasSo I meanRight  that he and his grandmotherI askedReview madeSheCraig   COVID-19 positive test (U07.1, COVID-19) with Acute Pneumonia (J12.89, Other viral pneumonia) (If respiratory failure or sepsis present, add as separate assessment)  Gabrielle timeTotalSleep  SelfThere is a realPLAN Injected wallPlan:MajorNoticedSome.PsychologistDetermineYou willSoThis particular,She will returnCross is normalLeft  IOh Diet up follow-upFollowCallviewed

## 2022-07-03 ENCOUNTER — Other Ambulatory Visit (HOSPITAL_COMMUNITY): Payer: Self-pay

## 2022-07-03 ENCOUNTER — Telehealth: Payer: Self-pay

## 2022-07-03 NOTE — Telephone Encounter (Signed)
[facility] Patient Product/process development scientist completed.    The patient is insured through [employer] and has a $500.00 copay.  Patient is already enrolled in a [employer] copay card that will make his copay $0.00.  BIN: [id number] PCN: [id number] Group: [id number] ID: [id number]  We will continue to follow to see if copay assistance is needed.  Clearance Coots, CPhT Specialty Pharmacy Patient [facility] for Infectious Disease Phone: [phone] Fax:  [phone]

## 2022-07-04 ENCOUNTER — Ambulatory Visit: Payer: Managed Care, Other (non HMO) | Admitting: Family

## 2022-07-17 ENCOUNTER — Ambulatory Visit: Payer: Managed Care, Other (non HMO) | Admitting: Physical Therapy

## 2022-07-24 ENCOUNTER — Other Ambulatory Visit: Payer: Self-pay | Admitting: Physician Assistant

## 2022-07-27 ENCOUNTER — Telehealth: Payer: Self-pay | Admitting: Physician Assistant

## 2022-07-27 ENCOUNTER — Ambulatory Visit: Payer: Managed Care, Other (non HMO) | Admitting: Physician Assistant

## 2022-07-27 NOTE — Telephone Encounter (Signed)
Called pt 1X and left voicemail for pt to reschedule for PA Chales Abrahams. Provider not in office today

## 2022-08-03 ENCOUNTER — Ambulatory Visit: Payer: Managed Care, Other (non HMO) | Admitting: Physician Assistant

## 2022-08-03 ENCOUNTER — Ambulatory Visit: Payer: Managed Care, Other (non HMO) | Admitting: Rehabilitative and Restorative Service Providers"

## 2022-08-09 ENCOUNTER — Other Ambulatory Visit: Payer: Self-pay

## 2022-08-09 ENCOUNTER — Encounter: Payer: Self-pay | Admitting: Rehabilitative and Restorative Service Providers"

## 2022-08-09 ENCOUNTER — Ambulatory Visit (INDEPENDENT_AMBULATORY_CARE_PROVIDER_SITE_OTHER): Payer: Managed Care, Other (non HMO) | Admitting: Rehabilitative and Restorative Service Providers"

## 2022-08-09 DIAGNOSIS — R600 Localized edema: Secondary | ICD-10-CM

## 2022-08-09 DIAGNOSIS — M79605 Pain in left leg: Secondary | ICD-10-CM

## 2022-08-09 DIAGNOSIS — R262 Difficulty in walking, not elsewhere classified: Secondary | ICD-10-CM

## 2022-08-09 DIAGNOSIS — M6281 Muscle weakness (generalized): Secondary | ICD-10-CM | POA: Diagnosis not present

## 2022-08-09 NOTE — Therapy (Addendum)
OUTPATIENT PHYSICAL THERAPY LOWER EXTREMITY EVALUATION  / DISCHARGE   Patient Name: Stephen Lee MRN: [id number] DOB:1990/06/01, 32 y.o., male Today's Date: 08/09/2022  END OF SESSION:  PT End of Session - 08/09/22 1320     Visit Number 1    Number of Visits 15    Date for PT Re-Evaluation 10/04/22    Progress Note Due on Visit 15    PT Start Time 0930    PT Stop Time 1017    PT Time Calculation (min) 47 min    Activity Tolerance Patient tolerated treatment well;No increased pain    Behavior During Therapy WFL for tasks assessed/performed             Past Medical History:  Diagnosis Date   Attention deficit    Condyloma acuminatum in male    perianal & anal canal   Depression    HIV (human immunodeficiency virus infection) (HCC)    History reviewed. No pertinent surgical history. 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  SUBJECTIVE:   SUBJECTIVE STATEMENT: Stephen Lee notes left hip, gluteal, hamstrings and knee pain that started in February of this year.  On February 14, he switched to a new job that involved more standing and walking than his previous  job.  Hip/groin, leg, hamstrings and knee pain has been a problem since.  He is now doing 14K - 15K steps a day vs his previous job that was "mostly" sitting.  Things are no better or worse over the past month.  PERTINENT HISTORY: HIV, depression, attention deficit  PAIN:  Are you having pain? Yes: NPRS scale: 0-9/10 Pain location: Left hip, knee and thigh Pain description: Ache, can get severe Aggravating factors: Prolonged standing and weight-bearing Relieving factors: Tylenol, ice and rest  PRECAUTIONS: None  WEIGHT BEARING RESTRICTIONS: No  FALLS:  Has patient fallen in last 6 months? 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  NEXT MD VISIT: 08/23/2022  OBJECTIVE:   DIAGNOSTIC FINDINGS: 2 views of his lumbar spine were obtained today.  No evidence of listhesis  well-maintained joint spacing no significant evidence of any degenerative  changes  PATIENT SURVEYS:  FOTO 38 (Goal 70 in 15 visits)  COGNITION: Overall cognitive status: Within functional limits for tasks assessed     SENSATION: No complaints of tingling or numbness  MUSCLE LENGTH: Hamstrings: Right 45 deg; Left 50 deg   LOWER EXTREMITY ROM:  Passive ROM Left/Right 08/09/2022   Hip flexion 90/95   Hip extension    Hip abduction    Hip adduction    Hip internal rotation 0/-3   Hip external rotation 19/26   Knee flexion    Knee extension    Ankle dorsiflexion    Ankle plantarflexion    Ankle inversion    Ankle eversion    Lumbar extension     (Blank rows = not tested)  LOWER EXTREMITY STRENGTH  MMT out of 5 Left/Right 08/09/2022   Hip flexion 5-/5   Hip extension     Hip abduction 3/5   Hip adduction    Hip internal rotation    Hip external rotation    Knee flexion    Knee extension 4+/5   Ankle dorsiflexion    Ankle plantarflexion    Ankle inversion    Ankle eversion     (Blank rows = not tested)  GAIT: Distance walked: In the office (100 feet) Assistive device utilized: None Level of assistance: Complete Independence Comments: Cadence notes his symptoms are worst later in his work shift (with walking and WB) and at night   TODAY'S TREATMENT:                                                                                                                              DATE: 08/09/2022 Gluteal stretch 2 x 20 seconds Figure 4 stretch 2 x 20 seconds Seated straight leg raises 5 x each side Alternating hip hike at counter top 10 x 3 seconds    PATIENT EDUCATION:  Education details: Reviewed exam findings and HEP Person educated: Patient Education method: Explanation, Demonstration, Tactile cues, Verbal cues, and Handouts Education comprehension: verbalized understanding, returned demonstration, verbal cues required, tactile cues required, and needs further education  HOME EXERCISE PROGRAM: [id number]  ASSESSMENT:  CLINICAL IMPRESSION: Patient is a 32 y.o. male who was seen today for physical therapy evaluation and treatment for  Diagnosis  M79.605 (ICD-10-CM) - Pain in left leg  .  Stephen Lee's symptoms started soon after a job change where he went from a very stationary job to one that required walking 14-15,000 steps a day.  He notes his symptoms are worst with prolonged weightbearing and at night after he has been on his feet all day.  Stephen Lee has very tight hip rotators bilaterally, significant hip abductors weakness and some quadriceps and hip flexor weakness on the left side.  The focus of his physical therapy program will be on improving hip rotation active range of motion and primarily hip strength with special emphasis on hip  abductors.  We also discussed the importance of adequate rest and recovery time, although this will be difficult given his 7-day a week work schedule.  His prognosis is good to meet the below listed goals.  OBJECTIVE IMPAIRMENTS: Abnormal gait, decreased activity tolerance, decreased endurance, decreased knowledge of condition, difficulty walking, decreased ROM, decreased strength, impaired flexibility, and pain.  ACTIVITY LIMITATIONS: carrying, standing, sleeping, stairs, and locomotion level  PARTICIPATION LIMITATIONS: community activity and occupation  PERSONAL FACTORS: HIV, depression, attention deficit are also affecting patient's functional outcome.   REHAB POTENTIAL: Good  CLINICAL DECISION MAKING: Stable/uncomplicated  EVALUATION COMPLEXITY: Low   GOALS: Goals reviewed with patient? Yes  SHORT TERM GOALS: Target date: 09/06/2022 Stephen Lee will be independent with his day 1 home exercise program Baseline: Started 08/09/2022 Goal status: INITIAL  2.  Improve bilateral hip active range of motion for internal rotation to 10 degrees and external rotation to 40 degrees Baseline: 0/-3 and 19/26 left over right in degrees, respectively Goal status: INITIAL   LONG TERM GOALS: Target date: 10/04/2022  Improve left sided leg pain to consistently 0-3 out of 10 on the numeric pain rating scale Baseline: Can be as high as 9 out of 10 Goal status: INITIAL  2.  Improve FOTO to 70 Baseline: 38 Goal status: INITIAL  3.  Improve left hip strength to 5/5 MMT for hip flexion, knee extension and hip abduction Baseline: 3 out of 5 to 5- out of 5 (see objective) Goal status: INITIAL  4.  Stephen Lee will be able to complete his normal work activities without significant increases in pain or relying on pain medication Baseline: Pain can be as high as 9 out of 10 Goal status: INITIAL  5.  Stephen Lee will be independent with his long-term maintenance exercise program at discharge Baseline:  Started 08/09/2022 Goal status: INITIAL   PLAN:  PT FREQUENCY: 1x/week  PT DURATION: 8 weeks  PLANNED INTERVENTIONS: Therapeutic exercises, Therapeutic activity, Neuromuscular re-education, Balance training, Gait training, Patient/Family education, Self Care, Stair training, Dry Needling, Cryotherapy, and Vasopneumatic device  PLAN FOR NEXT SESSION: Review HEP.  Progress (in particular) quadriceps and hip abductors strength while avoiding overuse and encourage him to avoid overuse at work.   Cherlyn Cushing, PT, MPT 08/09/2022, 1:38 PM    PHYSICAL THERAPY DISCHARGE SUMMARY  Visits from Start of Care: 1  Current functional level related to goals / functional outcomes: See note   Remaining deficits: See note   Education / Equipment: HEP  Patient goals were not met. Patient is being discharged due to not returning since the last visit.  Chyrel Masson, PT, DPT, OCS, ATC 10/01/22  11:35 AM

## 2022-08-22 ENCOUNTER — Encounter: Payer: Managed Care, Other (non HMO) | Admitting: Physical Therapy

## 2022-08-22 ENCOUNTER — Telehealth: Payer: Self-pay | Admitting: Physical Therapy

## 2022-08-22 NOTE — Telephone Encounter (Signed)
No-show for today's appointment. Called and [facility] regarding no-show and time/date of next PT visit.    Nedra Hai, PT, DPT 08/22/22 2:04 PM

## 2022-08-23 ENCOUNTER — Ambulatory Visit: Payer: Managed Care, Other (non HMO) | Admitting: Physician Assistant

## 2022-08-29 ENCOUNTER — Encounter: Payer: Managed Care, Other (non HMO) | Admitting: Rehabilitative and Restorative Service Providers"

## 2022-08-29 ENCOUNTER — Telehealth: Payer: Self-pay | Admitting: Rehabilitative and Restorative Service Providers"

## 2022-08-29 NOTE — Telephone Encounter (Signed)
Left a VM referencing 2nd straight no show.  Said I needed to cancel the remaining 2 appointments but he can call to reschedule if he would like to continue.  Will DC at the end of July if no follow-ups attended.  Left [phone] number to call to schedule appointments at times he can attend.

## 2022-09-05 ENCOUNTER — Encounter: Payer: Managed Care, Other (non HMO) | Admitting: Rehabilitative and Restorative Service Providers"

## 2022-09-12 ENCOUNTER — Encounter: Payer: Managed Care, Other (non HMO) | Admitting: Rehabilitative and Restorative Service Providers"

## 2022-09-13 ENCOUNTER — Other Ambulatory Visit: Payer: Self-pay | Admitting: Family

## 2022-09-13 DIAGNOSIS — B20 Human immunodeficiency virus [HIV] disease: Secondary | ICD-10-CM

## 2022-09-24 ENCOUNTER — Other Ambulatory Visit: Payer: Self-pay | Admitting: Family

## 2022-09-24 NOTE — Telephone Encounter (Signed)
Bactrim discontinued by provider 02/2022 - CD4 count over 200.  Sandie Ano, RN

## 2022-10-24 ENCOUNTER — Telehealth: Payer: Self-pay

## 2022-10-24 ENCOUNTER — Other Ambulatory Visit: Payer: Self-pay | Admitting: Family

## 2022-10-24 DIAGNOSIS — B20 Human immunodeficiency virus [HIV] disease: Secondary | ICD-10-CM

## 2022-10-24 NOTE — Telephone Encounter (Signed)
Called patient in regards to scheduling a f/u with Marcos Eke, no answer, and left a VM.

## 2022-11-16 ENCOUNTER — Ambulatory Visit: Payer: Managed Care, Other (non HMO) | Admitting: Family

## 2022-11-21 ENCOUNTER — Ambulatory Visit: Payer: Managed Care, Other (non HMO) | Admitting: Family

## 2022-11-22 ENCOUNTER — Encounter: Payer: Self-pay | Admitting: Family

## 2022-11-22 ENCOUNTER — Other Ambulatory Visit (HOSPITAL_COMMUNITY)
Admission: RE | Admit: 2022-11-22 | Discharge: 2022-11-22 | Disposition: A | Discharge status: Home / Self Care | Payer: Managed Care, Other (non HMO) | Source: Ambulatory Visit | Attending: Family | Admitting: Family

## 2022-11-22 ENCOUNTER — Other Ambulatory Visit: Payer: Self-pay

## 2022-11-22 ENCOUNTER — Ambulatory Visit (INDEPENDENT_AMBULATORY_CARE_PROVIDER_SITE_OTHER): Payer: Managed Care, Other (non HMO) | Admitting: Family

## 2022-11-22 VITALS — BP 143/90 | HR 91 | Temp 97.7°F | Ht 71.0 in | Wt 185.0 lb

## 2022-11-22 DIAGNOSIS — A539 Syphilis, unspecified: Secondary | ICD-10-CM | POA: Diagnosis not present

## 2022-11-22 DIAGNOSIS — Z21 Asymptomatic human immunodeficiency virus [HIV] infection status: Secondary | ICD-10-CM

## 2022-11-22 DIAGNOSIS — Z113 Encounter for screening for infections with a predominantly sexual mode of transmission: Secondary | ICD-10-CM

## 2022-11-22 DIAGNOSIS — B20 Human immunodeficiency virus [HIV] disease: Secondary | ICD-10-CM

## 2022-11-22 DIAGNOSIS — K047 Periapical abscess without sinus: Secondary | ICD-10-CM | POA: Diagnosis not present

## 2022-11-22 DIAGNOSIS — Z0000 Encounter for general adult medical examination without abnormal findings: Secondary | ICD-10-CM

## 2022-11-22 MED ORDER — AMOXICILLIN-POT CLAVULANATE 875-125 MG PO TABS
1.0000 | ORAL_TABLET | Freq: Two times a day (BID) | ORAL | 0 refills | Status: DC
Start: 2022-11-22 — End: 2023-03-21

## 2022-11-22 MED ORDER — BIKTARVY 50-200-25 MG PO TABS
1.0000 | ORAL_TABLET | Freq: Every day | ORAL | 5 refills | Status: DC
Start: 2022-11-22 — End: 2023-02-18

## 2022-11-22 NOTE — Assessment & Plan Note (Signed)
Stephen Lee has a suspected dental infection and is currently under the care of dentistry and possible need for oral surgery.  Start Augmentin.  Follow-up with dentistry as planned.

## 2022-11-22 NOTE — Assessment & Plan Note (Signed)
Discussed importance of safe sexual practice and condom use. Condoms and STD testing offered.  Reviewed vaccinations which were declined today and will likely get flu/COVID vaccines in the near future. Routine dental care up-to-date per recommendations.

## 2022-11-22 NOTE — Assessment & Plan Note (Signed)
Syphilis titer down to 1: 2 from previous treatment level of 1: 8 in June 2023.  Discussed RPR and projected downward trend.  Check RPR.

## 2022-11-22 NOTE — Assessment & Plan Note (Signed)
Mr. Borum continues to have well-controlled virus with good adherence and tolerance to [employer].  Reviewed lab work and discussed plan of care and U equals U.  Check blood work.  Continue current dose of Biktarvy.  Plan for follow-up in 6 months or sooner if needed with lab work on the same day.

## 2022-11-22 NOTE — Patient Instructions (Addendum)
Nice to see you. ? ?We will check your lab work today. ? ?Continue to take your medication daily as prescribed. ? ?Refills have been sent to the pharmacy. ? ?Plan for follow up in 6 months or sooner if needed with lab work on the same day. ? ?Have a great day and stay safe! ? ?

## 2022-11-22 NOTE — Progress Notes (Signed)
Brief Narrative   Patient ID: Stephen Lee, male    DOB: 1990-05-18, 32 y.o.   MRN: [id number]  [name] is a 32 y/o AA male with HIV disease diagnosed on 07/19/21 with risk factor of MSM. Initial viral load was 580,000 with CD4 count of 115. Initial Genotype with novel mutations V35V/I, V60I, R83K, Q102K, D123E, C162S, V179D, G196E, T200I, R211K, V245Q, A272S, R277R/K, V292I conferring potential low level resistance to NNRTI's efavirenz, etravirine, nevirapine, and rilpivirine. 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 Was co-infected with syphilis at the time of diagnosis (titer 1:8). Treatment naive. Started on [place] on entry to care.    Subjective:    Chief Complaint  Patient presents with   Follow-up   HIV Positive/AIDS    HPI:  Stephen Lee is a 32 y.o. male with HIV disease last seen on 05/02/22 with well controlled virus and good adherence and tolerance to [place]. Viral load was undetectable and CD4 count 385. Kidney function, liver function and electrolytes within normal ranges. RPR titer was down to 1:2 from previous treatment of 1:8 in 07/21/21.  Here today for routine follow up.   Stephen Lee has been doing well since his last office visit and continues to take [place] as prescribed with no adverse side effects or problems obtaining medication from the pharmacy which are currently mailed.  Has concern for possible dental infection located on his right lower teeth and has been seen by dentistry with plans for follow-up with oral surgery.  Working full-time.  Condoms and STD testing offered.  Reviewed vaccinations.  Denies fevers, chills, night sweats, headaches, changes in vision, neck pain/stiffness, nausea, diarrhea, vomiting, lesions or rashes.  Lab Results  Component Value Date   CD4TCELL 22 (L) 05/02/2022   CD4TABS 385 (L) 05/02/2022   Lab Results  Component Value Date   HIV1RNAQUANT 28 (H) 05/02/2022     No Known Allergies    Outpatient  Medications Prior to Visit  Medication Sig Dispense Refill   meloxicam (MOBIC) 15 MG tablet TAKE 1 TABLET(15 MG) BY MOUTH DAILY 30 tablet 0   BIKTARVY 50-200-25 MG TABS tablet TAKE 1 TABLET BY MOUTH DAILY 30 tablet 0   methocarbamol (ROBAXIN) 500 MG tablet Take 1 tablet (500 mg total) by mouth every 6 (six) hours as needed for muscle spasms. (Patient not taking: Reported on 11/22/2022) 30 tablet 0   sulfamethoxazole-trimethoprim (BACTRIM DS) 800-160 MG tablet Take 1 tablet by mouth daily. (Patient not taking: Reported on 11/22/2022)     No facility-administered medications prior to visit.     Past Medical History:  Diagnosis Date   Attention deficit    Condyloma acuminatum in male    perianal & anal canal   Depression    HIV (human immunodeficiency virus infection) (HCC)      No past surgical history on file.    Review of Systems  Constitutional:  Negative for appetite change, chills, fatigue, fever and unexpected weight change.  Eyes:  Negative for visual disturbance.  Respiratory:  Negative for cough, chest tightness, shortness of breath and wheezing.   Cardiovascular:  Negative for chest pain and leg swelling.  Gastrointestinal:  Negative for abdominal pain, constipation, diarrhea, nausea and vomiting.  Genitourinary:  Negative for dysuria, flank pain, frequency, genital sores, hematuria and urgency.  Skin:  Negative for rash.  Allergic/Immunologic: Negative for immunocompromised state.  Neurological:  Negative for dizziness and headaches.      Objective:    BP Marland Kitchen)  143/90   Pulse 91   Temp 97.7 F (36.5 C) (Temporal)   Ht 5\' 11"  (1.803 m)   Wt 185 lb (83.9 kg)   SpO2 97%   BMI 25.80 kg/m  Nursing note and vital signs reviewed.  Physical Exam Constitutional:      General: He is not in acute distress.    Appearance: He is well-developed.  Eyes:     Conjunctiva/sclera: Conjunctivae normal.  Cardiovascular:     Rate and Rhythm: Normal rate and regular rhythm.      Heart sounds: Normal heart sounds. No murmur heard.    No friction rub. No gallop.  Pulmonary:     Effort: Pulmonary effort is normal. No respiratory distress.     Breath sounds: Normal breath sounds. No wheezing or rales.  Chest:     Chest wall: No tenderness.  Abdominal:     General: Bowel sounds are normal.     Palpations: Abdomen is soft.     Tenderness: There is no abdominal tenderness.  Musculoskeletal:     Cervical back: Neck supple.  Lymphadenopathy:     Cervical: No cervical adenopathy.  Skin:    General: Skin is warm and dry.     Findings: No rash.  Neurological:     Mental Status: He is alert and oriented to person, place, and time.  Psychiatric:        Behavior: Behavior normal.        Thought Content: Thought content normal.        Judgment: Judgment normal.         11/22/2022    9:02 AM 05/02/2022    1:44 PM 01/16/2022    8:49 AM 12/01/2021   10:23 AM 09/22/2021   11:20 AM  Depression screen PHQ 2/9  Decreased Interest 0 0 0 0 0  Down, Depressed, Hopeless 0 0 0 0 0  PHQ - 2 Score 0 0 0 0 0       Assessment & Plan:    Patient Active Problem List   Diagnosis Date Noted   Dental infection 11/22/2022   Need for emotional support 05/02/2022   Rash and nonspecific skin eruption 12/01/2021   Tinea corporis 12/01/2021   Syphilis 08/21/2021   Healthcare maintenance 08/21/2021   Therapeutic drug monitoring 08/21/2021   AIDS (HCC) 07/22/2021   Hypokalemia 07/19/2021   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 has a suspected dental infection and is currently under the care of dentistry and possible need for oral surgery.  Start Augmentin.  Follow-up with dentistry as planned.      Relevant Medications   bictegravir-emtricitabine-tenofovir AF  (BIKTARVY) 50-200-25 MG TABS tablet   amoxicillin-clavulanate (AUGMENTIN) 875-125 MG tablet     Other   HIV (human immunodeficiency virus infection) (HCC) - Primary    [name] continues to have well-controlled virus with good adherence and tolerance to [employer].  Reviewed lab work and discussed plan of care and U equals U.  Check blood work.  Continue current dose of Biktarvy.  Plan for follow-up in 6 months or sooner if needed with lab work on the same day.      Relevant Medications   bictegravir-emtricitabine-tenofovir AF (BIKTARVY) 50-200-25 MG TABS tablet   Other Relevant Orders  COMPLETE METABOLIC PANEL WITH GFR   HIV-1 RNA quant-no reflex-bld   T-helper cell (CD4)- ([facility] clinic only)   Syphilis    Syphilis titer down to 1: 2 from previous treatment level of 1: 8 in June 2023.  Discussed RPR and projected downward trend.  Check RPR.      Relevant Medications   bictegravir-emtricitabine-tenofovir AF (BIKTARVY) 50-200-25 MG TABS tablet   Other Relevant Orders   RPR   Healthcare maintenance    Discussed importance of safe sexual practice and condom use. Condoms and STD testing offered.  Reviewed vaccinations which were declined today and will likely get flu/COVID vaccines in the near future. Routine dental care up-to-date per recommendations.      Other Visit Diagnoses     Screening for STDs (sexually transmitted diseases)       Relevant Orders   Urine cytology ancillary only   Cytology (oral, anal, urethral) ancillary only   Cytology (oral, anal, urethral) ancillary only        I have discontinued Stephen Lee's sulfamethoxazole-trimethoprim and methocarbamol. I have also changed his Biktarvy. Additionally, I am having him start on amoxicillin-clavulanate. Lastly, I am having him maintain his meloxicam.   Meds ordered this encounter  Medications   bictegravir-emtricitabine-tenofovir AF (BIKTARVY) 50-200-25 MG TABS tablet    Sig: Take 1 tablet by mouth daily.     Dispense:  30 tablet    Refill:  5    Order Specific Question:   Supervising Provider    Answer:   Judyann Munson [phone]    Order Specific Question:   Prescription Type:    Answer:   Renewal   amoxicillin-clavulanate (AUGMENTIN) 875-125 MG tablet    Sig: Take 1 tablet by mouth 2 (two) times daily.    Dispense:  20 tablet    Refill:  0    Order Specific Question:   Supervising Provider    Answer:   Judyann Munson [[id number]]     Follow-up: Return in about 6 months (around 05/23/2023), or if symptoms worsen or fail to improve. or sooner if needed.    Marcos Eke, MSN, FNP-C Nurse Practitioner [facility] Main number: [phone]

## 2022-11-23 LAB — CYTOLOGY, (ORAL, ANAL, URETHRAL) ANCILLARY ONLY
Chlamydia: NEGATIVE
Chlamydia: POSITIVE — AB
Comment: NEGATIVE
Comment: NEGATIVE
Comment: NORMAL
Comment: NORMAL
Neisseria Gonorrhea: NEGATIVE
Neisseria Gonorrhea: NEGATIVE

## 2022-11-23 LAB — URINE CYTOLOGY ANCILLARY ONLY
Chlamydia: NEGATIVE
Comment: NEGATIVE
Comment: NORMAL
Neisseria Gonorrhea: NEGATIVE

## 2022-11-23 LAB — T-HELPER CELL (CD4) - (RCID CLINIC ONLY)
CD4 % Helper T Cell: 24 % — ABNORMAL LOW (ref 33–65)
CD4 T Cell Abs: 474 /uL (ref 400–1790)

## 2022-11-23 MED ORDER — DOXYCYCLINE HYCLATE 100 MG PO TABS: 100.0000 mg | ORAL_TABLET | Freq: Two times a day (BID) | ORAL | 0 refills | Status: DC

## 2022-11-23 NOTE — Addendum Note (Signed)
Addended by: Jeanine Luz D on: 11/23/2022 10:48 AM   Modules accepted: Orders

## 2022-11-25 LAB — T PALLIDUM AB: T Pallidum Abs: POSITIVE — AB

## 2022-11-25 LAB — COMPLETE METABOLIC PANEL WITH GFR
AG Ratio: 1.2 (calc) (ref 1.0–2.5)
ALT: 9 U/L (ref 9–46)
AST: 20 U/L (ref 10–40)
Albumin: 4.3 g/dL (ref 3.6–5.1)
Alkaline phosphatase (APISO): 101 U/L (ref 36–130)
BUN: 13 mg/dL (ref 7–25)
CO2: 28 mmol/L (ref 20–32)
Calcium: 9.5 mg/dL (ref 8.6–10.3)
Chloride: 104 mmol/L (ref 98–110)
Creat: 1.13 mg/dL (ref 0.60–1.26)
Globulin: 3.6 g/dL (ref 1.9–3.7)
Glucose, Bld: 84 mg/dL (ref 65–99)
Potassium: 3.8 mmol/L (ref 3.5–5.3)
Sodium: 141 mmol/L (ref 135–146)
Total Bilirubin: 0.4 mg/dL (ref 0.2–1.2)
Total Protein: 7.9 g/dL (ref 6.1–8.1)
eGFR: 88 mL/min/{1.73_m2} (ref >=60)

## 2022-11-25 LAB — HIV-1 RNA QUANT-NO REFLEX-BLD
HIV 1 RNA Quant: NOT DETECTED {copies}/mL
HIV-1 RNA Quant, Log: NOT DETECTED {Log_copies}/mL

## 2022-11-25 LAB — RPR TITER: RPR Titer: 1:2 {titer} — ABNORMAL HIGH

## 2022-11-25 LAB — RPR: RPR Ser Ql: REACTIVE — AB

## 2023-01-01 ENCOUNTER — Other Ambulatory Visit: Payer: Self-pay | Admitting: Family

## 2023-01-01 DIAGNOSIS — Z21 Asymptomatic human immunodeficiency virus [HIV] infection status: Secondary | ICD-10-CM

## 2023-01-07 ENCOUNTER — Ambulatory Visit: Payer: Managed Care, Other (non HMO) | Admitting: Family

## 2023-01-21 ENCOUNTER — Telehealth: Payer: Self-pay

## 2023-01-21 NOTE — Telephone Encounter (Signed)
Received call from [facility]. Patient just arrived there, they are going to check and see if patient has a supply of Biktarvy on hand. If not, they will give [place] a call and request that it be sent to [facility] in [place].   Sandie Ano, RN

## 2023-02-18 ENCOUNTER — Other Ambulatory Visit: Payer: Self-pay

## 2023-02-18 DIAGNOSIS — Z21 Asymptomatic human immunodeficiency virus [HIV] infection status: Secondary | ICD-10-CM

## 2023-02-18 MED ORDER — BIKTARVY 50-200-25 MG PO TABS
1.0000 | ORAL_TABLET | Freq: Every day | ORAL | 5 refills | Status: AC
Start: 2023-02-18 — End: 2023-08-17

## 2023-03-21 ENCOUNTER — Ambulatory Visit (INDEPENDENT_AMBULATORY_CARE_PROVIDER_SITE_OTHER): Payer: 59 | Admitting: Orthopedic Surgery

## 2023-03-21 ENCOUNTER — Encounter: Payer: Self-pay | Admitting: Orthopedic Surgery

## 2023-03-21 VITALS — BP 118/80 | HR 85 | Temp 97.4°F | Resp 16 | Ht 71.0 in | Wt 182.6 lb

## 2023-03-21 DIAGNOSIS — Z21 Asymptomatic human immunodeficiency virus [HIV] infection status: Secondary | ICD-10-CM | POA: Diagnosis not present

## 2023-03-21 DIAGNOSIS — M87051 Idiopathic aseptic necrosis of right femur: Secondary | ICD-10-CM

## 2023-03-21 NOTE — Progress Notes (Signed)
 Careteam: Patient Care Team: Gil Greig BRAVO, NP as PCP - General (Adult Health Nurse Practitioner)  Seen by: Greig Gil, AGNP-C  PLACE OF SERVICE:  [facility]  Advanced Directive information    No Known Allergies  Chief Complaint  Patient presents with   Establish Care    New patient      HPI: Patient is a 33 y.o. male seen today to establish at [facility].   Discussed the use of AI scribe software for clinical note transcription with the patient, who gave verbal consent to proceed.  History of Present Illness   Stephen Lee is a 33 year old male with HIV and avascular necrosis who presents for surgical clearance for hip replacement.  No previous provider. 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 College graduate> [employer].   He has a history of HIV infection, diagnosed in June 2023, and is currently on Biktarvy  with no side effects. He follows up with his infectious disease specialist every six months for monitoring of his viral load and kidney function. Sexual preference males. He also undergoes regular STI testing during these visits.  He is scheduled for hip replacement surgery with Dr. Liam due to avascular necrosis, diagnosed after experiencing significant unresolved hip pain. The pain is severe, impacting his ability to walk. Initial evaluations included an x-ray of his back and an ultrasound of his legs, which did not identify the issue. Eventually, x-rays and an MRI confirmed avascular necrosis. The pain is intermittent but severe, affecting his daily activities and work.  No past surgical history. No past hospitalizations.   He has no history of smoking or drug use and drinks alcohol socially.   Eats 2 meals daily. Occasional soda a few times weekly. Does not follow a particular diet.   Dental and eye exam done within past 2 years.      Review of Systems:  Review of Systems  Constitutional: Negative.   HENT: Negative.     Eyes: Negative.   Respiratory: Negative.    Cardiovascular: Negative.   Gastrointestinal: Negative.   Genitourinary: Negative.   Musculoskeletal:  Positive for joint pain.  Skin: Negative.   Neurological: Negative.   Psychiatric/Behavioral: Negative.      Past Medical History:  Diagnosis Date   Attention deficit    Condyloma acuminatum in male    perianal & anal canal   Depression    HIV (human immunodeficiency virus infection) (HCC)    History reviewed. No pertinent surgical history. Social History:   reports that he has never smoked. He has never used smokeless tobacco. He reports that he does not currently use alcohol. He reports that he does not use drugs.  History reviewed. No pertinent family history.  Medications: Patient's Medications  New Prescriptions   No medications on file  Previous Medications   BICTEGRAVIR-EMTRICITABINE -TENOFOVIR  AF (BIKTARVY ) 50-200-25 MG TABS TABLET    Take 1 tablet by mouth daily.  Modified Medications   No medications on file  Discontinued Medications   AMOXICILLIN -CLAVULANATE (AUGMENTIN ) 875-125 MG TABLET    Take 1 tablet by mouth 2 (two) times daily.   DOXYCYCLINE  (VIBRA -TABS) 100 MG TABLET    Take 1 tablet (100 mg total) by mouth 2 (two) times daily.   MELOXICAM  (MOBIC ) 15 MG TABLET    TAKE 1 TABLET(15 MG) BY MOUTH DAILY    Physical Exam:  Vitals:   03/21/23 1320  BP: 118/80  Pulse: 85  Resp: 16  Temp: (!) 97.4 F (  36.3 C)  SpO2: 95%  Weight: 182 lb 9.6 oz (82.8 kg)  Height: 5' 11 (1.803 m)   Body mass index is 25.47 kg/m. Wt Readings from Last 3 Encounters:  03/21/23 182 lb 9.6 oz (82.8 kg)  11/22/22 185 lb (83.9 kg)  06/07/22 180 lb (81.6 kg)    Physical Exam Vitals reviewed.  Constitutional:      General: He is not in acute distress. HENT:     Head: Normocephalic.  Eyes:     General:        Right eye: No discharge.        Left eye: No discharge.  Cardiovascular:     Rate and Rhythm: Normal rate and  regular rhythm.     Pulses: Normal pulses.     Heart sounds: Normal heart sounds.  Pulmonary:     Effort: Pulmonary effort is normal.     Breath sounds: Normal breath sounds.  Abdominal:     General: Bowel sounds are normal.     Palpations: Abdomen is soft.  Musculoskeletal:     Cervical back: Neck supple.     Right lower leg: No edema.     Left lower leg: No edema.  Skin:    General: Skin is warm.     Capillary Refill: Capillary refill takes less than 2 seconds.  Neurological:     General: No focal deficit present.     Mental Status: He is alert and oriented to person, place, and time.  Psychiatric:        Mood and Affect: Mood normal.     Labs reviewed: Basic Metabolic Panel: Recent Labs    05/02/22 1538 11/22/22 0929  NA 143 141  K 3.8 3.8  CL 105 104  CO2 26 28  GLUCOSE 82 84  BUN 11 13  CREATININE 1.21 1.13  CALCIUM 9.8 9.5   Liver Function Tests: Recent Labs    05/02/22 1538 11/22/22 0929  AST 35 20  ALT 18 9  BILITOT 0.3 0.4  PROT 8.0 7.9   No results for input(s): LIPASE, AMYLASE in the last 8760 hours. No results for input(s): AMMONIA in the last 8760 hours. CBC: No results for input(s): WBC, NEUTROABS, HGB, HCT, MCV, PLT in the last 8760 hours. Lipid Panel: No results for input(s): CHOL, HDL, LDLCALC, TRIG, CHOLHDL, LDLDIRECT in the last 8760 hours. TSH: No results for input(s): TSH in the last 8760 hours. A1C: No results found for: HGBA1C   Assessment/Plan 1. Asymptomatic HIV infection (HCC) (Primary) - followed by ID - CD4 count 474 - cont Biktarvy   2. Avascular necrosis of bone of right hip (HCC) - ongoing right hip pain past few months - PT did not help - Past work up of low back pain a,f US  right lower leg unremarkable - followed by Dr. Diania upcoming RATH - plans to schedule medical clearance soon> cbc/diff & bmp, EKG  Total time: 32 minutes. Greater than 50% of total time spent doing patient  education regarding health maintenance, HIV and right hip pain including symptom/medication management.    Next appt: 04/18/2023  Greig Cluster, ELNITA  [facility] & Adult Medicine [phone]

## 2023-04-18 ENCOUNTER — Encounter: Payer: Self-pay | Admitting: Orthopedic Surgery

## 2023-04-18 ENCOUNTER — Encounter: Payer: 59 | Admitting: Orthopedic Surgery

## 2023-04-18 ENCOUNTER — Ambulatory Visit (INDEPENDENT_AMBULATORY_CARE_PROVIDER_SITE_OTHER): Admitting: Orthopedic Surgery

## 2023-04-18 VITALS — BP 110/80 | HR 75 | Temp 97.5°F | Resp 98 | Ht 71.0 in | Wt 161.4 lb

## 2023-04-18 DIAGNOSIS — Z01818 Encounter for other preprocedural examination: Secondary | ICD-10-CM

## 2023-04-18 DIAGNOSIS — Z21 Asymptomatic human immunodeficiency virus [HIV] infection status: Secondary | ICD-10-CM | POA: Diagnosis not present

## 2023-04-18 DIAGNOSIS — M87051 Idiopathic aseptic necrosis of right femur: Secondary | ICD-10-CM

## 2023-04-18 NOTE — Progress Notes (Signed)
 Careteam: Patient Care Team: Stephen Heir, NP as PCP - General (Adult Health Nurse Practitioner)  Seen by: Hazle Nordmann, AGNP-C  PLACE OF SERVICE:  [facility] CLINIC  Advanced Directive information Does Patient Have a Medical Advance Directive?: No, Would patient like information on creating a medical advance directive?: No - Patient declined  No Known Allergies  Chief Complaint  Patient presents with   Form Completion    Medical clearance for work 4 week follow up.      HPI: Patient is a 33 y.o. male seen today for preoperative clearance.   Followed by Dr. [name]. He plans to have upcoming RATH due to avascular necrosis due to ongoing right hip pain. Denies increased pain today. Continues to ambulate on his own. In house EKG> NSR. Lab work drawn today. Denies chest pain, shortness of breath. Today's blood pressure 110/80.   Followed by infectious disease for HIV. Remains on Biktarvy.   Review of Systems:  Review of Systems  Constitutional: Negative.   HENT: Negative.    Eyes: Negative.   Respiratory: Negative.    Cardiovascular: Negative.   Gastrointestinal: Negative.   Genitourinary: Negative.   Musculoskeletal:  Positive for joint pain.  Skin: Negative.   Neurological: Negative.   Psychiatric/Behavioral: Negative.      Past Medical History:  Diagnosis Date   Attention deficit    Condyloma acuminatum in male    perianal & anal canal   Depression    HIV (human immunodeficiency virus infection) (HCC)    History reviewed. No pertinent surgical history. Social History:   reports that he has never smoked. He has never used smokeless tobacco. He reports that he does not currently use alcohol. He reports that he does not use drugs.  History reviewed. No pertinent family history.  Medications: Patient's Medications  New Prescriptions   No medications on file  Previous Medications   BICTEGRAVIR-EMTRICITABINE-TENOFOVIR AF (BIKTARVY) 50-200-25 MG TABS TABLET    Take 1  tablet by mouth daily.  Modified Medications   No medications on file  Discontinued Medications   No medications on file    Physical Exam:  Vitals:   04/18/23 1445  BP: 110/80  Pulse: 75  Resp: (!) 98  Temp: (!) 97.5 F (36.4 C)  Weight: 161 lb 6.4 oz (73.2 kg)  Height: 5\' 11"  (1.803 m)  HC: 16" (40.6 cm)   Body mass index is 22.51 kg/m. Wt Readings from Last 3 Encounters:  04/18/23 161 lb 6.4 oz (73.2 kg)  03/21/23 182 lb 9.6 oz (82.8 kg)  11/22/22 185 lb (83.9 kg)    Physical Exam Vitals reviewed.  Constitutional:      General: He is not in acute distress. HENT:     Head: Normocephalic.     Nose: Nose normal.     Mouth/Throat:     Mouth: Mucous membranes are moist.  Eyes:     General:        Right eye: No discharge.        Left eye: No discharge.  Cardiovascular:     Rate and Rhythm: Normal rate and regular rhythm.     Pulses: Normal pulses.     Heart sounds: Normal heart sounds.  Pulmonary:     Effort: Pulmonary effort is normal.     Breath sounds: Normal breath sounds.  Abdominal:     General: Bowel sounds are normal.     Palpations: Abdomen is soft.  Musculoskeletal:     Cervical back: Neck  supple.     Right lower leg: No edema.     Left lower leg: No edema.  Skin:    General: Skin is warm.     Capillary Refill: Capillary refill takes less than 2 seconds.  Neurological:     General: No focal deficit present.     Mental Status: He is alert and oriented to person, place, and time.  Psychiatric:        Mood and Affect: Mood normal.     Labs reviewed: Basic Metabolic Panel: Recent Labs    05/02/22 1538 11/22/22 0929  NA 143 141  K 3.8 3.8  CL 105 104  CO2 26 28  GLUCOSE 82 84  BUN 11 13  CREATININE 1.21 1.13  CALCIUM 9.8 9.5   Liver Function Tests: Recent Labs    05/02/22 1538 11/22/22 0929  AST 35 20  ALT 18 9  BILITOT 0.3 0.4  PROT 8.0 7.9   No results for input(s): "LIPASE", "AMYLASE" in the last 8760 hours. No results  for input(s): "AMMONIA" in the last 8760 hours. CBC: No results for input(s): "WBC", "NEUTROABS", "HGB", "HCT", "MCV", "PLT" in the last 8760 hours. Lipid Panel: No results for input(s): "CHOL", "HDL", "LDLCALC", "TRIG", "CHOLHDL", "LDLDIRECT" in the last 8760 hours. TSH: No results for input(s): "TSH" in the last 8760 hours. A1C: No results found for: "HGBA1C"   Assessment/Plan 1. Avascular necrosis of bone of right hip (HCC) (Primary) - followed by Dr. [name]  - ongoing right hip pain caused by AV necrosis - awaiting to schedule upcoming RATH - denies pain today - ambulates without device  2. Preoperative clearance - see above - BP at goal - EKG 12-Lead> NSR - low cardiac risk - CBC with Differential/Platelet>pending - Complete Metabolic Panel with eGFR> pending - Hemoglobin A1c> pending  3. Asymptomatic HIV infection (HCC) - followed by I/D - cont Biktarvy  Total time: 31 minutes. Greater than 50% of total time spent doing patient education regarding AV necrosis, right hip pain, perioperative surgical clearance, HIV including symptom/medication management.    Next appt: Visit date not found  Raider Valbuena Scherry Ran  [facility] & Adult Medicine [phone]

## 2023-04-19 DIAGNOSIS — Z0279 Encounter for issue of other medical certificate: Secondary | ICD-10-CM

## 2023-04-19 LAB — CBC WITH DIFFERENTIAL/PLATELET
Absolute Lymphocytes: 3149 {cells}/uL (ref 850–3900)
Absolute Monocytes: 637 {cells}/uL (ref 200–950)
Basophils Absolute: 27 {cells}/uL (ref 0–200)
Basophils Relative: 0.4 %
Eosinophils Absolute: 147 {cells}/uL (ref 15–500)
Eosinophils Relative: 2.2 %
HCT: 44.7 % (ref 38.5–50.0)
Hemoglobin: 15.1 g/dL (ref 13.2–17.1)
MCH: 29.7 pg (ref 27.0–33.0)
MCHC: 33.8 g/dL (ref 32.0–36.0)
MCV: 88 fL (ref 80.0–100.0)
MPV: 10.6 fL (ref 7.5–12.5)
Monocytes Relative: 9.5 %
Neutro Abs: 2740 {cells}/uL (ref 1500–7800)
Neutrophils Relative %: 40.9 %
Platelets: 300 10*3/uL (ref 140–400)
RBC: 5.08 10*6/uL (ref 4.20–5.80)
RDW: 13.2 % (ref 11.0–15.0)
Total Lymphocyte: 47 %
WBC: 6.7 10*3/uL (ref 3.8–10.8)

## 2023-04-19 LAB — COMPLETE METABOLIC PANEL WITH GFR
AG Ratio: 1.3 (calc) (ref 1.0–2.5)
ALT: 7 U/L — ABNORMAL LOW (ref 9–46)
AST: 18 U/L (ref 10–40)
Albumin: 4.7 g/dL (ref 3.6–5.1)
Alkaline phosphatase (APISO): 81 U/L (ref 36–130)
BUN: 12 mg/dL (ref 7–25)
CO2: 29 mmol/L (ref 20–32)
Calcium: 9.9 mg/dL (ref 8.6–10.3)
Chloride: 103 mmol/L (ref 98–110)
Creat: 1.05 mg/dL (ref 0.60–1.26)
Globulin: 3.6 g/dL (ref 1.9–3.7)
Glucose, Bld: 94 mg/dL (ref 65–139)
Potassium: 4.1 mmol/L (ref 3.5–5.3)
Sodium: 139 mmol/L (ref 135–146)
Total Bilirubin: 0.4 mg/dL (ref 0.2–1.2)
Total Protein: 8.3 g/dL — ABNORMAL HIGH (ref 6.1–8.1)
eGFR: 96 mL/min/{1.73_m2} (ref >=60)

## 2023-04-19 LAB — HEMOGLOBIN A1C
Hgb A1c MFr Bld: 6 %{Hb} — ABNORMAL HIGH (ref <5.7)
Mean Plasma Glucose: 126 mg/dL
eAG (mmol/L): 7 mmol/L

## 2023-04-19 NOTE — Progress Notes (Signed)
 This encounter was created in error - please disregard.

## 2023-04-22 ENCOUNTER — Telehealth: Payer: Self-pay | Admitting: *Deleted

## 2023-04-22 NOTE — Telephone Encounter (Signed)
 I just faxed completed surgical clearance to [employer].

## 2023-04-22 NOTE — Telephone Encounter (Signed)
 Copied from CRM [id number]. Topic: General - Other >> Apr 22, 2023  2:58 PM Suzette B wrote: Reason for CRM: Ms Sharyl Nimrod of [facility] [phone] has called in reference to surgical clearance for the patient Right Total Hip Replacement. Please call Ms. Meredith to advise on status of clearance.    Patient was seen on 04/18/2023.  Forwarded to Hazle Nordmann, NP

## 2023-05-03 ENCOUNTER — Other Ambulatory Visit: Payer: Self-pay | Admitting: Orthopedic Surgery

## 2023-05-08 NOTE — Patient Instructions (Addendum)
 SURGICAL WAITING ROOM VISITATION  Patients having surgery or a procedure may have no more than 2 support people in the waiting area - these visitors may rotate.    Children under the age of 53 must have an adult with them who is not the patient.  Due to an increase in RSV and influenza rates and associated hospitalizations, children ages 49 and under may not visit patients in [facility] hospitals.  Visitors with respiratory illnesses are discouraged from visiting and should remain at home.  If the patient needs to stay at the hospital during part of their recovery, the visitor guidelines for inpatient rooms apply. Pre-op nurse will coordinate an appropriate time for 1 support person to accompany patient in pre-op.  This support person may not rotate.    Please refer to the [facility] website for the visitor guidelines for Inpatients (after your surgery is over and you are in a regular room).       Your procedure is scheduled on:  05/17/23    Report to [facility] Main Entrance    Report to admitting at   [phone]   Call this number if you have problems the morning of surgery [phone]   Do not eat food :After Midnight.   After Midnight you may have the following liquids until __0415____ AM DAY OF SURGERY  Water Non-Citrus Juices (without pulp, NO RED-Apple, White grape, White cranberry) Black Coffee (NO MILK/CREAM OR CREAMERS, sugar ok)  Clear Tea (NO MILK/CREAM OR CREAMERS, sugar ok) regular and decaf                             Plain Jell-O (NO RED)                                           Fruit ices (not with fruit pulp, NO RED)                                     Popsicles (NO RED)                                                               Sports drinks like Gatorade (NO RED)                      The day of surgery:  Drink ONE (1) Pre-Surgery Clear Ensure or G2 at  0415AM ( have completed by )  the morning of surgery. Drink in one sitting. Do not sip.   This drink was given to you during your hospital  pre-op appointment visit. Nothing else to drink after completing the  Pre-Surgery Clear Ensure or G2.          If you have questions, please contact your surgeon's office.      Oral Hygiene is also important to reduce your risk of infection.  Remember - BRUSH YOUR TEETH THE MORNING OF SURGERY WITH YOUR REGULAR TOOTHPASTE  DENTURES WILL BE REMOVED PRIOR TO SURGERY PLEASE DO NOT APPLY "Poly grip" OR ADHESIVES!!!   Do NOT smoke after Midnight   Stop all vitamins and herbal supplements 7 days before surgery.   Take these medicines the morning of surgery with A SIP OF WATER:  Biktarvy   DO NOT TAKE ANY ORAL DIABETIC MEDICATIONS DAY OF YOUR SURGERY  Bring CPAP mask and tubing day of surgery.                              You may not have any metal on your body including hair pins, jewelry, and body piercing             Do not wear make-up, lotions, powders, perfumes/cologne, or deodorant  Do not wear nail polish including gel and S&S, artificial/acrylic nails, or any other type of covering on natural nails including finger and toenails. If you have artificial nails, gel coating, etc. that needs to be removed by a nail salon please have this removed prior to surgery or surgery may need to be canceled/ delayed if the surgeon/ anesthesia feels like they are unable to be safely monitored.   Do not shave  48 hours prior to surgery.               Men may shave face and neck.   Do not bring valuables to the hospital. [place] IS NOT             RESPONSIBLE   FOR VALUABLES.   Contacts, glasses, dentures or bridgework may not be worn into surgery.   Bring small overnight bag day of surgery.   DO NOT BRING YOUR HOME MEDICATIONS TO THE HOSPITAL. PHARMACY WILL DISPENSE MEDICATIONS LISTED ON YOUR MEDICATION LIST TO YOU DURING YOUR ADMISSION IN THE HOSPITAL!    Patients discharged on the day of surgery  will not be allowed to drive home.  Someone NEEDS to stay with you for the first 24 hours after anesthesia.   Special Instructions: Bring a copy of your healthcare power of attorney and living will documents the day of surgery if you haven't scanned them before.              Please read over the following fact sheets you were given: IF YOU HAVE QUESTIONS ABOUT YOUR PRE-OP INSTRUCTIONS PLEASE CALL [phone]   If you received a COVID test during your pre-op visit  it is requested that you wear a mask when out in public, stay away from anyone that may not be feeling well and notify your surgeon if you develop symptoms. If you test positive for Covid or have been in contact with anyone that has tested positive in the last 10 days please notify you surgeon.      Pre-operative 5 CHG Bath Instructions   You can play a key role in reducing the risk of infection after surgery. Your skin needs to be as free of germs as possible. You can reduce the number of germs on your skin by washing with CHG (chlorhexidine gluconate) soap before surgery. CHG is an antiseptic soap that kills germs and continues to kill germs even after washing.   DO NOT use if you have an allergy to chlorhexidine/CHG or antibacterial soaps. If your skin becomes reddened or irritated, stop using the CHG and notify one of our RNs at  [phone].   Please shower with the CHG soap starting 4 days before surgery using the following schedule:     Please keep in mind the following:  DO NOT shave, including legs and underarms, starting the day of your first shower.   You may shave your face at any point before/day of surgery.  Place clean sheets on your bed the day you start using CHG soap. Use a clean washcloth (not used since being washed) for each shower. DO NOT sleep with pets once you start using the CHG.   CHG Shower Instructions:  If you choose to wash your hair and private area, wash first with your normal shampoo/soap.   After you use shampoo/soap, rinse your hair and body thoroughly to remove shampoo/soap residue.  Turn the water OFF and apply about 3 tablespoons (45 ml) of CHG soap to a CLEAN washcloth.  Apply CHG soap ONLY FROM YOUR NECK DOWN TO YOUR TOES (washing for 3-5 minutes)  DO NOT use CHG soap on face, private areas, open wounds, or sores.  Pay special attention to the area where your surgery is being performed.  If you are having back surgery, having someone wash your back for you may be helpful. Wait 2 minutes after CHG soap is applied, then you may rinse off the CHG soap.  Pat dry with a clean towel  Put on clean clothes/pajamas   If you choose to wear lotion, please use ONLY the CHG-compatible lotions on the back of this paper.     Additional instructions for the day of surgery: DO NOT APPLY any lotions, deodorants, cologne, or perfumes.   Put on clean/comfortable clothes.  Brush your teeth.  Ask your nurse before applying any prescription medications to the skin.      CHG Compatible Lotions   Aveeno Moisturizing lotion  Cetaphil Moisturizing Cream  Cetaphil Moisturizing Lotion  Clairol Herbal Essence Moisturizing Lotion, Dry Skin  Clairol Herbal Essence Moisturizing Lotion, Extra Dry Skin  Clairol Herbal Essence Moisturizing Lotion, Normal Skin  Curel Age Defying Therapeutic Moisturizing Lotion with Alpha Hydroxy  Curel Extreme Care Body Lotion  Curel Soothing Hands Moisturizing Hand Lotion  Curel Therapeutic Moisturizing Cream, Fragrance-Free  Curel Therapeutic Moisturizing Lotion, Fragrance-Free  Curel Therapeutic Moisturizing Lotion, Original Formula  Eucerin Daily Replenishing Lotion  Eucerin Dry Skin Therapy Plus Alpha Hydroxy Crme  Eucerin Dry Skin Therapy Plus Alpha Hydroxy Lotion  Eucerin Original Crme  Eucerin Original Lotion  Eucerin Plus Crme Eucerin Plus Lotion  Eucerin TriLipid Replenishing Lotion  Keri Anti-Bacterial Hand Lotion  Keri Deep Conditioning  Original Lotion Dry Skin Formula Softly Scented  Keri Deep Conditioning Original Lotion, Fragrance Free Sensitive Skin Formula  Keri Lotion Fast Absorbing Fragrance Free Sensitive Skin Formula  Keri Lotion Fast Absorbing Softly Scented Dry Skin Formula  Keri Original Lotion  Keri Skin Renewal Lotion Keri Silky Smooth Lotion  Keri Silky Smooth Sensitive Skin Lotion  Nivea Body Creamy Conditioning Oil  Nivea Body Extra Enriched Teacher, adult education Moisturizing Lotion Nivea Crme  Nivea Skin Firming Lotion  NutraDerm 30 Skin Lotion  NutraDerm Skin Lotion  NutraDerm Therapeutic Skin Cream  NutraDerm Therapeutic Skin Lotion  ProShield Protective Hand Cream  Provon moisturizing lotion

## 2023-05-08 NOTE — Progress Notes (Addendum)
 Anesthesia Review:  PCP: Amy Fargo,NP LOV 04/18/23  Cardiologist : none  Inf Dis- 11/22/22- LOV Allene Dillon   PPM/ ICD: Device Orders: Rep Notified:  Chest x-ray : EKG : 04/18/2023  Echo : Stress test: Cardiac Cath :   Activity level: can do a flight of stairs without difficutly  Sleep Study/ CPAP : none Fasting Blood Sugar :      / Checks Blood Sugar -- times a day:    Blood Thinner/ Instructions /Last Dose: ASA / Instructions/ Last Dose :    04/18/23- hgba1c- 6.0  CBC and CMP also done on 04/18/23- in epic    PT thought preop appt was at 0900am on 05/13/23.  Called pt at 0800and completed med hx and preop instructions via phone call.  PT to come to PST on 05/13/23 and obtain labs and pick up instrutions, chg soap and Ensure presurgery drink on 05/13/23.  Lab and preop instructiions given on 05/13/23 at 100pm.     Positive HIV

## 2023-05-13 ENCOUNTER — Encounter (HOSPITAL_COMMUNITY)
Admission: RE | Admit: 2023-05-13 | Discharge: 2023-05-13 | Disposition: A | Discharge status: Home / Self Care | Source: Ambulatory Visit | Attending: Orthopedic Surgery | Admitting: Orthopedic Surgery

## 2023-05-13 ENCOUNTER — Encounter (HOSPITAL_COMMUNITY): Payer: Self-pay

## 2023-05-13 ENCOUNTER — Other Ambulatory Visit: Payer: Self-pay

## 2023-05-13 VITALS — BP 142/98 | HR 84 | Temp 98.1°F | Resp 18 | Ht 71.0 in | Wt 183.0 lb

## 2023-05-13 DIAGNOSIS — Z01818 Encounter for other preprocedural examination: Secondary | ICD-10-CM

## 2023-05-13 DIAGNOSIS — Z01812 Encounter for preprocedural laboratory examination: Secondary | ICD-10-CM | POA: Diagnosis not present

## 2023-05-13 LAB — SURGICAL PCR SCREEN
MRSA, PCR: NEGATIVE
Staphylococcus aureus: NEGATIVE

## 2023-05-16 DIAGNOSIS — M87059 Idiopathic aseptic necrosis of unspecified femur: Secondary | ICD-10-CM | POA: Diagnosis present

## 2023-05-16 MED ORDER — TRANEXAMIC ACID 1000 MG/10ML IV SOLN
2000.0000 mg | INTRAVENOUS | Status: DC
  Filled 2023-05-16: qty 20

## 2023-05-16 NOTE — Anesthesia Preprocedure Evaluation (Signed)
 Anesthesia Evaluation  Patient identified by MRN, date of birth, ID band Patient awake    Reviewed: Allergy & Precautions, NPO status , Patient's Chart, lab work & pertinent test results  Airway Mallampati: II  TM Distance: >3 FB Neck ROM: Full    Dental no notable dental hx. (+) Dental Advisory Given, Teeth Intact   Pulmonary neg recent URI   Pulmonary exam normal breath sounds clear to auscultation       Cardiovascular negative cardio ROS Normal cardiovascular exam Rhythm:Regular Rate:Normal     Neuro/Psych negative neurological ROS     GI/Hepatic negative GI ROS, Neg liver ROS,,,  Endo/Other  negative endocrine ROS    Renal/GU negative Renal ROS     Musculoskeletal negative musculoskeletal ROS (+)    Abdominal   Peds  Hematology negative hematology ROS (+)   Anesthesia Other Findings   Reproductive/Obstetrics                             Anesthesia Physical Anesthesia Plan  ASA: 3  Anesthesia Plan: General   Post-op Pain Management: Tylenol PO (pre-op)*, Gabapentin PO (pre-op)* and Celebrex PO (pre-op)*   Induction: Intravenous  PONV Risk Score and Plan: 3 and Ondansetron, Dexamethasone, Treatment may vary due to age or medical condition and Propofol infusion  Airway Management Planned: Oral ETT  Additional Equipment: None  Intra-op Plan:   Post-operative Plan: Extubation in OR  Informed Consent: I have reviewed the patients History and Physical, chart, labs and discussed the procedure including the risks, benefits and alternatives for the proposed anesthesia with the patient or authorized representative who has indicated his/her understanding and acceptance.     Dental advisory given  Plan Discussed with: CRNA  Anesthesia Plan Comments:        Anesthesia Quick Evaluation

## 2023-05-16 NOTE — H&P (Signed)
 TOTAL HIP ADMISSION H&P  Patient is admitted for right total hip arthroplasty.  Subjective:  Chief Complaint: right hip pain  HPI: Stephen Lee, 33 y.o. male, has a history of pain and functional disability in the right hip(s) due to  AVN  and patient has failed non-surgical conservative treatments for greater than 12 weeks to include NSAID's and/or analgesics, flexibility and strengthening excercises, weight reduction as appropriate, and activity modification.  Onset of symptoms was gradual starting 1 years ago with gradually worsening course since that time.The patient noted no past surgery on the right hip(s).  Patient currently rates pain in the right hip at 10 out of 10 with activity. Patient has night pain, worsening of pain with activity and weight bearing, trendelenberg gait, pain that interfers with activities of daily living, and pain with passive range of motion. Patient has evidence of  AVN  by imaging studies. This condition presents safety issues increasing the risk of falls. This patient has had avascular necrosis of the hip, acetabular fracture, hip dysplasia.  There is no current active infection.  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  Current Facility-Administered  Medications  Medication Dose Route Frequency Provider Last Rate Last Admin   [START ON 05/17/2023] tranexamic acid (CYKLOKAPRON) 2,000 mg in sodium chloride 0.9 % 50 mL Topical Application  2,000 mg Topical On Call to OR Gean Birchwood, MD       Current Outpatient Medications  Medication Sig Dispense Refill Last Dose/Taking   bictegravir-emtricitabine-tenofovir AF (BIKTARVY) 50-200-25 MG TABS tablet Take 1 tablet by mouth daily. 30 tablet 5 Taking   No Known Allergies  Social History   Tobacco Use   Smoking status: Never   Smokeless tobacco: Never  Substance Use Topics   Alcohol use: Not Currently    No family history on file.   Review of Systems  Constitutional: Negative.   HENT: Negative.    Eyes: Negative.   Respiratory: Negative.    Cardiovascular: Negative.   Gastrointestinal: Negative.   Endocrine: Negative.   Genitourinary: Negative.   Musculoskeletal:  Positive for arthralgias and myalgias.  Skin: Negative.   Allergic/Immunologic: Negative.        HX of HIV  Neurological: Negative.   Hematological: Negative.   Psychiatric/Behavioral: Negative.      Objective:  Physical Exam Constitutional:      Appearance: Normal appearance. He is normal weight.  HENT:     Head: Normocephalic and atraumatic.     Nose: Nose normal.  Eyes:     Pupils: Pupils are equal, round, and reactive to light.  Cardiovascular:     Pulses: Normal pulses.  Pulmonary:     Effort: Pulmonary effort is normal.  Musculoskeletal:        General: Tenderness present.  Cervical back: Normal range of motion and neck supple.     Comments: He continues to have mild irritability with internal rotation of both hips.  He is able to internally rotate to approximately 15-20 on the right and approximately 30 on the left.  Mild pain with palpation over the medial aspect of the right knee.  No noticeable effusion.  No erythema or warmth.  Calves are soft and nontender.  Skin:    General: Skin is warm and  dry.  Neurological:     General: No focal deficit present.     Mental Status: He is alert and oriented to person, place, and time. Mental status is at baseline.  Psychiatric:        Mood and Affect: Mood normal.        Behavior: Behavior normal.        Thought Content: Thought content normal.        Judgment: Judgment normal.     Vital signs in last 24 hours:    Labs:   Estimated body mass index is 25.52 kg/m as calculated from the following:   Height as of 05/13/23: 5\' 11"  (1.803 m).   Weight as of 05/13/23: 83 kg.   Imaging Review MRIs of bilateral hips are reviewed in office today and this does show avascular necrosis of bilateral femoral heads with moderate surrounding marrow edema suggestive of active acute component.  Moderate to severe bilateral hip osteoarthritis.      Assessment/Plan:  End stage arthritis, right hip(s)  The patient history, physical examination, clinical judgement of the provider and imaging studies are consistent with end stage degenerative joint disease of the right hip(s) and total hip arthroplasty is deemed medically necessary. The treatment options including medical management, injection therapy, arthroscopy and arthroplasty were discussed at length. The risks and benefits of total hip arthroplasty were presented and reviewed. The risks due to aseptic loosening, infection, stiffness, dislocation/subluxation,  thromboembolic complications and other imponderables were discussed.  The patient acknowledged the explanation, agreed to proceed with the plan and consent was signed. Patient is being admitted for inpatient treatment for surgery, pain control, PT, OT, prophylactic antibiotics, VTE prophylaxis, progressive ambulation and ADL's and discharge planning.The patient is planning to be discharged home with home health services    Patient's anticipated LOS is less than 2 midnights, meeting these requirements: - Younger than 51 - Lives within 1 hour  of care - Has a competent adult at home to recover with post-op recover - NO history of  - Chronic pain requiring opiods  - Diabetes  - Coronary Artery Disease  - Heart failure  - Heart attack  - Stroke  - DVT/VTE  - Cardiac arrhythmia  - Respiratory Failure/COPD  - Renal failure  - Anemia  - Advanced Liver disease

## 2023-05-17 ENCOUNTER — Encounter (HOSPITAL_COMMUNITY)
Admission: RE | Disposition: A | Discharge status: Home / Self Care | Payer: Self-pay | Source: Ambulatory Visit | Attending: Orthopedic Surgery

## 2023-05-17 ENCOUNTER — Ambulatory Visit (HOSPITAL_COMMUNITY)

## 2023-05-17 ENCOUNTER — Encounter (HOSPITAL_COMMUNITY): Payer: Self-pay | Admitting: Orthopedic Surgery

## 2023-05-17 ENCOUNTER — Ambulatory Visit (HOSPITAL_BASED_OUTPATIENT_CLINIC_OR_DEPARTMENT_OTHER): Payer: Self-pay | Admitting: Certified Registered"

## 2023-05-17 ENCOUNTER — Ambulatory Visit (HOSPITAL_COMMUNITY)
Admission: RE | Admit: 2023-05-17 | Discharge: 2023-05-17 | Disposition: A | Discharge status: Home / Self Care | Source: Ambulatory Visit | Attending: Orthopedic Surgery | Admitting: Orthopedic Surgery

## 2023-05-17 ENCOUNTER — Other Ambulatory Visit: Payer: Self-pay

## 2023-05-17 ENCOUNTER — Ambulatory Visit (HOSPITAL_COMMUNITY): Payer: Self-pay | Admitting: Certified Registered"

## 2023-05-17 DIAGNOSIS — M87851 Other osteonecrosis, right femur: Secondary | ICD-10-CM | POA: Diagnosis present

## 2023-05-17 DIAGNOSIS — M87059 Idiopathic aseptic necrosis of unspecified femur: Secondary | ICD-10-CM | POA: Diagnosis present

## 2023-05-17 DIAGNOSIS — Z79624 Long term (current) use of inhibitors of nucleotide synthesis: Secondary | ICD-10-CM | POA: Diagnosis not present

## 2023-05-17 DIAGNOSIS — M2550 Pain in unspecified joint: Secondary | ICD-10-CM | POA: Diagnosis not present

## 2023-05-17 DIAGNOSIS — E876 Hypokalemia: Secondary | ICD-10-CM | POA: Insufficient documentation

## 2023-05-17 DIAGNOSIS — M87051 Idiopathic aseptic necrosis of right femur: Secondary | ICD-10-CM | POA: Diagnosis not present

## 2023-05-17 DIAGNOSIS — M791 Myalgia, unspecified site: Secondary | ICD-10-CM | POA: Insufficient documentation

## 2023-05-17 DIAGNOSIS — Z01818 Encounter for other preprocedural examination: Secondary | ICD-10-CM

## 2023-05-17 HISTORY — PX: TOTAL HIP ARTHROPLASTY: SHX124

## 2023-05-17 LAB — TYPE AND SCREEN
ABO/RH(D): B POS
Antibody Screen: NEGATIVE

## 2023-05-17 LAB — ABO/RH: ABO/RH(D): B POS

## 2023-05-17 SURGERY — ARTHROPLASTY, HIP, TOTAL, ANTERIOR APPROACH
Anesthesia: General | Site: Hip | Laterality: Right

## 2023-05-17 MED ORDER — CHLORHEXIDINE GLUCONATE 0.12 % MT SOLN
15.0000 mL | Freq: Once | OROMUCOSAL | Status: AC
  Administered 2023-05-17: 15 mL via OROMUCOSAL

## 2023-05-17 MED ORDER — BUPIVACAINE LIPOSOME 1.3 % IJ SUSP: 10.0000 mL | Freq: Once | INTRAMUSCULAR | Status: DC

## 2023-05-17 MED ORDER — ASPIRIN 81 MG PO TBEC: 81.0000 mg | DELAYED_RELEASE_TABLET | Freq: Two times a day (BID) | ORAL | 0 refills | Status: DC

## 2023-05-17 MED ORDER — FENTANYL CITRATE (PF) 100 MCG/2ML IJ SOLN
INTRAMUSCULAR | Status: AC
  Filled 2023-05-17: qty 2

## 2023-05-17 MED ORDER — PHENYLEPHRINE 80 MCG/ML (10ML) SYRINGE FOR IV PUSH (FOR BLOOD PRESSURE SUPPORT)
PREFILLED_SYRINGE | INTRAVENOUS | Status: DC | PRN
  Administered 2023-05-17: 40 ug via INTRAVENOUS
  Administered 2023-05-17 (×5): 80 ug via INTRAVENOUS

## 2023-05-17 MED ORDER — LACTATED RINGERS IV SOLN: INTRAVENOUS | Status: DC

## 2023-05-17 MED ORDER — LACTATED RINGERS IV BOLUS: 250.0000 mL | Freq: Once | INTRAVENOUS | Status: DC

## 2023-05-17 MED ORDER — HYDROMORPHONE HCL 1 MG/ML IJ SOLN
INTRAMUSCULAR | Status: AC
  Filled 2023-05-17: qty 1

## 2023-05-17 MED ORDER — LIDOCAINE HCL (PF) 2 % IJ SOLN
INTRAMUSCULAR | Status: AC
  Filled 2023-05-17: qty 5

## 2023-05-17 MED ORDER — OXYCODONE HCL 5 MG/5ML PO SOLN: 5.0000 mg | Freq: Once | ORAL | Status: AC | PRN

## 2023-05-17 MED ORDER — TRANEXAMIC ACID-NACL 1000-0.7 MG/100ML-% IV SOLN: 1000.0000 mg | Freq: Once | INTRAVENOUS | Status: DC

## 2023-05-17 MED ORDER — POVIDONE-IODINE 10 % EX SWAB: 2.0000 | Freq: Once | CUTANEOUS | Status: DC

## 2023-05-17 MED ORDER — ORAL CARE MOUTH RINSE: 15.0000 mL | Freq: Once | OROMUCOSAL | Status: AC

## 2023-05-17 MED ORDER — SUGAMMADEX SODIUM 200 MG/2ML IV SOLN
INTRAVENOUS | Status: DC | PRN
  Administered 2023-05-17: 170 mg via INTRAVENOUS

## 2023-05-17 MED ORDER — KETAMINE HCL 50 MG/5ML IJ SOSY
PREFILLED_SYRINGE | INTRAMUSCULAR | Status: AC
  Filled 2023-05-17: qty 5

## 2023-05-17 MED ORDER — SODIUM CHLORIDE (PF) 0.9 % IJ SOLN
INTRAMUSCULAR | Status: DC | PRN
  Administered 2023-05-17: 90 mL

## 2023-05-17 MED ORDER — DEXMEDETOMIDINE HCL IN NACL 80 MCG/20ML IV SOLN
INTRAVENOUS | Status: DC | PRN
  Administered 2023-05-17: 4 ug via INTRAVENOUS

## 2023-05-17 MED ORDER — DEXAMETHASONE SODIUM PHOSPHATE 10 MG/ML IJ SOLN
INTRAMUSCULAR | Status: AC
  Filled 2023-05-17: qty 1

## 2023-05-17 MED ORDER — 0.9 % SODIUM CHLORIDE (POUR BTL) OPTIME
TOPICAL | Status: DC | PRN
  Administered 2023-05-17: 1000 mL

## 2023-05-17 MED ORDER — ACETAMINOPHEN 500 MG PO TABS
1000.0000 mg | ORAL_TABLET | Freq: Once | ORAL | Status: DC
Start: 2023-05-17 — End: 2023-05-17
  Filled 2023-05-17: qty 2

## 2023-05-17 MED ORDER — TRANEXAMIC ACID-NACL 1000-0.7 MG/100ML-% IV SOLN
1000.0000 mg | INTRAVENOUS | Status: AC
Start: 2023-05-17 — End: 2023-05-17
  Administered 2023-05-17: 1000 mg via INTRAVENOUS
  Filled 2023-05-17: qty 100

## 2023-05-17 MED ORDER — CEFAZOLIN SODIUM-DEXTROSE 2-4 GM/100ML-% IV SOLN
2.0000 g | INTRAVENOUS | Status: AC
  Administered 2023-05-17: 2 g via INTRAVENOUS
  Filled 2023-05-17: qty 100

## 2023-05-17 MED ORDER — HYDROMORPHONE HCL 1 MG/ML IJ SOLN
0.2500 mg | INTRAMUSCULAR | Status: DC | PRN
  Administered 2023-05-17: 0.25 mg via INTRAVENOUS
  Administered 2023-05-17 (×2): 0.5 mg via INTRAVENOUS

## 2023-05-17 MED ORDER — LIDOCAINE HCL (PF) 2 % IJ SOLN
INTRAMUSCULAR | Status: DC | PRN
  Administered 2023-05-17: 100 mg via INTRADERMAL

## 2023-05-17 MED ORDER — TRANEXAMIC ACID 1000 MG/10ML IV SOLN
INTRAVENOUS | Status: DC | PRN
  Administered 2023-05-17: 2000 mg via TOPICAL

## 2023-05-17 MED ORDER — DROPERIDOL 2.5 MG/ML IJ SOLN: 0.6250 mg | Freq: Once | INTRAMUSCULAR | Status: DC | PRN

## 2023-05-17 MED ORDER — SODIUM CHLORIDE (PF) 0.9 % IJ SOLN
INTRAMUSCULAR | Status: AC
  Filled 2023-05-17: qty 50

## 2023-05-17 MED ORDER — ONDANSETRON HCL 4 MG/2ML IJ SOLN
INTRAMUSCULAR | Status: AC
  Filled 2023-05-17: qty 2

## 2023-05-17 MED ORDER — LACTATED RINGERS IV BOLUS
500.0000 mL | Freq: Once | INTRAVENOUS | Status: AC
  Administered 2023-05-17: 500 mL via INTRAVENOUS

## 2023-05-17 MED ORDER — LIDOCAINE 2% (20 MG/ML) 5 ML SYRINGE
INTRAMUSCULAR | Status: DC | PRN
Start: 2023-05-17 — End: 2023-05-17

## 2023-05-17 MED ORDER — PROPOFOL 10 MG/ML IV BOLUS
INTRAVENOUS | Status: DC | PRN
  Administered 2023-05-17: 200 mg via INTRAVENOUS

## 2023-05-17 MED ORDER — MIDAZOLAM HCL 2 MG/2ML IJ SOLN
INTRAMUSCULAR | Status: DC | PRN
  Administered 2023-05-17: 2 mg via INTRAVENOUS

## 2023-05-17 MED ORDER — ROCURONIUM BROMIDE 10 MG/ML (PF) SYRINGE
PREFILLED_SYRINGE | INTRAVENOUS | Status: DC | PRN
  Administered 2023-05-17: 70 mg via INTRAVENOUS

## 2023-05-17 MED ORDER — FENTANYL CITRATE (PF) 100 MCG/2ML IJ SOLN
INTRAMUSCULAR | Status: DC | PRN
  Administered 2023-05-17: 100 ug via INTRAVENOUS
  Administered 2023-05-17 (×2): 50 ug via INTRAVENOUS

## 2023-05-17 MED ORDER — KETAMINE HCL 50 MG/5ML IJ SOSY
PREFILLED_SYRINGE | INTRAMUSCULAR | Status: DC | PRN
Start: 2023-05-17 — End: 2023-05-17
  Administered 2023-05-17 (×2): 20 mg via INTRAVENOUS

## 2023-05-17 MED ORDER — OXYCODONE HCL 5 MG PO TABS
ORAL_TABLET | ORAL | Status: AC
  Administered 2023-05-17: 5 mg via ORAL
  Filled 2023-05-17: qty 1

## 2023-05-17 MED ORDER — ACETAMINOPHEN 500 MG PO TABS
1000.0000 mg | ORAL_TABLET | Freq: Once | ORAL | Status: AC
  Administered 2023-05-17: 1000 mg via ORAL

## 2023-05-17 MED ORDER — DEXMEDETOMIDINE HCL IN NACL 80 MCG/20ML IV SOLN
INTRAVENOUS | Status: AC
  Filled 2023-05-17: qty 20

## 2023-05-17 MED ORDER — BUPIVACAINE-EPINEPHRINE (PF) 0.5% -1:200000 IJ SOLN
INTRAMUSCULAR | Status: AC
  Filled 2023-05-17: qty 30

## 2023-05-17 MED ORDER — TIZANIDINE HCL 2 MG PO TABS: 2.0000 mg | ORAL_TABLET | Freq: Four times a day (QID) | ORAL | 0 refills | Status: DC | PRN

## 2023-05-17 MED ORDER — GABAPENTIN 300 MG PO CAPS
300.0000 mg | ORAL_CAPSULE | Freq: Once | ORAL | Status: AC
Start: 2023-05-17 — End: 2023-05-17
  Administered 2023-05-17: 300 mg via ORAL
  Filled 2023-05-17: qty 1

## 2023-05-17 MED ORDER — BUPIVACAINE LIPOSOME 1.3 % IJ SUSP
INTRAMUSCULAR | Status: AC
  Filled 2023-05-17: qty 10

## 2023-05-17 MED ORDER — LACTATED RINGERS IV SOLN
INTRAVENOUS | Status: DC
Start: 2023-05-17 — End: 2023-05-17

## 2023-05-17 MED ORDER — OXYCODONE-ACETAMINOPHEN 5-325 MG PO TABS: 1.0000 | ORAL_TABLET | ORAL | 0 refills | Status: DC | PRN

## 2023-05-17 MED ORDER — PHENYLEPHRINE 80 MCG/ML (10ML) SYRINGE FOR IV PUSH (FOR BLOOD PRESSURE SUPPORT)
PREFILLED_SYRINGE | INTRAVENOUS | Status: AC
  Filled 2023-05-17: qty 10

## 2023-05-17 MED ORDER — PROPOFOL 10 MG/ML IV BOLUS
INTRAVENOUS | Status: AC
  Filled 2023-05-17: qty 20

## 2023-05-17 MED ORDER — OXYCODONE HCL 5 MG PO TABS: 5.0000 mg | ORAL_TABLET | Freq: Once | ORAL | Status: AC | PRN

## 2023-05-17 MED ORDER — MIDAZOLAM HCL 2 MG/2ML IJ SOLN
INTRAMUSCULAR | Status: AC
  Filled 2023-05-17: qty 2

## 2023-05-17 MED ORDER — ONDANSETRON HCL 4 MG/2ML IJ SOLN
INTRAMUSCULAR | Status: DC | PRN
  Administered 2023-05-17: 4 mg via INTRAVENOUS

## 2023-05-17 MED ORDER — STERILE WATER FOR IRRIGATION IR SOLN
Status: DC | PRN
  Administered 2023-05-17: 1000 mL

## 2023-05-17 MED ORDER — DEXAMETHASONE SODIUM PHOSPHATE 10 MG/ML IJ SOLN
INTRAMUSCULAR | Status: DC | PRN
  Administered 2023-05-17: 10 mg via INTRAVENOUS

## 2023-05-17 SURGICAL SUPPLY — 37 items
BAG COUNTER SPONGE SURGICOUNT (BAG) ×1 IMPLANT
BAG DECANTER FOR FLEXI CONT (MISCELLANEOUS) ×2 IMPLANT
BLADE SAW SGTL 18X1.27X75 (BLADE) ×1 IMPLANT
COVER PERINEAL POST (MISCELLANEOUS) ×1 IMPLANT
COVER SURGICAL LIGHT HANDLE (MISCELLANEOUS) ×1 IMPLANT
CUP ACETBLR 54 OD 100 SERIES (Hips) IMPLANT
DRAPE STERI IOBAN 125X83 (DRAPES) ×1 IMPLANT
DRAPE U-SHAPE 47X51 STRL (DRAPES) ×2 IMPLANT
DRSG AQUACEL AG ADV 3.5X10 (GAUZE/BANDAGES/DRESSINGS) ×1 IMPLANT
DURAPREP 26ML APPLICATOR (WOUND CARE) ×1 IMPLANT
ELECT BLADE TIP CTD 4 INCH (ELECTRODE) ×1 IMPLANT
ELECT REM PT RETURN 15FT ADLT (MISCELLANEOUS) ×1 IMPLANT
ELIMINATOR HOLE APEX DEPUY (Hips) IMPLANT
FEM STEM 12/14 TAPER SZ 4 HIP (Orthopedic Implant) ×1 IMPLANT
FEMORAL STEM 12/14 TPR SZ4 HIP (Orthopedic Implant) IMPLANT
GLOVE BIO SURGEON STRL SZ7.5 (GLOVE) ×1 IMPLANT
GLOVE BIO SURGEON STRL SZ8.5 (GLOVE) ×1 IMPLANT
GLOVE BIOGEL PI IND STRL 8 (GLOVE) ×1 IMPLANT
GLOVE BIOGEL PI IND STRL 9 (GLOVE) ×1 IMPLANT
GOWN STRL REUS W/ TWL XL LVL3 (GOWN DISPOSABLE) ×2 IMPLANT
HEAD CERAMIC DELTA 36 PLUS 1.5 (Hips) IMPLANT
HOLDER FOLEY CATH W/STRAP (MISCELLANEOUS) ×1 IMPLANT
KIT TURNOVER KIT A (KITS) IMPLANT
LINER NEUTRAL 54X36MM PLUS 4 (Hips) IMPLANT
MANIFOLD NEPTUNE II (INSTRUMENTS) ×1 IMPLANT
NDL HYPO 21X1.5 SAFETY (NEEDLE) ×2 IMPLANT
NEEDLE HYPO 21X1.5 SAFETY (NEEDLE) ×2 IMPLANT
NS IRRIG 1000ML POUR BTL (IV SOLUTION) ×1 IMPLANT
PACK ANTERIOR HIP CUSTOM (KITS) ×1 IMPLANT
SPIKE FLUID TRANSFER (MISCELLANEOUS) ×1 IMPLANT
SUT VIC AB 0 CT1 36 (SUTURE) ×1 IMPLANT
SUT VIC AB 1 CTX36XBRD ANBCTR (SUTURE) ×1 IMPLANT
SUT VIC AB 2-0 CT1 TAPERPNT 27 (SUTURE) ×1 IMPLANT
SUT VICRYL+ 3-0 36IN CT-1 (SUTURE) ×1 IMPLANT
SYR CONTROL 10ML LL (SYRINGE) ×3 IMPLANT
TRAY FOLEY MTR SLVR 16FR STAT (SET/KITS/TRAYS/PACK) IMPLANT
TUBE SUCTION HIGH CAP CLEAR NV (SUCTIONS) ×1 IMPLANT

## 2023-05-17 NOTE — Op Note (Signed)
 PATIENT ID:      Stephen Lee  MRN:     [id number] DOB/AGE:    17-Apr-1990 / 33 y.o.  OPERATIVE REPORT   DATE OF PROCEDURE:  05/17/2023      PREOPERATIVE DIAGNOSIS:  RIGHT HIP AVACULAR NECROSIS WITH COLLAPSE                                                         POSTOPERATIVE DIAGNOSIS:  Same                                                         PROCEDURE: Anterior R total hip arthroplasty using a 54 mm DePuy Pinnacle  Cup, [employer], 0-degree polyethylene liner, a +1.5 mm x 36mm ceramic head, a 4hi Depuy Actis stem  SURGEON: Nestor Lewandowsky  ASSISTANT:   Tomi Likens. [employer]  (present throughout entire procedure and necessary for timely completion of the procedure)   ANESTHESIA: Spinal, Exparel 133mg  injection BLOOD LOSS: 300 cc FLUID REPLACEMENT: 1500 cc crystalloid TRANEXAMIC ACID: 1gm IV, 2gm Topical COMPLICATIONS: none    INDICATIONS FOR PROCEDURE: A 33 y.o. year-old With  RIGHT HIP AVACULAR NECROSIS WITH COLLAPSE   for 3 years, x-rays show bone-on-bone arthritic changes, and osteophytes. Despite conservative measures with observation, anti-inflammatory medicine, narcotics, use of a cane, has severe unremitting pain and can ambulate only a few blocks before resting. Patient desires elective R total hip arthroplasty to decrease pain and increase function. The risks, benefits, and alternatives were discussed at length including but not limited to the risks of infection, bleeding, nerve injury, stiffness, blood clots, the need for revision surgery, cardiopulmonary complications, among others, and they were willing to proceed. Questions answered      PROCEDURE IN DETAIL: The patient was identified by armband, received preoperative IV antibiotics, in the holding area at [facility], taken to the operating room , appropriate anesthetic monitors were attached and anesthesia was induced with the patient on the gurney. HANA boots were applied to the feet, and the patient  was  transferred to the HANA table with a peroneal post and support underneath the non-operative leg. The operative lower extremity was then prepped and draped in the usual sterile fashion from just above the iliac crest to the knee. And a timeout procedure was performed. Tomi Likens. [facility] was present and scrubbed throughout the case, critical for assistance with, positioning, exposure, retraction, instrumentation, and closure.Skin along incision area was injected with 10 cc of Exparel solution. We then made a 12 cm incision along the interval at the leading edge of the tensor fascia lata of starting at 2 cm lateral to the ASIS. Small bleeders in the skin and subcutaneous tissue identified and cauterized we dissected down to the fascia and made an incision in the fascia allowing [place] to elevate the fascia of the tensor muscle and exploited the interval between the rectus and the tensor fascia lata. A Cobra retractor was then placed along the superior neck of the femur. A cerebellar retractor was used to expose the interval between the tensor fascia lata and the rectus femoris.  We  identified and cauterized the ascending branch of the anterior circumflex artery. A second Cobra retractor along the inferior neck of the femur. A small Hohmann retractor was placed underneath the origin of the rectus femoris, giving [place] good medial exposure. Using Ronguers fatty tissue was removed from in front of the anterior capsule. The capsule was then incised, starting out at the superior anterior rim of the acetabulum going laterally along the anterior neck. The capsule was then teed along the neck superiorly and inferiorly. Electrocautery was used to release capsule from the anterior and medial neck of the femur to allow external rotation. The cobra retractors were then placed along the inferior and superior neck. The hip was externally rotated to 40 degrees, traction applied and locked. We perform a standard neck cut with the  oscillating saw and removed the femoral head with a power corkscrew. We then placed a medium curved medium homan retractor in the cotyloid notch and standard cobra retractor posteriorly along the acetabular rim. Exposed labral tissue and osteophytes were then removed. We then sequentially reamed up to a 53 mm basket reamer obtaining good coverage in all quadrants, verified by C-arm imaging. Under C-arm control we then hammered into place a 54 mm Pinnacle cup in 45 of abduction and 15 of anteversion. The cup seated nicely and required no supplemental screws. We then placed a central hole Eliminator and a +4 mm, 0 polyethylene liner. The foot was then externally rotated to 130-140. The limb was extended and adducted to the floor, delivering the proximal femur up into the wound. A medium curved Hohmann retractor was placed over the greater trochanter and a long Homan retractor along the posterior femoral neck completing the exposure and lateralizing the femur. We then performed releases superiorly and and inferiorly of the capsule going back to the pirformis fossa superiorly and to the lesser trochanter inferiorly. We then entered the proximal femur with the box cutting offset chisel followed by, a canal sounder, the chili pepper and broaching up to a 4 broach. This seated nicely and we reamed the calcar. A trial reduction was performed with a 1.5 mm X 36 mm head.The limb lengths were checked by c-arm, and the hip was stable in 90 of external rotation. At this point the trial components removed and we hammered into place a hi  Offset # 4Actis stem with coating. A + 1.5 mm x 36 head was then hammered into place. The hip was reduced and final C-arm images obtained. The wound was thoroughly irrigated with normal saline solution. We repaired the ant capsule and the tensor fascia lot a with running 0 vicryl suture. the subcutaneous and subcuticular layers were closed with running 3-0 Vicryl suture followed by an  Aquacil dressing. At this point the patient was awaken and transferred to hospital gurney without difficulty.   Nestor Lewandowsky 05/17/2023, 6:07 AM

## 2023-05-17 NOTE — Transfer of Care (Signed)
 Immediate Anesthesia Transfer of Care Note  Patient: Stephen Lee  Procedure(s) Performed: ARTHROPLASTY, HIP, TOTAL, ANTERIOR APPROACH (Right: Hip)  Patient Location: PACU  Anesthesia Type:General  Level of Consciousness: awake, alert , and patient cooperative  Airway & Oxygen Therapy: Patient Spontanous Breathing and Patient connected to face mask oxygen  Post-op Assessment: Report given to RN and Post -op Vital signs reviewed and stable  Post vital signs: Reviewed and stable  Last Vitals:  Vitals Value Taken Time  BP 141/99 05/17/23 0907  Temp 37 C 05/17/23 0907  Pulse 95 05/17/23 0910  Resp 19 05/17/23 0910  SpO2 100 % 05/17/23 0910  Vitals shown include unfiled device data.  Last Pain:  Vitals:   05/17/23 0614  TempSrc:   PainSc: 3          Complications: No notable events documented.

## 2023-05-17 NOTE — Interval H&P Note (Signed)
 History and Physical Interval Note:  05/17/2023 6:05 AM  [name]  has presented today for surgery, with the diagnosis of RIGHT HIP AVACULAR NECROSIS WITH COLLAPSE.  The various methods of treatment have been discussed with the patient and family. After consideration of risks, benefits and other options for treatment, the patient has consented to  Procedure(s) with comments: ARTHROPLASTY, HIP, TOTAL, ANTERIOR APPROACH (Right) - RIGHT TOTAL ANTEIROR HIP ARTHROPLASTY as a surgical intervention.  The patient's history has been reviewed, patient examined, no change in status, stable for surgery.  I have reviewed the patient's chart and labs.  Questions were answered to the patient's satisfaction.     Stephen Lee

## 2023-05-17 NOTE — Evaluation (Signed)
 Physical Therapy Evaluation Patient Details Name: Stephen Lee MRN: [id number] DOB: October 01, 1990 Today's Date: 05/17/2023  History of Present Illness  Pt is 33 yo male admitted on 05/17/23 for R anterior THA due to AVN.  Pt with hx including but not limited to AVN of femur, HIV, attention deficit  Clinical Impression  Pt is s/p THA resulting in the deficits listed below (see PT Problem List). PT with orders to see pt for possible SDDC.  At baseline, pt active and independent.  He has home support, needs RW, lives on 2nd floor apartment.  Today, pt with good pain control - reports stiff but feels much better than prior to surgery.  He was able to ambulate 58' with RW steadily and performed stairs similar to home set up without difficulty.   Pt demonstrates safe gait & transfers in order to return home from PT perspective once discharged by MD.  While in hospital, will continue to benefit from PT for skilled therapy to advance mobility and exercises.             If plan is discharge home, recommend the following: A little help with walking and/or transfers;A little help with bathing/dressing/bathroom;Assistance with cooking/housework;Help with stairs or ramp for entrance   Can travel by private vehicle        Equipment Recommendations Rolling walker (2 wheels)  Recommendations for Other Services       Functional Status Assessment Patient has had a recent decline in their functional status and demonstrates the ability to make significant improvements in function in a reasonable and predictable amount of time.     Precautions / Restrictions Precautions Precautions: Fall Restrictions Weight Bearing Restrictions Per Provider Order: Yes RLE Weight Bearing Per Provider Order: Weight bearing as tolerated      Mobility  Bed Mobility Overal bed mobility: Needs Assistance Bed Mobility: Supine to Sit, Sit to Supine     Supine to sit: Contact guard, HOB elevated Sit to supine: Min assist    General bed mobility comments: Light min A for R LE back to bed ; also educated on AAROM with gait belt or L leg to assist    Transfers Overall transfer level: Needs assistance Equipment used: Rolling walker (2 wheels) Transfers: Sit to/from Stand Sit to Stand: Contact guard assist           General transfer comment: cues for hand placement    Ambulation/Gait Ambulation/Gait assistance: Contact guard assist Gait Distance (Feet): 80 Feet Assistive device: Rolling walker (2 wheels) Gait Pattern/deviations: Step-through pattern, Decreased stride length Gait velocity: decreased but functional     General Gait Details: Cues to push RW; tolerated well; steady gait  Stairs Stairs: Yes Stairs assistance: Contact guard assist Stair Management: Two rails, Step to pattern, Forwards Number of Stairs: 5 General stair comments: Cued for sequence; performed without difficulty; educated to have assist for stairs  Wheelchair Mobility     Tilt Bed    Modified Rankin (Stroke Patients Only)       Balance Overall balance assessment: Needs assistance Sitting-balance support: No upper extremity supported Sitting balance-Leahy Scale: Good     Standing balance support: Bilateral upper extremity supported, No upper extremity supported Standing balance-Leahy Scale: Fair Standing balance comment: RW to ambulate but could static stand without AD                             Pertinent Vitals/Pain Pain Assessment Pain Assessment: 0-10 Pain  Score: 5  Pain Location: R knee (more pain in knee than hip) Pain Descriptors / Indicators: Discomfort Pain Intervention(s): Monitored during session, Limited activity within patient's tolerance, Premedicated before session, Repositioned, Ice applied    Home Living Family/patient expects to be discharged to:: Private residence Living Arrangements: Other (Comment) (best friend staying with him) Available Help at Discharge: Available 24  hours/day;Friend(s) Type of Home: Apartment Home Access: Stairs to enter Entrance Stairs-Rails: Right;Left;Can reach both Entrance Stairs-Number of Steps: 1 flight   Home Layout: One level Home Equipment: Cane - single point      Prior Function Prior Level of Function : Independent/Modified Independent;Working/employed                     Extremity/Trunk Assessment   Upper Extremity Assessment Upper Extremity Assessment: Overall WFL for tasks assessed    Lower Extremity Assessment Lower Extremity Assessment: LLE deficits/detail;RLE deficits/detail RLE Deficits / Details: Expected post op changes; ROM WFL; MMT: ankle 5/5, knee 3/5, hip 2/5 LLE Deficits / Details: ROM WFL; MMT 5/5; does report AVN of this hip also with need for TKA in near future    Cervical / Trunk Assessment Cervical / Trunk Assessment: Normal  Communication        Cognition Arousal: Alert Behavior During Therapy: WFL for tasks assessed/performed   PT - Cognitive impairments: No apparent impairments                                 Cueing       General Comments   Educated on safe ice use, no pivots, car transfers, and TED hose during day. Also, encouraged walking every 1-2 hours during day for 5-10 mins. Educated on HEP with focus on mobility the first week. Discussed doing exercises within pain control and if pain increasing could decreased ROM, reps, and stop exercises as needed. Encouraged to perform ankle pumps frequently for blood flow.    Exercises Total Joint Exercises Ankle Circles/Pumps: AROM, Both, Supine, 5 reps Quad Sets: AROM, Both, Supine, 5 reps Heel Slides: AAROM, Right, 5 reps, Supine Hip ABduction/ADduction: AAROM, Right, 5 reps, Supine Long Arc Quad: AROM, Both, 5 reps, Seated Other Exercises Other Exercises: Educated on AAROM techniques as needed.  Also educated on R LE standing exercises with UE support but discussed not performing if too painful on L side.    Assessment/Plan    PT Assessment Patient needs continued PT services  PT Problem List Decreased strength;Pain;Decreased range of motion;Decreased cognition;Decreased activity tolerance;Decreased balance;Decreased knowledge of use of DME;Decreased mobility       PT Treatment Interventions DME instruction;Therapeutic exercise;Gait training;Balance training;Stair training;Functional mobility training;Therapeutic activities;Patient/family education;Modalities    PT Goals (Current goals can be found in the Care Plan section)  Acute Rehab PT Goals Patient Stated Goal: return home PT Goal Formulation: With patient Time For Goal Achievement: 05/31/23 Potential to Achieve Goals: Good    Frequency 7X/week     Co-evaluation               AM-PAC PT "6 Clicks" Mobility  Outcome Measure Help needed turning from your back to your side while in a flat bed without using bedrails?: A Little Help needed moving from lying on your back to sitting on the side of a flat bed without using bedrails?: A Little Help needed moving to and from a bed to a chair (including a wheelchair)?: A Little Help needed standing  up from a chair using your arms (e.g., wheelchair or bedside chair)?: A Little Help needed to walk in hospital room?: A Little Help needed climbing 3-5 steps with a railing? : A Little 6 Click Score: 18    End of Session Equipment Utilized During Treatment: Gait belt Activity Tolerance: Patient tolerated treatment well Patient left: in bed;with call bell/phone within reach (in PACU) Nurse Communication: Mobility status PT Visit Diagnosis: Other abnormalities of gait and mobility (R26.89);Muscle weakness (generalized) (M62.81)    Time: 4098-1191 PT Time Calculation (min) (ACUTE ONLY): 35 min   Charges:   PT Evaluation $PT Eval Low Complexity: 1 Low PT Treatments $Gait Training: 8-22 mins PT General Charges $$ ACUTE PT VISIT: 1 Visit         Anise Salvo, PT Acute Rehab  [facility] [phone]   Rayetta Humphrey 05/17/2023, 12:45 PM

## 2023-05-17 NOTE — Anesthesia Postprocedure Evaluation (Signed)
 Anesthesia Post Note  Patient: Stephen Lee  Procedure(s) Performed: ARTHROPLASTY, HIP, TOTAL, ANTERIOR APPROACH (Right: Hip)     Patient location during evaluation: PACU Anesthesia Type: General Level of consciousness: awake and alert Pain management: pain level controlled Vital Signs Assessment: post-procedure vital signs reviewed and stable Respiratory status: spontaneous breathing Cardiovascular status: stable Anesthetic complications: no   No notable events documented.  Last Vitals:  Vitals:   05/17/23 1100 05/17/23 1200  BP: (!) 144/93 (!) 145/83  Pulse: 80 89  Resp:    Temp:    SpO2: 94% 95%    Last Pain:  Vitals:   05/17/23 1200  TempSrc:   PainSc: 4                  Salahuddin Arismendez [employer]

## 2023-05-17 NOTE — Discharge Instructions (Addendum)

## 2023-05-17 NOTE — Anesthesia Procedure Notes (Signed)
 Procedure Name: Intubation Date/Time: 05/17/2023 7:19 AM  Performed by: Sindy Guadeloupe, CRNAPre-anesthesia Checklist: Patient identified, Emergency Drugs available, Suction available, Patient being monitored and Timeout performed Patient Re-evaluated:Patient Re-evaluated prior to induction Oxygen Delivery Method: Circle system utilized Preoxygenation: Pre-oxygenation with 100% oxygen Induction Type: IV induction Ventilation: Mask ventilation without difficulty Laryngoscope Size: Mac and 4 Grade View: Grade I Tube type: Oral Tube size: 7.5 mm Number of attempts: 1 Airway Equipment and Method: Stylet Placement Confirmation: ETT inserted through vocal cords under direct vision, positive ETCO2 and breath sounds checked- equal and bilateral Secured at: 23 cm Tube secured with: Tape Dental Injury: Teeth and Oropharynx as per pre-operative assessment

## 2023-05-20 ENCOUNTER — Ambulatory Visit: Attending: Orthopedic Surgery

## 2023-05-20 ENCOUNTER — Encounter (HOSPITAL_COMMUNITY): Payer: Self-pay | Admitting: Orthopedic Surgery

## 2023-05-20 DIAGNOSIS — R262 Difficulty in walking, not elsewhere classified: Secondary | ICD-10-CM | POA: Insufficient documentation

## 2023-05-20 DIAGNOSIS — M25551 Pain in right hip: Secondary | ICD-10-CM | POA: Diagnosis present

## 2023-05-20 DIAGNOSIS — M6281 Muscle weakness (generalized): Secondary | ICD-10-CM | POA: Diagnosis present

## 2023-05-20 NOTE — Therapy (Signed)
 OUTPATIENT PHYSICAL THERAPY LOWER EXTREMITY EVALUATION   Patient Name: Stephen Lee MRN: [id number] DOB:Dec 29, 1990, 33 y.o., male Today's Date: 05/20/2023  END OF SESSION:  PT End of Session - 05/20/23 1151     Visit Number 1    Number of Visits 12    Date for PT Re-Evaluation 07/20/23    Authorization Type [place] [facility]    PT Start Time 1100   LAte for appointment   PT Stop Time 1130    PT Time Calculation (min) 30 min    Activity Tolerance Patient tolerated treatment well    Behavior During Therapy WFL for tasks assessed/performed             Past Medical History:  Diagnosis Date   Attention deficit    Condyloma acuminatum in male    perianal & anal canal   HIV (human immunodeficiency virus infection) (HCC)    Past Surgical History:  Procedure Laterality Date   TOTAL HIP ARTHROPLASTY Right 05/17/2023   Procedure: ARTHROPLASTY, HIP, TOTAL, ANTERIOR APPROACH;  Surgeon: Gean Birchwood, MD;  Location: [facility];  Service: Orthopedics;  Laterality: Right;  RIGHT TOTAL ANTEIROR HIP ARTHROPLASTY   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 Year history of R hip pain prior to THA on 05/17/23  PERTINENT HISTORY: He is scheduled for hip replacement surgery with Dr. [name] due to avascular necrosis, diagnosed after experiencing significant unresolved hip pain. The pain is severe, impacting his ability to walk. Initial evaluations included an x-ray of his back and an ultrasound of his legs, which did not identify the issue. Eventually, x-rays and an MRI confirmed avascular necrosis. The pain is intermittent but severe, affecting his daily activities and work.  PAIN:  Are you having pain? Yes: NPRS scale: 8/10 Pain location: R hip Pain description: ache, sharp, throb Aggravating factors: movement and weightbearing Relieving factors: rest and medication  PRECAUTIONS: Anterior hip  RED FLAGS: None   WEIGHT BEARING RESTRICTIONS:  WBAT  FALLS:  Has patient fallen in last 6 months? No  OCCUPATION: Production designer, theatre/television/film  PLOF: Independent  PATIENT GOALS: To return to my PLOF  NEXT MD VISIT: TBD  OBJECTIVE:  Note: Objective measures were completed at Evaluation unless otherwise noted.  DIAGNOSTIC FINDINGS: none available  PATIENT SURVEYS:  LEFS 14/80   MUSCLE LENGTH: deferrred  POSTURE:  flexed R hip posture  PALPATION: deferred  LOWER EXTREMITY ROM:  Passive ROM Right eval Left eval  Hip flexion 90d   Hip extension 0d   Hip abduction    Hip adduction    Hip internal rotation    Hip external rotation  Knee flexion    Knee extension    Ankle dorsiflexion    Ankle plantarflexion    Ankle inversion    Ankle eversion     (Blank rows = not tested)  LOWER EXTREMITY MMT:  MMT Right eval Left eval  Hip flexion 3   Hip extension 3   Hip abduction 2   Hip adduction    Hip internal rotation    Hip external rotation    Knee flexion    Knee extension    Ankle dorsiflexion    Ankle plantarflexion    Ankle inversion    Ankle eversion     (Blank rows = not  tested)  LOWER EXTREMITY SPECIAL TESTS:  deferred  FUNCTIONAL TESTS:  30 seconds chair stand test 1 rep  GAIT: Distance walked: 70ftx2 Assistive device utilized: Walker - 2 wheeled Level of assistance: Complete Independence Comments: slow antalgic step to pattern                                                                                                                                TREATMENT DATE:  [facility] Adult PT Treatment:                                                DATE: 05/20/23 Eval and HEP Self Care: Additional minutes spent for educating on updated Therapeutic Home Exercise Program as well as comparing current status to condition at start of symptoms. This included exercises focusing on stretching, strengthening, with focus on eccentric aspects. Long term goals include an improvement in range of motion, strength, endurance as well as avoiding reinjury. Patient's frequency would include in 1-2 times a day, 3-5 times a week for a duration of 6-12 weeks. Proper technique shown and discussed handout in great detail. All questions were discussed and addressed.      PATIENT EDUCATION:  Education details: Discussed eval findings, rehab rationale and POC and patient is in agreement  Person educated: Patient Education method: Explanation Education comprehension: verbalized understanding and needs further education  HOME EXERCISE PROGRAM: Access Code: [email or web address] URL: [email or web address] Date: 05/20/2023 Prepared by: Gustavus Bryant  Exercises - Standing Heel Raise with Support  - 3 x daily - 5 x weekly - 2 sets - 10 reps - Bent Knee Fallouts with Alternating Legs  - 3 x daily - 5 x weekly - 2 sets - 10 reps - Supine Quad Set  - 3 x daily - 5 x weekly - 2 sets - 10 reps - Supine Heel Slide  - 3 x daily - 5 x weekly - 2 sets - 10 reps  ASSESSMENT:  CLINICAL IMPRESSION: Patient is a 33 y.o. male who was seen today for physical therapy evaluation and treatment  s/p R THA. Patient is WBAT ambulating with RW.  R hip  mobility limited by tightness/stiffness.  Patient able to transfer I, strength and function deficits evident on 30s chair stand test.  Non removable post op dressing in place w/o drainage.  LEFS score 14/80  OBJECTIVE IMPAIRMENTS: Abnormal gait, decreased activity tolerance, decreased coordination, decreased endurance, decreased knowledge of use of DME, decreased mobility, difficulty walking, decreased ROM, decreased strength, impaired perceived functional ability, improper body mechanics, and pain.   ACTIVITY LIMITATIONS: carrying, lifting, bending, sitting, standing, squatting, sleeping, stairs, transfers, bed mobility, and locomotion level  PERSONAL FACTORS: Age, Fitness, and Past/current experiences are also affecting patient's functional outcome.   REHAB POTENTIAL: Good  CLINICAL DECISION MAKING: Stable/uncomplicated  EVALUATION COMPLEXITY: Low   GOALS: Goals reviewed with patient? No  SHORT TERM GOALS: Target date: 06/10/2023   Patient to demonstrate independence in HEP Baseline: [id number] Goal status: INITIAL  2.  274ft ambulation with LRAD Baseline: 61ft with RW Goal status: INITIAL   LONG TERM GOALS: Target date: 07/01/2023  Patient will score at least 60/80 on LEFS to signify clinically meaningful improvement in functional abilities.   Baseline: 14/80 Goal status: INITIAL  2.  Patient will acknowledge 4/10 pain at least once during episode of care   Baseline: 7/10 Goal status: INITIAL  3.  Patient will increase 30s chair stand reps from 1 to 10 with/without arms to demonstrate and improved functional ability with less pain/difficulty as well as reduce fall risk.  Baseline: 1 Goal status: INITIAL  4.  Increase RLE strength to 4/5 Baseline:  MMT Right eval Left eval  Hip flexion 3   Hip extension 3   Hip abduction 2    Goal status: INITIAL  5.  Increase R hip AROM to 120d flexion, 10d extension Baseline:   Passive ROM Right eval Left eval  Hip flexion 90d   Hip extension 0d    Goal status: INITIAL     PLAN:  PT FREQUENCY: 2x/week  PT DURATION: 6 weeks  PLANNED INTERVENTIONS: 97164- PT Re-evaluation, 97110-Therapeutic exercises, 97530- Therapeutic activity, 97112- Neuromuscular re-education, 97535- Self Care, 28413- Manual therapy, [phone]- Gait training, [phone]- Aquatic Therapy, Patient/Family education, Balance training, Stair training, Dry Needling, and DME instructions  PLAN FOR NEXT SESSION: HEP review and update, manual techniques as appropriate, aerobic tasks, ROM and flexibility activities, strengthening and PREs, TPDN, gait and balance training as needed   For all possible CPT codes, reference the Planned Interventions line above.     Check all conditions that are expected to impact treatment: {Conditions expected to impact treatment:None of these apply   If treatment provided at initial evaluation, no treatment charged due to lack of authorization.       Hildred Laser, PT 05/20/2023, 11:52 AM

## 2023-05-21 NOTE — Therapy (Unsigned)
 OUTPATIENT PHYSICAL THERAPY LOWER EXTREMITY EVALUATION   Patient Name: Stephen Lee MRN: [id number] DOB:1991-01-31, 33 y.o., male Today's Date: 05/22/2023  END OF SESSION:  PT End of Session - 05/22/23 1232     Visit Number 2    Number of Visits 12    Date for PT Re-Evaluation 07/20/23    Authorization Type [place] [facility]    PT Start Time 1230   late again for session   PT Stop Time 1300    PT Time Calculation (min) 30 min    Activity Tolerance Patient tolerated treatment well    Behavior During Therapy WFL for tasks assessed/performed              Past Medical History:  Diagnosis Date   Attention deficit    Condyloma acuminatum in male    perianal & anal canal   HIV (human immunodeficiency virus infection) (HCC)    Past Surgical History:  Procedure Laterality Date   TOTAL HIP ARTHROPLASTY Right 05/17/2023   Procedure: ARTHROPLASTY, HIP, TOTAL, ANTERIOR APPROACH;  Surgeon: Gean Birchwood, MD;  Location: [facility];  Service: Orthopedics;  Laterality: Right;  RIGHT TOTAL ANTEIROR HIP ARTHROPLASTY   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   HIV (human immunodeficiency virus infection) (HCC) 07/19/2021   Condyloma acuminatum in male 10/12/2010    PCP: [name], NP   REFERRING PROVIDER: Gean Birchwood, MD  REFERRING DIAG: S/P RIGHT THA , PLEASE SCHEDULE PT APPOUNTMENT WITHIN 48 - 72 HOURS OF SURGERY  THERAPY DIAG:  Pain in right hip  Muscle weakness (generalized)  Difficulty in walking, not elsewhere classified  Rationale for Evaluation and Treatment: Rehabilitation  ONSET DATE:  05/17/23  SUBJECTIVE:   SUBJECTIVE STATEMENT: Has been moving around better but is having difficulty with CG transportation and help  PERTINENT HISTORY: He is scheduled for hip replacement surgery with Dr. [name] due to avascular necrosis, diagnosed after experiencing significant unresolved hip pain. The pain is severe, impacting his ability to walk. Initial evaluations included an x-ray of his back and an ultrasound of his legs, which did not identify the issue. Eventually, x-rays and an MRI confirmed avascular necrosis. The pain is intermittent but severe, affecting his daily activities and work.  PAIN:  Are you having pain? Yes: NPRS scale: 8/10 Pain location: R hip Pain description: ache, sharp, throb Aggravating factors: movement and weightbearing Relieving factors: rest and medication  PRECAUTIONS: Anterior hip  RED FLAGS: None   WEIGHT BEARING RESTRICTIONS:  WBAT  FALLS:  Has patient fallen in last 6 months? No  OCCUPATION: Production designer, theatre/television/film  PLOF: Independent  PATIENT GOALS: To return to my PLOF  NEXT MD VISIT: TBD  OBJECTIVE:  Note: Objective measures were completed at Evaluation unless otherwise noted.  DIAGNOSTIC FINDINGS: none available  PATIENT SURVEYS:  LEFS 14/80   MUSCLE LENGTH: deferrred  POSTURE:  flexed R hip posture  PALPATION: deferred  LOWER EXTREMITY ROM:  Passive ROM Right eval Left eval  Hip flexion 90d   Hip extension 0d   Hip abduction    Hip adduction    Hip internal rotation    Hip  external rotation    Knee flexion    Knee extension    Ankle dorsiflexion    Ankle plantarflexion    Ankle inversion    Ankle eversion     (Blank rows = not tested)  LOWER EXTREMITY MMT:  MMT Right eval Left eval  Hip flexion 3   Hip extension 3   Hip abduction 2   Hip adduction    Hip internal rotation    Hip external rotation    Knee flexion    Knee extension    Ankle dorsiflexion    Ankle plantarflexion    Ankle inversion    Ankle  eversion     (Blank rows = not tested)  LOWER EXTREMITY SPECIAL TESTS:  deferred  FUNCTIONAL TESTS:  30 seconds chair stand test 1 rep  GAIT: Distance walked: 41ftx2 Assistive device utilized: Environmental consultant - 2 wheeled Level of assistance: Complete Independence Comments: slow antalgic step to pattern                                                                                                                                TREATMENT DATE:  [facility] Adult PT Treatment:                                                DATE: 05/22/23  Neuromuscular re-ed: Supine hip fallouts RTB 10x B, 10/10 unilaterally S/L clams R only 10x Bridge against RTB 10x Therapeutic Activity: R hip flexor stretch 30s x2 FAQs with adduction 10x Heel slides with strap 10x Heel/toe in walker 10x  [facility] Adult PT Treatment:                                                DATE: 05/20/23 Eval and HEP Self Care: Additional minutes spent for educating on updated Therapeutic Home Exercise Program as well as comparing current status to condition at start of symptoms. This included exercises focusing on stretching, strengthening, with focus on eccentric aspects. Long term goals include an improvement in range of motion, strength, endurance as well as avoiding reinjury. Patient's frequency would include in 1-2 times a day, 3-5 times a week for a duration of 6-12 weeks. Proper technique shown and discussed handout in great detail. All questions were discussed and addressed.      PATIENT EDUCATION:  Education details: Discussed eval findings, rehab rationale and POC and patient is in agreement  Person educated: Patient Education method: Explanation Education comprehension: verbalized understanding and needs further education  HOME EXERCISE PROGRAM: Access Code: [email or web address] URL: [email or web address] Date: 05/20/2023 Prepared by: Gustavus Bryant  Exercises - Standing Heel Raise with Support  - 3 x daily - 5 x weekly  -  2 sets - 10 reps - Bent Knee Fallouts with Alternating Legs  - 3 x daily - 5 x weekly - 2 sets - 10 reps - Supine Quad Set  - 3 x daily - 5 x weekly - 2 sets - 10 reps - Supine Heel Slide  - 3 x daily - 5 x weekly - 2 sets - 10 reps  ASSESSMENT:  CLINICAL IMPRESSION: Patient arrived late for session, treatment abbreviated as a result.  Improved gait pattern observed: more fluid, increased cadence and longer step length.  Sit/stand/supine transitions more fluid as well. Incorporated R hip strength and stabilization exercises and tasks against t-band resistance.  Included R hip flexor stretches to promote hip extension.  Patient is a 33 y.o. male who was seen today for physical therapy evaluation and treatment s/p R THA. Patient is WBAT ambulating with RW.  R hip mobility limited by tightness/stiffness.  Patient able to transfer I, strength and function deficits evident on 30s chair stand test.  Non removable post op dressing in place w/o drainage.  LEFS score 14/80  OBJECTIVE IMPAIRMENTS: Abnormal gait, decreased activity tolerance, decreased coordination, decreased endurance, decreased knowledge of use of DME, decreased mobility, difficulty walking, decreased ROM, decreased strength, impaired perceived functional ability, improper body mechanics, and pain.   ACTIVITY LIMITATIONS: carrying, lifting, bending, sitting, standing, squatting, sleeping, stairs, transfers, bed mobility, and locomotion level  PERSONAL FACTORS: Age, Fitness, and Past/current experiences are also affecting patient's functional outcome.   REHAB POTENTIAL: Good  CLINICAL DECISION MAKING: Stable/uncomplicated  EVALUATION COMPLEXITY: Low   GOALS: Goals reviewed with patient? No  SHORT TERM GOALS: Target date: 06/10/2023   Patient to demonstrate independence in HEP Baseline: [id number] Goal status: INITIAL  2.  253ft ambulation with LRAD Baseline: 37ft with RW Goal status: INITIAL   LONG TERM GOALS: Target date:  07/01/2023  Patient will score at least 60/80 on LEFS to signify clinically meaningful improvement in functional abilities.   Baseline: 14/80 Goal status: INITIAL  2.  Patient will acknowledge 4/10 pain at least once during episode of care   Baseline: 7/10 Goal status: INITIAL  3.  Patient will increase 30s chair stand reps from 1 to 10 with/without arms to demonstrate and improved functional ability with less pain/difficulty as well as reduce fall risk.  Baseline: 1 Goal status: INITIAL  4.  Increase RLE strength to 4/5 Baseline:  MMT Right eval Left eval  Hip flexion 3   Hip extension 3   Hip abduction 2    Goal status: INITIAL  5.  Increase R hip AROM to 120d flexion, 10d extension Baseline:  Passive ROM Right eval Left eval  Hip flexion 90d   Hip extension 0d    Goal status: INITIAL     PLAN:  PT FREQUENCY: 2x/week  PT DURATION: 6 weeks  PLANNED INTERVENTIONS: 97164- PT Re-evaluation, 97110-Therapeutic exercises, 97530- Therapeutic activity, 97112- Neuromuscular re-education, 97535- Self Care, 47829- Manual therapy, [phone]- Gait training, [phone]- Aquatic Therapy, Patient/Family education, Balance training, Stair training, Dry Needling, and DME instructions  PLAN FOR NEXT SESSION: HEP review and update, manual techniques as appropriate, aerobic tasks, ROM and flexibility activities, strengthening and PREs, TPDN, gait and balance training as needed   For all possible CPT codes, reference the Planned Interventions line above.     Check all conditions that are expected to impact treatment: {Conditions expected to impact treatment:None of these apply   If treatment provided at initial evaluation, no treatment charged due to lack of authorization.  Hildred Laser, PT 05/22/2023, 1:03 PM

## 2023-05-22 ENCOUNTER — Ambulatory Visit

## 2023-05-22 DIAGNOSIS — M25551 Pain in right hip: Secondary | ICD-10-CM

## 2023-05-22 DIAGNOSIS — R262 Difficulty in walking, not elsewhere classified: Secondary | ICD-10-CM

## 2023-05-22 DIAGNOSIS — M6281 Muscle weakness (generalized): Secondary | ICD-10-CM

## 2023-05-28 ENCOUNTER — Ambulatory Visit

## 2023-05-28 DIAGNOSIS — M25551 Pain in right hip: Secondary | ICD-10-CM | POA: Diagnosis not present

## 2023-05-28 DIAGNOSIS — R262 Difficulty in walking, not elsewhere classified: Secondary | ICD-10-CM

## 2023-05-28 DIAGNOSIS — M6281 Muscle weakness (generalized): Secondary | ICD-10-CM

## 2023-05-28 NOTE — Therapy (Signed)
 OUTPATIENT PHYSICAL THERAPY TREATMENT NOTE   Patient Name: Stephen Lee MRN: [id number] DOB:Jan 23, 1991, 33 y.o., male Today's Date: 05/28/2023  END OF SESSION:  PT End of Session - 05/28/23 1205     Visit Number 3    Number of Visits 12    Date for PT Re-Evaluation 07/20/23    Authorization Type [employer]    PT Start Time 1215    PT Stop Time 1255    PT Time Calculation (min) 40 min    Activity Tolerance Patient tolerated treatment well    Behavior During Therapy WFL for tasks assessed/performed             Past Medical History:  Diagnosis Date   Attention deficit    Condyloma acuminatum in male    perianal & anal canal   HIV (human immunodeficiency virus infection) (HCC)    Past Surgical History:  Procedure Laterality Date   TOTAL HIP ARTHROPLASTY Right 05/17/2023   Procedure: ARTHROPLASTY, HIP, TOTAL, ANTERIOR APPROACH;  Surgeon: Gean Birchwood, MD;  Location: [facility];  Service: Orthopedics;  Laterality: Right;  RIGHT TOTAL ANTEIROR HIP ARTHROPLASTY   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   PERTINENT HISTORY: He is scheduled for hip replacement surgery with Dr. [name] due to avascular necrosis, diagnosed after experiencing significant unresolved hip pain. The pain is severe, impacting his ability to walk. Initial evaluations included an x-ray of his back and an ultrasound of his legs, which did not identify the issue. Eventually, x-rays and an MRI confirmed avascular necrosis. The pain is intermittent but severe, affecting his daily activities and work.  PAIN:  Are you having pain? Yes: NPRS scale: 8/10 Pain location: R hip Pain description: ache, sharp, throb Aggravating factors: movement and weightbearing Relieving factors: rest and medication  PRECAUTIONS: Anterior hip  RED FLAGS: None   WEIGHT BEARING RESTRICTIONS:  WBAT  FALLS:  Has patient fallen in last 6 months? No  OCCUPATION: Production designer, theatre/television/film  PLOF: Independent  PATIENT GOALS: To return to my PLOF  NEXT MD VISIT: 05/30/23 with surgeon  OBJECTIVE:  Note: Objective measures were completed at Evaluation unless otherwise noted.  DIAGNOSTIC FINDINGS: none available  PATIENT SURVEYS:  LEFS 14/80   MUSCLE LENGTH: deferrred  POSTURE:  flexed R hip posture  PALPATION: deferred  LOWER EXTREMITY ROM:  Passive ROM Right eval Left eval  Hip flexion 90d   Hip extension 0d   Hip abduction    Hip adduction    Hip internal rotation    Hip external rotation  Knee flexion    Knee extension    Ankle dorsiflexion    Ankle plantarflexion    Ankle inversion    Ankle eversion     (Blank rows = not tested)  LOWER EXTREMITY MMT:  MMT Right eval Left eval  Hip flexion 3   Hip extension 3   Hip abduction 2   Hip adduction    Hip internal rotation    Hip external rotation    Knee flexion    Knee extension    Ankle dorsiflexion    Ankle plantarflexion    Ankle inversion    Ankle eversion     (Blank rows = not  tested)  LOWER EXTREMITY SPECIAL TESTS:  deferred  FUNCTIONAL TESTS:  30 seconds chair stand test 1 rep  GAIT: Distance walked: 44ftx2 Assistive device utilized: Environmental consultant - 2 wheeled Level of assistance: Complete Independence Comments: slow antalgic step to pattern                                                                                                                                TREATMENT DATE:  [facility] Adult PT Treatment:                                                DATE: 05/28/23 Therapeutic Exercise: Nustep level 4 x 6 mins Neuromuscular re-ed: Supine hip fallouts RTB 15x B, 15/15 unilaterally S/L clams R only RTB 15x Bridge against RTB 15x Therapeutic Activity: R hip flexor stretch x1' FAQs with adduction 10x2 Heel/toe at counter x20  [facility] Adult PT Treatment:                                                DATE: 05/22/23  Neuromuscular re-ed: Supine hip fallouts RTB 10x B, 10/10 unilaterally S/L clams R only 10x Bridge against RTB 10x Therapeutic Activity: R hip flexor stretch 30s x2 FAQs with adduction 10x Heel slides with strap 10x Heel/toe in walker 10x    PATIENT EDUCATION:  Education details: Discussed eval findings, rehab rationale and POC and patient is in agreement  Person educated: Patient Education method: Explanation Education comprehension: verbalized understanding and needs further education  HOME EXERCISE PROGRAM: Access Code: [email or web address] URL: [email or web address] Date: 05/20/2023 Prepared by: Jeffrey Ziemba  Exercises - Standing Heel Raise with Support  - 3 x daily - 5 x weekly - 2 sets - 10 reps - Bent Knee Fallouts with Alternating Legs  - 3 x daily - 5 x weekly - 2 sets - 10 reps - Supine Quad Set  - 3 x daily - 5 x weekly - 2 sets - 10 reps - Supine Heel Slide  - 3 x  daily - 5 x weekly - 2 sets - 10 reps  ASSESSMENT:  CLINICAL IMPRESSION:  Patient presents to PT reporting continued improvements in his pain and  function, states the incision is staying clean and dry with no leakage. He is ambulating without FWW today, states he has been doing this for a few days with no issue. He has also stopped prescription pain medication and switched to OTC with no problems. He states he has a follow up appointment with his surgeon on Thursday this week. Session today focused on hip strengthening and ROM, with increased resistance and repetitions today to good effect, no increase in pain throughout session. Attempted SLR on R, unable to lift, but no pain present. Patient was able to tolerate all prescribed exercises with no adverse effects. Patient continues to benefit from skilled PT services and should be progressed as able to improve functional independence.    EVAL: Patient is a 33 y.o. male who was seen today for physical therapy evaluation and treatment s/p R THA. Patient is WBAT ambulating with RW.  R hip mobility limited by tightness/stiffness.  Patient able to transfer I, strength and function deficits evident on 30s chair stand test.  Non removable post op dressing in place w/o drainage.  LEFS score 14/80  OBJECTIVE IMPAIRMENTS: Abnormal gait, decreased activity tolerance, decreased coordination, decreased endurance, decreased knowledge of use of DME, decreased mobility, difficulty walking, decreased ROM, decreased strength, impaired perceived functional ability, improper body mechanics, and pain.   ACTIVITY LIMITATIONS: carrying, lifting, bending, sitting, standing, squatting, sleeping, stairs, transfers, bed mobility, and locomotion level  PERSONAL FACTORS: Age, Fitness, and Past/current experiences are also affecting patient's functional outcome.   REHAB POTENTIAL: Good  CLINICAL DECISION MAKING: Stable/uncomplicated  EVALUATION COMPLEXITY: Low   GOALS: Goals reviewed with patient? No  SHORT TERM GOALS: Target date: 06/10/2023   Patient to demonstrate independence in HEP Baseline: [id number] Goal  status: INITIAL  2.  219ft ambulation with LRAD Baseline: 11ft with RW Goal status: INITIAL   LONG TERM GOALS: Target date: 07/01/2023  Patient will score at least 60/80 on LEFS to signify clinically meaningful improvement in functional abilities.   Baseline: 14/80 Goal status: INITIAL  2.  Patient will acknowledge 4/10 pain at least once during episode of care   Baseline: 7/10 Goal status: INITIAL  3.  Patient will increase 30s chair stand reps from 1 to 10 with/without arms to demonstrate and improved functional ability with less pain/difficulty as well as reduce fall risk.  Baseline: 1 Goal status: INITIAL  4.  Increase RLE strength to 4/5 Baseline:  MMT Right eval Left eval  Hip flexion 3   Hip extension 3   Hip abduction 2    Goal status: INITIAL  5.  Increase R hip AROM to 120d flexion, 10d extension Baseline:  Passive ROM Right eval Left eval  Hip flexion 90d   Hip extension 0d    Goal status: INITIAL     PLAN:  PT FREQUENCY: 2x/week  PT DURATION: 6 weeks  PLANNED INTERVENTIONS: 97164- PT Re-evaluation, 97110-Therapeutic exercises, 97530- Therapeutic activity, 97112- Neuromuscular re-education, 97535- Self Care, 24401- Manual therapy, [phone]- Gait training, [phone]- Aquatic Therapy, Patient/Family education, Balance training, Stair training, Dry Needling, and DME instructions  PLAN FOR NEXT SESSION: HEP review and update, manual techniques as appropriate, aerobic tasks, ROM and flexibility activities, strengthening and PREs, TPDN, gait and balance training as needed   For all possible CPT codes, reference the Planned Interventions line above.  Check all conditions that are expected to impact treatment: {Conditions expected to impact treatment:None of these apply   If treatment provided at initial evaluation, no treatment charged due to lack of authorization.       Anna Kettering, PTA 05/28/2023, 12:55 PM

## 2023-05-30 ENCOUNTER — Ambulatory Visit

## 2023-06-04 ENCOUNTER — Ambulatory Visit

## 2023-06-04 DIAGNOSIS — R262 Difficulty in walking, not elsewhere classified: Secondary | ICD-10-CM

## 2023-06-04 DIAGNOSIS — M25551 Pain in right hip: Secondary | ICD-10-CM | POA: Diagnosis not present

## 2023-06-04 DIAGNOSIS — M6281 Muscle weakness (generalized): Secondary | ICD-10-CM

## 2023-06-04 NOTE — Therapy (Signed)
 OUTPATIENT PHYSICAL THERAPY TREATMENT NOTE   Patient Name: Stephen Lee MRN: [id number] DOB:[birth year], 33 y.o., male Today's Date: 06/04/2023  END OF SESSION:  PT End of Session - 06/04/23 1219     Visit Number 4    Number of Visits 12    Date for PT Re-Evaluation 07/20/23    Authorization Type [employer]    PT Start Time 1215    PT Stop Time 1300    PT Time Calculation (min) 45 min    Activity Tolerance Patient tolerated treatment well    Behavior During Therapy WFL for tasks assessed/performed              Past Medical History:  Diagnosis Date   Attention deficit    Condyloma acuminatum in male    perianal & anal canal   HIV (human immunodeficiency virus infection) (HCC)    Past Surgical History:  Procedure Laterality Date   TOTAL HIP ARTHROPLASTY Right 05/17/2023   Procedure: ARTHROPLASTY, HIP, TOTAL, ANTERIOR APPROACH;  Surgeon: Wendolyn Hamburger, MD;  Location: [facility];  Service: Orthopedics;  Laterality: Right;  RIGHT TOTAL ANTEIROR HIP ARTHROPLASTY   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  Saw MD last week and progressing well.  Return to MD in 1 month.   PERTINENT HISTORY: He is scheduled for hip replacement surgery with Dr. Carry Clapper due to avascular necrosis, diagnosed after experiencing significant unresolved hip pain. The pain is severe, impacting his ability to walk. Initial evaluations included an x-ray of his back and an ultrasound of his legs, which did not identify the issue. Eventually, x-rays and an MRI confirmed avascular necrosis. The pain is intermittent but severe, affecting his daily activities and work.  PAIN:  Are you having pain? Yes: NPRS scale: 8/10 Pain location: R hip Pain description: ache, sharp, throb Aggravating factors: movement and weightbearing Relieving factors: rest and medication  PRECAUTIONS: Anterior hip  RED FLAGS: None   WEIGHT BEARING RESTRICTIONS:  WBAT  FALLS:  Has patient fallen in last 6 months? No  OCCUPATION: Production designer, theatre/television/film  PLOF: Independent  PATIENT GOALS: To return to my PLOF  NEXT MD VISIT: 05/30/23 with surgeon  OBJECTIVE:  Note: Objective measures were completed at Evaluation unless otherwise noted.  DIAGNOSTIC FINDINGS: none available  PATIENT SURVEYS:  LEFS 14/80   MUSCLE LENGTH: deferrred  POSTURE:  flexed R hip posture  PALPATION: deferred  LOWER EXTREMITY ROM:  Passive ROM Right eval Left eval  Hip flexion 90d   Hip extension 0d   Hip abduction    Hip adduction    Hip internal rotation  Hip external rotation    Knee flexion    Knee extension    Ankle dorsiflexion    Ankle plantarflexion    Ankle inversion    Ankle eversion     (Blank rows = not tested)  LOWER EXTREMITY MMT:  MMT Right eval Left eval  Hip flexion 3   Hip extension 3   Hip abduction 2   Hip adduction    Hip internal rotation    Hip external rotation    Knee flexion    Knee extension    Ankle dorsiflexion    Ankle plantarflexion    Ankle inversion    Ankle eversion      (Blank rows = not tested)  LOWER EXTREMITY SPECIAL TESTS:  deferred  FUNCTIONAL TESTS:  30 seconds chair stand test 1 rep  GAIT: Distance walked: 44ftx2 Assistive device utilized: Environmental consultant - 2 wheeled Level of assistance: Complete Independence Comments: slow antalgic step to pattern                                                                                                                                TREATMENT DATE:  [facility] Adult PT Treatment:                                                DATE: 06/04/23 Therapeutic Exercise: Nustep level 4 x 8 mins R hamstring stretch seated 30sx2 R hip flexor stretch 30s x2 Neuromuscular re-ed: Supine hip fallouts GTB 15x B, 15/15 unilaterally S/L clams R only GTB 15x Bridge against GTB 15x Bridge with ball 15x Therapeutic Activity: FAQ with adduction 15x2 STS from airex pad 10x no UE support Heel raise from 4 in step Runner step 4 in 10/10   [facility] Adult PT Treatment:                                                DATE: 05/28/23 Therapeutic Exercise: Nustep level 4 x 6 mins Neuromuscular re-ed: Supine hip fallouts RTB 15x B, 15/15 unilaterally S/L clams R only RTB 15x Bridge against RTB 15x Therapeutic Activity: R hip flexor stretch x1' FAQs with adduction 10x2 Heel/toe at counter x20  [facility] Adult PT Treatment:                                                DATE: 05/22/23  Neuromuscular re-ed: Supine hip fallouts RTB 10x B, 10/10 unilaterally S/L clams R only 10x Bridge against RTB 10x Therapeutic Activity: R hip flexor stretch 30s x2 FAQs with adduction 10x Heel slides  with strap 10x Heel/toe in walker 10x    PATIENT EDUCATION:  Education details: Discussed eval findings, rehab rationale and POC and patient is in agreement  Person educated: Patient Education method: Explanation Education comprehension: verbalized understanding and needs further education  HOME EXERCISE PROGRAM: Access Code: [email or web address] URL:  [email or web address] Date: 05/20/2023 Prepared by: Gretta Leavens  Exercises - Standing Heel Raise with Support  - 3 x daily - 5 x weekly - 2 sets - 10 reps - Bent Knee Fallouts with Alternating Legs  - 3 x daily - 5 x weekly - 2 sets - 10 reps - Supine Quad Set  - 3 x daily - 5 x weekly - 2 sets - 10 reps - Supine Heel Slide  - 3 x daily - 5 x weekly - 2 sets - 10 reps  ASSESSMENT:  CLINICAL IMPRESSION: Today's session focused on strengthening and stretching of R hip.  Incorporated CKC tasks and challenged balance and proprioception.  Increased T-band resistance to green, added compliant surface to STS and began using 4 in step for functional tasks.    EVAL: Patient is a 33 y.o. male who was seen today for physical therapy evaluation and treatment s/p R THA. Patient is WBAT ambulating with RW.  R hip mobility limited by tightness/stiffness.  Patient able to transfer I, strength and function deficits evident on 30s chair stand test.  Non removable post op dressing in place w/o drainage.  LEFS score 14/80  OBJECTIVE IMPAIRMENTS: Abnormal gait, decreased activity tolerance, decreased coordination, decreased endurance, decreased knowledge of use of DME, decreased mobility, difficulty walking, decreased ROM, decreased strength, impaired perceived functional ability, improper body mechanics, and pain.   ACTIVITY LIMITATIONS: carrying, lifting, bending, sitting, standing, squatting, sleeping, stairs, transfers, bed mobility, and locomotion level  PERSONAL FACTORS: Age, Fitness, and Past/current experiences are also affecting patient's functional outcome.   REHAB POTENTIAL: Good  CLINICAL DECISION MAKING: Stable/uncomplicated  EVALUATION COMPLEXITY: Low   GOALS: Goals reviewed with patient? No  SHORT TERM GOALS: Target date: 06/10/2023   Patient to demonstrate independence in HEP Baseline: [id number] Goal status: Met  2.  220ft ambulation with LRAD Baseline: 68ft with RW;  06/04/23 has Dc'ed RW and mobilizing w/o AD Goal status: Met   LONG TERM GOALS: Target date: 07/01/2023  Patient will score at least 60/80 on LEFS to signify clinically meaningful improvement in functional abilities.   Baseline: 14/80 Goal status: INITIAL  2.  Patient will acknowledge 4/10 pain at least once during episode of care   Baseline: 7/10 Goal status: INITIAL  3.  Patient will increase 30s chair stand reps from 1 to 10 with/without arms to demonstrate and improved functional ability with less pain/difficulty as well as reduce fall risk.  Baseline: 1 Goal status: INITIAL  4.  Increase RLE strength to 4/5 Baseline:  MMT Right eval Left eval  Hip flexion 3   Hip extension 3   Hip abduction 2    Goal status: INITIAL  5.  Increase R hip AROM to 120d flexion, 10d extension Baseline:  Passive ROM Right eval Left eval  Hip flexion 90d   Hip extension 0d    Goal status: INITIAL     PLAN:  PT FREQUENCY: 2x/week  PT DURATION: 6 weeks  PLANNED INTERVENTIONS: 97164- PT Re-evaluation, 97110-Therapeutic exercises, 97530- Therapeutic activity, [id number]- Neuromuscular re-education, 97535- Self Care, [id number]- Manual therapy, [id number]- Gait training, [id number]- Aquatic Therapy, Patient/Family education, Balance training, Stair training, Dry Needling, and DME instructions  PLAN FOR NEXT SESSION:  HEP review and update, manual techniques as appropriate, aerobic tasks, ROM and flexibility activities, strengthening and PREs, TPDN, gait and balance training as needed   For all possible CPT codes, reference the Planned Interventions line above.     Check all conditions that are expected to impact treatment: {Conditions expected to impact treatment:None of these apply   If treatment provided at initial evaluation, no treatment charged due to lack of authorization.       Kwadwo Taras M Bianna Haran, PT 06/04/2023, 1:01 PM

## 2023-06-04 NOTE — Therapy (Unsigned)
 OUTPATIENT PHYSICAL THERAPY TREATMENT NOTE   Patient Name: Stephen Lee MRN: [id number] DOB:1990-04-09, 33 y.o., male Today's Date: 06/06/2023  END OF SESSION:  PT End of Session - 06/06/23 1224     Visit Number 5    Number of Visits 12    Date for PT Re-Evaluation 07/20/23    Authorization Type [place] [facility]    Authorization Time Period Auth required AFTER 27th visit    Authorization - Number of Visits 27    PT Start Time 1220    PT Stop Time 1305    PT Time Calculation (min) 45 min    Activity Tolerance Patient tolerated treatment well    Behavior During Therapy WFL for tasks assessed/performed               Past Medical History:  Diagnosis Date   Attention deficit    Condyloma acuminatum in male    perianal & anal canal   HIV (human immunodeficiency virus infection) (HCC)    Past Surgical History:  Procedure Laterality Date   TOTAL HIP ARTHROPLASTY Right 05/17/2023   Procedure: ARTHROPLASTY, HIP, TOTAL, ANTERIOR APPROACH;  Surgeon: Wendolyn Hamburger, MD;  Location: [facility];  Service: Orthopedics;  Laterality: Right;  RIGHT TOTAL ANTEIROR HIP ARTHROPLASTY   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  PERTINENT HISTORY: He is scheduled for hip replacement surgery with Dr. Carry Clapper due to avascular necrosis, diagnosed after experiencing significant unresolved hip pain. The pain is severe, impacting his ability to walk. Initial evaluations included an x-ray of his back and an ultrasound of his legs, which did not identify the issue. Eventually, x-rays and an MRI confirmed avascular necrosis. The pain is intermittent but severe, affecting his daily activities and work.  PAIN:  Are you having pain? Yes: NPRS scale: 8/10 Pain location: R hip Pain description: ache, sharp, throb Aggravating factors: movement and weightbearing Relieving factors: rest and medication  PRECAUTIONS: Anterior hip  RED FLAGS: None   WEIGHT BEARING RESTRICTIONS:  WBAT  FALLS:  Has patient fallen in last 6 months? No  OCCUPATION: Production designer, theatre/television/film  PLOF: Independent  PATIENT GOALS: To return to my PLOF  NEXT MD VISIT: 05/30/23 with surgeon  OBJECTIVE:  Note: Objective measures were completed at Evaluation unless otherwise noted.  DIAGNOSTIC FINDINGS: none available  PATIENT SURVEYS:  LEFS 14/80   MUSCLE LENGTH: deferrred  POSTURE:  flexed R hip posture  PALPATION: deferred  LOWER EXTREMITY ROM:  Passive ROM Right eval Left eval  Hip flexion 90d   Hip extension 0d   Hip abduction    Hip adduction  Hip internal rotation    Hip external rotation    Knee flexion    Knee extension    Ankle dorsiflexion    Ankle plantarflexion    Ankle inversion    Ankle eversion     (Blank rows = not tested)  LOWER EXTREMITY MMT:  MMT Right eval Left eval  Hip flexion 3   Hip extension 3   Hip abduction 2   Hip adduction    Hip internal rotation    Hip external rotation    Knee flexion    Knee extension    Ankle dorsiflexion    Ankle  plantarflexion    Ankle inversion    Ankle eversion     (Blank rows = not tested)  LOWER EXTREMITY SPECIAL TESTS:  deferred  FUNCTIONAL TESTS:  30 seconds chair stand test 1 rep  GAIT: Distance walked: 72ftx2 Assistive device utilized: Environmental consultant - 2 wheeled Level of assistance: Complete Independence Comments: slow antalgic step to pattern                                                                                                                                TREATMENT DATE:  [facility] Adult PT Treatment:                                                DATE: 06/06/23 Therapeutic Exercise: Nustep level 5 x 8 mins R hamstring stretch seated 30sx2 R PKB 30s x2 with strap Neuromuscular re-ed: Supine hip fallouts BluTB 10x B, 10/10 unilaterally S/L clams GTB 10/10 Bridge against BluTB 10x Bridge with ball 15x QS 3s 15x R SLR 15x Therapeutic Activity: FAQ with adduction 15x2 STS from airex pad 10x no UE support 5# KB Heel raise from 4 in step 15x Runner step 4 in 10/10 5# KB 16 steps with reciprocal pattern and minimal need of rails  [facility] Adult PT Treatment:                                                DATE: 06/04/23 Therapeutic Exercise: Nustep level 4 x 8 mins R hamstring stretch seated 30sx2 R hip flexor stretch 30s x2 Neuromuscular re-ed: Supine hip fallouts GTB 15x B, 15/15 unilaterally S/L clams R only GTB 15x Bridge against GTB 15x Bridge with ball 15x Therapeutic Activity: FAQ with adduction 15x2 STS from airex pad 10x no UE support Heel raise from 4 in step 10x Runner step 4 in 10/10   [facility] Adult PT Treatment:  DATE: 05/28/23 Therapeutic Exercise: Nustep level 4 x 6 mins Neuromuscular re-ed: Supine hip fallouts RTB 15x B, 15/15 unilaterally S/L clams R only RTB 15x Bridge against RTB 15x Therapeutic Activity: R hip flexor stretch x1' FAQs with adduction 10x2 Heel/toe at counter x20  [facility] Adult PT Treatment:                                                 DATE: 05/22/23  Neuromuscular re-ed: Supine hip fallouts RTB 10x B, 10/10 unilaterally S/L clams R only 10x Bridge against RTB 10x Therapeutic Activity: R hip flexor stretch 30s x2 FAQs with adduction 10x Heel slides with strap 10x Heel/toe in walker 10x    PATIENT EDUCATION:  Education details: Discussed eval findings, rehab rationale and POC and patient is in agreement  Person educated: Patient Education method: Explanation Education comprehension: verbalized understanding and needs further education  HOME EXERCISE PROGRAM: Access Code: [email or web address] URL: [email or web address] Date: 05/20/2023 Prepared by: Gretta Leavens  Exercises - Standing Heel Raise with Support  - 3 x daily - 5 x weekly - 2 sets - 10 reps - Bent Knee Fallouts with Alternating Legs  - 3 x daily - 5 x weekly - 2 sets - 10 reps - Supine Quad Set  - 3 x daily - 5 x weekly - 2 sets - 10 reps - Supine Heel Slide  - 3 x daily - 5 x weekly - 2 sets - 10 reps  ASSESSMENT:  CLINICAL IMPRESSION: Increased resistance on band exercises, added additional stretching tasks to R quad/hip flexors with PKB.  Added weight to challenge balance with STS and runners step activities.  Reviewed stair negotiation.    EVAL: Patient is a 33 y.o. male who was seen today for physical therapy evaluation and treatment s/p R THA. Patient is WBAT ambulating with RW.  R hip mobility limited by tightness/stiffness.  Patient able to transfer I, strength and function deficits evident on 30s chair stand test.  Non removable post op dressing in place w/o drainage.  LEFS score 14/80  OBJECTIVE IMPAIRMENTS: Abnormal gait, decreased activity tolerance, decreased coordination, decreased endurance, decreased knowledge of use of DME, decreased mobility, difficulty walking, decreased ROM, decreased strength, impaired perceived functional ability, improper body mechanics, and pain.   ACTIVITY  LIMITATIONS: carrying, lifting, bending, sitting, standing, squatting, sleeping, stairs, transfers, bed mobility, and locomotion level  PERSONAL FACTORS: Age, Fitness, and Past/current experiences are also affecting patient's functional outcome.   REHAB POTENTIAL: Good  CLINICAL DECISION MAKING: Stable/uncomplicated  EVALUATION COMPLEXITY: Low   GOALS: Goals reviewed with patient? No  SHORT TERM GOALS: Target date: 06/10/2023   Patient to demonstrate independence in HEP Baseline: [id number] Goal status: Met  2.  26ft ambulation with LRAD Baseline: 21ft with RW; 06/04/23 has Dc'ed RW and mobilizing w/o AD Goal status: Met   LONG TERM GOALS: Target date: 07/01/2023  Patient will score at least 60/80 on LEFS to signify clinically meaningful improvement in functional abilities.   Baseline: 14/80 Goal status: INITIAL  2.  Patient will acknowledge 4/10 pain at least once during episode of care   Baseline: 7/10 Goal status: INITIAL  3.  Patient will increase 30s chair stand reps from 1 to 10 with/without arms to demonstrate and improved functional ability with less pain/difficulty as well as reduce fall risk.  Baseline: 1 Goal status: INITIAL  4.  Increase RLE strength to 4/5 Baseline:  MMT Right eval Left eval  Hip flexion 3   Hip extension 3   Hip abduction 2    Goal status: INITIAL  5.  Increase R hip AROM to 120d flexion, 10d extension Baseline:  Passive ROM Right eval Left eval  Hip flexion 90d   Hip extension 0d    Goal status: INITIAL     PLAN:  PT FREQUENCY: 2x/week  PT DURATION: 6 weeks  PLANNED INTERVENTIONS: 97164- PT Re-evaluation, 97110-Therapeutic exercises, 97530- Therapeutic activity, 97112- Neuromuscular re-education, 97535- Self Care, 16109- Manual therapy, [phone]- Gait training, [phone]- Aquatic Therapy, Patient/Family education, Balance training, Stair training, Dry Needling, and DME instructions  PLAN FOR NEXT SESSION: HEP review and  update, manual techniques as appropriate, aerobic tasks, ROM and flexibility activities, strengthening and PREs, TPDN, gait and balance training as needed   For all possible CPT codes, reference the Planned Interventions line above.     Check all conditions that are expected to impact treatment: {Conditions expected to impact treatment:None of these apply   If treatment provided at initial evaluation, no treatment charged due to lack of authorization.       Tashala Cumbo M Amri Lien, PT 06/06/2023, 1:16 PM

## 2023-06-06 ENCOUNTER — Ambulatory Visit

## 2023-06-06 DIAGNOSIS — M6281 Muscle weakness (generalized): Secondary | ICD-10-CM

## 2023-06-06 DIAGNOSIS — M25551 Pain in right hip: Secondary | ICD-10-CM

## 2023-06-06 DIAGNOSIS — R262 Difficulty in walking, not elsewhere classified: Secondary | ICD-10-CM

## 2023-06-10 NOTE — Therapy (Unsigned)
 OUTPATIENT PHYSICAL THERAPY TREATMENT NOTE   Patient Name: Stephen Lee MRN: [id number] DOB:06/26/1990, 33 y.o., male Today's Date: 06/11/2023  END OF SESSION:  PT End of Session - 06/11/23 1223     Visit Number 6    Number of Visits 12    Date for PT Re-Evaluation 07/20/23    Authorization Type [place] [facility]    Authorization Time Period Auth required AFTER 27th visit    Authorization - Number of Visits 27    PT Start Time 1222    PT Stop Time 1307    PT Time Calculation (min) 45 min    Activity Tolerance Patient tolerated treatment well    Behavior During Therapy WFL for tasks assessed/performed                Past Medical History:  Diagnosis Date   Attention deficit    Condyloma acuminatum in male    perianal & anal canal   HIV (human immunodeficiency virus infection) (HCC)    Past Surgical History:  Procedure Laterality Date   TOTAL HIP ARTHROPLASTY Right 05/17/2023   Procedure: ARTHROPLASTY, HIP, TOTAL, ANTERIOR APPROACH;  Surgeon: Wendolyn Hamburger, MD;  Location: [facility];  Service: Orthopedics;  Laterality: Right;  RIGHT TOTAL ANTEIROR HIP ARTHROPLASTY   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   HIV (human immunodeficiency virus infection) (HCC) 07/19/2021   Condyloma acuminatum in male 10/12/2010    PCP: Arnetha Bhat, NP   REFERRING PROVIDER: Wendolyn Hamburger, MD  REFERRING DIAG: S/P RIGHT THA , PLEASE SCHEDULE PT APPOUNTMENT WITHIN 48 - 72 HOURS OF SURGERY  THERAPY DIAG:  Pain in right hip  Muscle weakness (generalized)  Difficulty in walking, not elsewhere  classified  Rationale for Evaluation and Treatment: Rehabilitation  ONSET DATE: 05/17/23  SUBJECTIVE:   SUBJECTIVE STATEMENT: Continues to do well, still requires rails with stair negotiation  PERTINENT HISTORY: He is scheduled for hip replacement surgery with Dr. Carry Clapper due to avascular necrosis, diagnosed after experiencing significant unresolved hip pain. The pain is severe, impacting his ability to walk. Initial evaluations included an x-ray of his back and an ultrasound of his legs, which did not identify the issue. Eventually, x-rays and an MRI confirmed avascular necrosis. The pain is intermittent but severe, affecting his daily activities and work.  PAIN:  Are you having pain? Yes: NPRS scale: 8/10 Pain location: R hip Pain description: ache, sharp, throb Aggravating factors: movement and weightbearing Relieving factors: rest and medication  PRECAUTIONS: Anterior hip  RED FLAGS: None   WEIGHT BEARING RESTRICTIONS:  WBAT  FALLS:  Has patient fallen in last 6 months? No  OCCUPATION: Production designer, theatre/television/film  PLOF: Independent  PATIENT GOALS: To return to my PLOF  NEXT MD VISIT: 05/30/23 with surgeon  OBJECTIVE:  Note: Objective measures were completed at Evaluation unless otherwise noted.  DIAGNOSTIC FINDINGS: none available  PATIENT SURVEYS:  LEFS 14/80   MUSCLE LENGTH: deferrred  POSTURE:  flexed R hip posture  PALPATION: deferred  LOWER EXTREMITY ROM:  Passive ROM Right eval Left eval  Hip flexion 90d   Hip extension 0d   Hip abduction  Hip adduction    Hip internal rotation    Hip external rotation    Knee flexion    Knee extension    Ankle dorsiflexion    Ankle plantarflexion    Ankle inversion    Ankle eversion     (Blank rows = not tested)  LOWER EXTREMITY MMT:  MMT Right eval Left eval  Hip flexion 3   Hip extension 3   Hip abduction 2   Hip adduction    Hip internal rotation    Hip external rotation    Knee flexion    Knee extension     Ankle dorsiflexion    Ankle plantarflexion    Ankle inversion    Ankle eversion     (Blank rows = not tested)  LOWER EXTREMITY SPECIAL TESTS:  deferred  FUNCTIONAL TESTS:  30 seconds chair stand test 1 rep  GAIT: Distance walked: 61ftx2 Assistive device utilized: Environmental consultant - 2 wheeled Level of assistance: Complete Independence Comments: slow antalgic step to pattern                                                                                                                                TREATMENT DATE:  [facility] Adult PT Treatment:                                                DATE: 06/11/23 Therapeutic Exercise: Nustep level 6 x 8 mins R hamstring stretch seated 30sx2 R PKB 30s x2 with strap Neuromuscular re-ed: Supine hip fallouts BluTB 15x B, 15/15 unilaterally S/L clams GTB 15/15 Bridge against BluTB 15x Bridge with ball 15x R SLR 15x 2# Therapeutic Activity: FAQ with adduction 15x Farmers carry 10# KB 272ft x2 alternating UEs Runner step 4 in 10/10 10# KB Curl ups with p-ball 15x B, 15/15 unilaterally   [facility] Adult PT Treatment:                                                DATE: 06/06/23 Therapeutic Exercise: Nustep level 5 x 8 mins R hamstring stretch seated 30sx2 R PKB 30s x2 with strap Neuromuscular re-ed: Supine hip fallouts BluTB 10x B, 10/10 unilaterally S/L clams GTB 10/10 Bridge against BluTB 10x Bridge with ball 15x QS 3s 15x R SLR 15x Therapeutic Activity: FAQ with adduction 15x2 STS from airex pad 10x no UE support 5# KB Heel raise from 4 in step 15x Runner step 4 in 10/10 5# KB 16 steps with reciprocal pattern and minimal need of rails  [facility] Adult PT Treatment:  DATE: 06/04/23 Therapeutic Exercise: Nustep level 4 x 8 mins R hamstring stretch seated 30sx2 R hip flexor stretch 30s x2 Neuromuscular re-ed: Supine hip fallouts GTB 15x B, 15/15 unilaterally S/L clams R only GTB 15x Bridge against  GTB 15x Bridge with ball 15x Therapeutic Activity: FAQ with adduction 15x2 STS from airex pad 10x no UE support Heel raise from 4 in step 10x Runner step 4 in 10/10   [facility] Adult PT Treatment:                                                DATE: 05/28/23 Therapeutic Exercise: Nustep level 4 x 6 mins Neuromuscular re-ed: Supine hip fallouts RTB 15x B, 15/15 unilaterally S/L clams R only RTB 15x Bridge against RTB 15x Therapeutic Activity: R hip flexor stretch x1' FAQs with adduction 10x2 Heel/toe at counter x20  [facility] Adult PT Treatment:                                                DATE: 05/22/23  Neuromuscular re-ed: Supine hip fallouts RTB 10x B, 10/10 unilaterally S/L clams R only 10x Bridge against RTB 10x Therapeutic Activity: R hip flexor stretch 30s x2 FAQs with adduction 10x Heel slides with strap 10x Heel/toe in walker 10x    PATIENT EDUCATION:  Education details: Discussed eval findings, rehab rationale and POC and patient is in agreement  Person educated: Patient Education method: Explanation Education comprehension: verbalized understanding and needs further education  HOME EXERCISE PROGRAM: Access Code: [email or web address] URL: [email or web address] Date: 05/20/2023 Prepared by: Lenon Kuennen  Exercises - Standing Heel Raise with Support  - 3 x daily - 5 x weekly - 2 sets - 10 reps - Bent Knee Fallouts with Alternating Legs  - 3 x daily - 5 x weekly - 2 sets - 10 reps - Supine Quad Set  - 3 x daily - 5 x weekly - 2 sets - 10 reps - Supine Heel Slide  - 3 x daily - 5 x weekly - 2 sets - 10 reps  ASSESSMENT:  CLINICAL IMPRESSION: Continued to challenge patient with added resistance and reps.  Incorporated farmers carry to address weakness in R hip abductors.  Added additional core tasks to address weakness.   EVAL: Patient is a 33 y.o. male who was seen today for physical therapy evaluation and treatment s/p R THA. Patient is WBAT ambulating with  RW.  R hip mobility limited by tightness/stiffness.  Patient able to transfer I, strength and function deficits evident on 30s chair stand test.  Non removable post op dressing in place w/o drainage.  LEFS score 14/80  OBJECTIVE IMPAIRMENTS: Abnormal gait, decreased activity tolerance, decreased coordination, decreased endurance, decreased knowledge of use of DME, decreased mobility, difficulty walking, decreased ROM, decreased strength, impaired perceived functional ability, improper body mechanics, and pain.   ACTIVITY LIMITATIONS: carrying, lifting, bending, sitting, standing, squatting, sleeping, stairs, transfers, bed mobility, and locomotion level  PERSONAL FACTORS: Age, Fitness, and Past/current experiences are also affecting patient's functional outcome.   REHAB POTENTIAL: Good  CLINICAL DECISION MAKING: Stable/uncomplicated  EVALUATION COMPLEXITY: Low   GOALS: Goals reviewed with patient? No  SHORT TERM GOALS: Target date: 06/10/2023   Patient to demonstrate independence in  HEP Baseline: [id number] Goal status: Met  2.  233ft ambulation with LRAD Baseline: 57ft with RW; 06/04/23 has Dc'ed RW and mobilizing w/o AD Goal status: Met   LONG TERM GOALS: Target date: 07/01/2023  Patient will score at least 60/80 on LEFS to signify clinically meaningful improvement in functional abilities.   Baseline: 14/80 Goal status: INITIAL  2.  Patient will acknowledge 4/10 pain at least once during episode of care   Baseline: 7/10 Goal status: INITIAL  3.  Patient will increase 30s chair stand reps from 1 to 10 with/without arms to demonstrate and improved functional ability with less pain/difficulty as well as reduce fall risk.  Baseline: 1 Goal status: INITIAL  4.  Increase RLE strength to 4/5 Baseline:  MMT Right eval Left eval  Hip flexion 3   Hip extension 3   Hip abduction 2    Goal status: INITIAL  5.  Increase R hip AROM to 120d flexion, 10d extension Baseline:   Passive ROM Right eval Left eval  Hip flexion 90d   Hip extension 0d    Goal status: INITIAL     PLAN:  PT FREQUENCY: 2x/week  PT DURATION: 6 weeks  PLANNED INTERVENTIONS: 97164- PT Re-evaluation, 97110-Therapeutic exercises, 97530- Therapeutic activity, 97112- Neuromuscular re-education, 97535- Self Care, 62130- Manual therapy, [phone]- Gait training, [phone]- Aquatic Therapy, Patient/Family education, Balance training, Stair training, Dry Needling, and DME instructions  PLAN FOR NEXT SESSION: HEP review and update, manual techniques as appropriate, aerobic tasks, ROM and flexibility activities, strengthening and PREs, TPDN, gait and balance training as needed   For all possible CPT codes, reference the Planned Interventions line above.     Check all conditions that are expected to impact treatment: {Conditions expected to impact treatment:None of these apply   If treatment provided at initial evaluation, no treatment charged due to lack of authorization.       Symir Mah M Demarco Bacci, PT 06/11/2023, 1:13 PM

## 2023-06-11 ENCOUNTER — Ambulatory Visit

## 2023-06-11 DIAGNOSIS — M6281 Muscle weakness (generalized): Secondary | ICD-10-CM

## 2023-06-11 DIAGNOSIS — M25551 Pain in right hip: Secondary | ICD-10-CM

## 2023-06-11 DIAGNOSIS — R262 Difficulty in walking, not elsewhere classified: Secondary | ICD-10-CM

## 2023-06-24 ENCOUNTER — Ambulatory Visit: Admitting: Physical Therapy

## 2023-06-27 ENCOUNTER — Ambulatory Visit: Admitting: Physical Therapy

## 2023-06-27 NOTE — Therapy (Deleted)
 OUTPATIENT PHYSICAL THERAPY TREATMENT NOTE   Patient Name: Stephen Lee MRN: [id number] DOB:1990/06/09, 33 y.o., male Today's Date: 06/27/2023  END OF SESSION:       Past Medical History:  Diagnosis Date   Attention deficit    Condyloma acuminatum in male    perianal & anal canal   HIV (human immunodeficiency virus infection) (HCC)    Past Surgical History:  Procedure Laterality Date   TOTAL HIP ARTHROPLASTY Right 05/17/2023   Procedure: ARTHROPLASTY, HIP, TOTAL, ANTERIOR APPROACH;  Surgeon: Wendolyn Hamburger, MD;  Location: [facility];  Service: Orthopedics;  Laterality: Right;  RIGHT TOTAL ANTEIROR HIP ARTHROPLASTY   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  Rationale for Evaluation and Treatment: Rehabilitation  ONSET DATE: 05/17/23  SUBJECTIVE:   SUBJECTIVE STATEMENT: Continues to do well, still requires rails with stair negotiation  PERTINENT HISTORY: He is scheduled for hip replacement surgery with Dr. Carry Clapper due to avascular necrosis, diagnosed after experiencing significant unresolved hip pain. The pain is severe, impacting his ability to walk. Initial evaluations included an x-ray of his back and an ultrasound of his legs, which did not identify the issue. Eventually, x-rays and an  MRI confirmed avascular necrosis. The pain is intermittent but severe, affecting his daily activities and work.  PAIN:  Are you having pain? Yes: NPRS scale: 8/10 Pain location: R hip Pain description: ache, sharp, throb Aggravating factors: movement and weightbearing Relieving factors: rest and medication  PRECAUTIONS: Anterior hip  RED FLAGS: None   WEIGHT BEARING RESTRICTIONS: WBAT  FALLS:  Has patient fallen in last 6 months? No  OCCUPATION: Production designer, theatre/television/film  PLOF: Independent  PATIENT GOALS: To return to my PLOF  NEXT MD VISIT: 05/30/23 with surgeon  OBJECTIVE:  Note: Objective measures were completed at Evaluation unless otherwise noted.  DIAGNOSTIC FINDINGS: none available  PATIENT SURVEYS:  LEFS 14/80   MUSCLE LENGTH: deferrred  POSTURE: flexed R hip posture  PALPATION: deferred  LOWER EXTREMITY ROM:  Passive ROM Right eval Left eval  Hip flexion 90d   Hip extension 0d   Hip abduction    Hip adduction    Hip internal rotation    Hip external rotation    Knee flexion    Knee extension    Ankle dorsiflexion    Ankle plantarflexion    Ankle inversion    Ankle eversion     (Blank rows = not tested)  LOWER EXTREMITY MMT:  MMT Right eval Left eval  Hip flexion 3   Hip extension 3   Hip abduction 2   Hip adduction    Hip internal rotation    Hip external rotation    Knee flexion    Knee extension    Ankle dorsiflexion    Ankle plantarflexion    Ankle inversion  Ankle eversion     (Blank rows = not tested)  LOWER EXTREMITY SPECIAL TESTS:  deferred  FUNCTIONAL TESTS:  30 seconds chair stand test 1 rep  GAIT: Distance walked: 58ftx2 Assistive device utilized: Environmental consultant - 2 wheeled Level of assistance: Complete Independence Comments: slow antalgic step to pattern                                                                                                                                TREATMENT DATE:  [facility] Adult PT Treatment:                                                 DATE: 06/11/23 Therapeutic Exercise: Nustep level 6 x 8 mins R hamstring stretch seated 30sx2 R PKB 30s x2 with strap Neuromuscular re-ed: Supine hip fallouts BluTB 15x B, 15/15 unilaterally S/L clams GTB 15/15 Bridge against BluTB 15x Bridge with ball 15x R SLR 15x 2# Therapeutic Activity: FAQ with adduction 15x Farmers carry 10# KB 245ft x2 alternating UEs Runner step 4 in 10/10 10# KB Curl ups with p-ball 15x B, 15/15 unilaterally       PATIENT EDUCATION:  Education details: Discussed eval findings, rehab rationale and POC and patient is in agreement  Person educated: Patient Education method: Explanation Education comprehension: verbalized understanding and needs further education  HOME EXERCISE PROGRAM: Access Code: [email or web address] URL: [email or web address] Date: 05/20/2023 Prepared by: Jeffrey Ziemba  Exercises - Standing Heel Raise with Support  - 3 x daily - 5 x weekly - 2 sets - 10 reps - Bent Knee Fallouts with Alternating Legs  - 3 x daily - 5 x weekly - 2 sets - 10 reps - Supine Quad Set  - 3 x daily - 5 x weekly - 2 sets - 10 reps - Supine Heel Slide  - 3 x daily - 5 x weekly - 2 sets - 10 reps  ASSESSMENT:  CLINICAL IMPRESSION: Continued to challenge patient with added resistance and reps.  Incorporated farmers carry to address weakness in R hip abductors.  Added additional core tasks to address weakness.   EVAL: Patient is a 33 y.o. male who was seen today for physical therapy evaluation and treatment s/p R THA. Patient is WBAT ambulating with RW.  R hip mobility limited by tightness/stiffness.  Patient able to transfer I, strength and function deficits evident on 30s chair stand test.  Non removable post op dressing in place w/o drainage.  LEFS score 14/80  OBJECTIVE IMPAIRMENTS: Abnormal gait, decreased activity tolerance, decreased coordination, decreased endurance, decreased knowledge of use of DME,  decreased mobility, difficulty walking, decreased ROM, decreased strength, impaired perceived functional ability, improper body mechanics, and pain.   ACTIVITY LIMITATIONS: carrying, lifting, bending, sitting, standing, squatting, sleeping, stairs, transfers, bed mobility, and locomotion level  PERSONAL  FACTORS: Age, Fitness, and Past/current experiences are also affecting patient's functional outcome.   REHAB POTENTIAL: Good  CLINICAL DECISION MAKING: Stable/uncomplicated  EVALUATION COMPLEXITY: Low   GOALS: Goals reviewed with patient? No  SHORT TERM GOALS: Target date: 06/10/2023   Patient to demonstrate independence in HEP Baseline: [id number] Goal status: Met  2.  248ft ambulation with LRAD Baseline: 53ft with RW; 06/04/23 has Dc'ed RW and mobilizing w/o AD Goal status: Met   LONG TERM GOALS: Target date: 07/01/2023  Patient will score at least 60/80 on LEFS to signify clinically meaningful improvement in functional abilities.   Baseline: 14/80 Goal status: INITIAL  2.  Patient will acknowledge 4/10 pain at least once during episode of care   Baseline: 7/10 Goal status: INITIAL  3.  Patient will increase 30s chair stand reps from 1 to 10 with/without arms to demonstrate and improved functional ability with less pain/difficulty as well as reduce fall risk.  Baseline: 1 Goal status: INITIAL  4.  Increase RLE strength to 4/5 Baseline:  MMT Right eval Left eval  Hip flexion 3   Hip extension 3   Hip abduction 2    Goal status: INITIAL  5.  Increase R hip AROM to 120d flexion, 10d extension Baseline:  Passive ROM Right eval Left eval  Hip flexion 90d   Hip extension 0d    Goal status: INITIAL     PLAN:  PT FREQUENCY: 2x/week  PT DURATION: 6 weeks  PLANNED INTERVENTIONS: 97164- PT Re-evaluation, 97110-Therapeutic exercises, 97530- Therapeutic activity, 97112- Neuromuscular re-education, 97535- Self Care, 08657- Manual therapy, [phone]- Gait training,  [phone]- Aquatic Therapy, Patient/Family education, Balance training, Stair training, Dry Needling, and DME instructions  PLAN FOR NEXT SESSION: HEP review and update, manual techniques as appropriate, aerobic tasks, ROM and flexibility activities, strengthening and PREs, TPDN, gait and balance training as needed   For all possible CPT codes, reference the Planned Interventions line above.     Check all conditions that are expected to impact treatment: {Conditions expected to impact treatment:None of these apply   If treatment provided at initial evaluation, no treatment charged due to lack of authorization.       Bunny Caroli, PT 06/27/2023, 8:09 AM

## 2023-07-01 NOTE — Therapy (Deleted)
 OUTPATIENT PHYSICAL THERAPY TREATMENT NOTE   Patient Name: Stephen Lee MRN: [id number] DOB:[birth year], 33 y.o., male Today's Date: 07/01/2023  END OF SESSION:       Past Medical History:  Diagnosis Date   Attention deficit    Condyloma acuminatum in male    perianal & anal canal   HIV (human immunodeficiency virus infection) (HCC)    Past Surgical History:  Procedure Laterality Date   TOTAL HIP ARTHROPLASTY Right 05/17/2023   Procedure: ARTHROPLASTY, HIP, TOTAL, ANTERIOR APPROACH;  Surgeon: Wendolyn Hamburger, MD;  Location: [facility];  Service: Orthopedics;  Laterality: Right;  RIGHT TOTAL ANTEIROR HIP ARTHROPLASTY   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  Rationale for Evaluation and Treatment: Rehabilitation  ONSET DATE: 05/17/23  SUBJECTIVE:   SUBJECTIVE STATEMENT: Continues to do well, still requires rails with stair negotiation  PERTINENT HISTORY: He is scheduled for hip replacement surgery with Dr. Carry Clapper due to avascular necrosis, diagnosed after experiencing significant unresolved hip pain. The pain is severe, impacting his ability to walk. Initial evaluations included an x-ray of his back and an ultrasound of his legs, which did not identify the issue. Eventually, x-rays and an  MRI confirmed avascular necrosis. The pain is intermittent but severe, affecting his daily activities and work.  PAIN:  Are you having pain? Yes: NPRS scale: 8/10 Pain location: R hip Pain description: ache, sharp, throb Aggravating factors: movement and weightbearing Relieving factors: rest and medication  PRECAUTIONS: Anterior hip  RED FLAGS: None   WEIGHT BEARING RESTRICTIONS: WBAT  FALLS:  Has patient fallen in last 6 months? No  OCCUPATION: Production designer, theatre/television/film  PLOF: Independent  PATIENT GOALS: To return to my PLOF  NEXT MD VISIT: 05/30/23 with surgeon  OBJECTIVE:  Note: Objective measures were completed at Evaluation unless otherwise noted.  DIAGNOSTIC FINDINGS: none available  PATIENT SURVEYS:  LEFS 14/80   MUSCLE LENGTH: deferrred  POSTURE: flexed R hip posture  PALPATION: deferred  LOWER EXTREMITY ROM:  Passive ROM Right eval Left eval  Hip flexion 90d   Hip extension 0d   Hip abduction    Hip adduction    Hip internal rotation    Hip external rotation    Knee flexion    Knee extension    Ankle dorsiflexion    Ankle plantarflexion    Ankle inversion    Ankle eversion     (Blank rows = not tested)  LOWER EXTREMITY MMT:  MMT Right eval Left eval  Hip flexion 3   Hip extension 3   Hip abduction 2   Hip adduction    Hip internal rotation    Hip external rotation    Knee flexion    Knee extension    Ankle dorsiflexion    Ankle plantarflexion    Ankle inversion  Ankle eversion     (Blank rows = not tested)  LOWER EXTREMITY SPECIAL TESTS:  deferred  FUNCTIONAL TESTS:  30 seconds chair stand test 1 rep  GAIT: Distance walked: 35ftx2 Assistive device utilized: Environmental consultant - 2 wheeled Level of assistance: Complete Independence Comments: slow antalgic step to pattern                                                                                                                                TREATMENT DATE:  [facility] Adult PT Treatment:                                                 DATE: 06/11/23 Therapeutic Exercise: Nustep level 6 x 8 mins R hamstring stretch seated 30sx2 R PKB 30s x2 with strap Neuromuscular re-ed: Supine hip fallouts BluTB 15x B, 15/15 unilaterally S/L clams GTB 15/15 Bridge against BluTB 15x Bridge with ball 15x R SLR 15x 2# Therapeutic Activity: FAQ with adduction 15x Farmers carry 10# KB 228ft x2 alternating UEs Runner step 4 in 10/10 10# KB Curl ups with p-ball 15x B, 15/15 unilaterally       PATIENT EDUCATION:  Education details: Discussed eval findings, rehab rationale and POC and patient is in agreement  Person educated: Patient Education method: Explanation Education comprehension: verbalized understanding and needs further education  HOME EXERCISE PROGRAM: Access Code: [email or web address] URL: [email or web address] Date: 05/20/2023 Prepared by: Yafet Cline  Exercises - Standing Heel Raise with Support  - 3 x daily - 5 x weekly - 2 sets - 10 reps - Bent Knee Fallouts with Alternating Legs  - 3 x daily - 5 x weekly - 2 sets - 10 reps - Supine Quad Set  - 3 x daily - 5 x weekly - 2 sets - 10 reps - Supine Heel Slide  - 3 x daily - 5 x weekly - 2 sets - 10 reps  ASSESSMENT:  CLINICAL IMPRESSION: Continued to challenge patient with added resistance and reps.  Incorporated farmers carry to address weakness in R hip abductors.  Added additional core tasks to address weakness.   EVAL: Patient is a 33 y.o. male who was seen today for physical therapy evaluation and treatment s/p R THA. Patient is WBAT ambulating with RW.  R hip mobility limited by tightness/stiffness.  Patient able to transfer I, strength and function deficits evident on 30s chair stand test.  Non removable post op dressing in place w/o drainage.  LEFS score 14/80  OBJECTIVE IMPAIRMENTS: Abnormal gait, decreased activity tolerance, decreased coordination, decreased endurance, decreased knowledge of use of DME,  decreased mobility, difficulty walking, decreased ROM, decreased strength, impaired perceived functional ability, improper body mechanics, and pain.   ACTIVITY LIMITATIONS: carrying, lifting, bending, sitting, standing, squatting, sleeping, stairs, transfers, bed mobility, and locomotion level  PERSONAL  FACTORS: Age, Fitness, and Past/current experiences are also affecting patient's functional outcome.   REHAB POTENTIAL: Good  CLINICAL DECISION MAKING: Stable/uncomplicated  EVALUATION COMPLEXITY: Low   GOALS: Goals reviewed with patient? No  SHORT TERM GOALS: Target date: 06/10/2023   Patient to demonstrate independence in HEP Baseline: [id number] Goal status: Met  2.  238ft ambulation with LRAD Baseline: 15ft with RW; 06/04/23 has Dc'ed RW and mobilizing w/o AD Goal status: Met   LONG TERM GOALS: Target date: 07/01/2023  Patient will score at least 60/80 on LEFS to signify clinically meaningful improvement in functional abilities.   Baseline: 14/80 Goal status: INITIAL  2.  Patient will acknowledge 4/10 pain at least once during episode of care   Baseline: 7/10 Goal status: INITIAL  3.  Patient will increase 30s chair stand reps from 1 to 10 with/without arms to demonstrate and improved functional ability with less pain/difficulty as well as reduce fall risk.  Baseline: 1 Goal status: INITIAL  4.  Increase RLE strength to 4/5 Baseline:  MMT Right eval Left eval  Hip flexion 3   Hip extension 3   Hip abduction 2    Goal status: INITIAL  5.  Increase R hip AROM to 120d flexion, 10d extension Baseline:  Passive ROM Right eval Left eval  Hip flexion 90d   Hip extension 0d    Goal status: INITIAL     PLAN:  PT FREQUENCY: 2x/week  PT DURATION: 6 weeks  PLANNED INTERVENTIONS: 97164- PT Re-evaluation, 97110-Therapeutic exercises, 97530- Therapeutic activity, 97112- Neuromuscular re-education, 97535- Self Care, 30865- Manual therapy, [phone]- Gait training,  [phone]- Aquatic Therapy, Patient/Family education, Balance training, Stair training, Dry Needling, and DME instructions  PLAN FOR NEXT SESSION: HEP review and update, manual techniques as appropriate, aerobic tasks, ROM and flexibility activities, strengthening and PREs, TPDN, gait and balance training as needed   For all possible CPT codes, reference the Planned Interventions line above.     Check all conditions that are expected to impact treatment: {Conditions expected to impact treatment:None of these apply   If treatment provided at initial evaluation, no treatment charged due to lack of authorization.       Kalyn Hofstra M Emmakate Hypes, PT 07/01/2023, 8:20 AM

## 2023-07-02 ENCOUNTER — Ambulatory Visit

## 2023-07-04 ENCOUNTER — Ambulatory Visit: Admitting: Physical Therapy

## 2023-10-19 IMAGING — CR DG CHEST 2V
2 series · 2 of 2 positions shown · non-contrast
Comparison: Radiograph and CT 05/21/2021

CLINICAL DATA: Dyspnea. Shortness of breath and chest tightness.
Oxygen requirement.

EXAM:
CHEST - 2 VIEW

[chest pa]
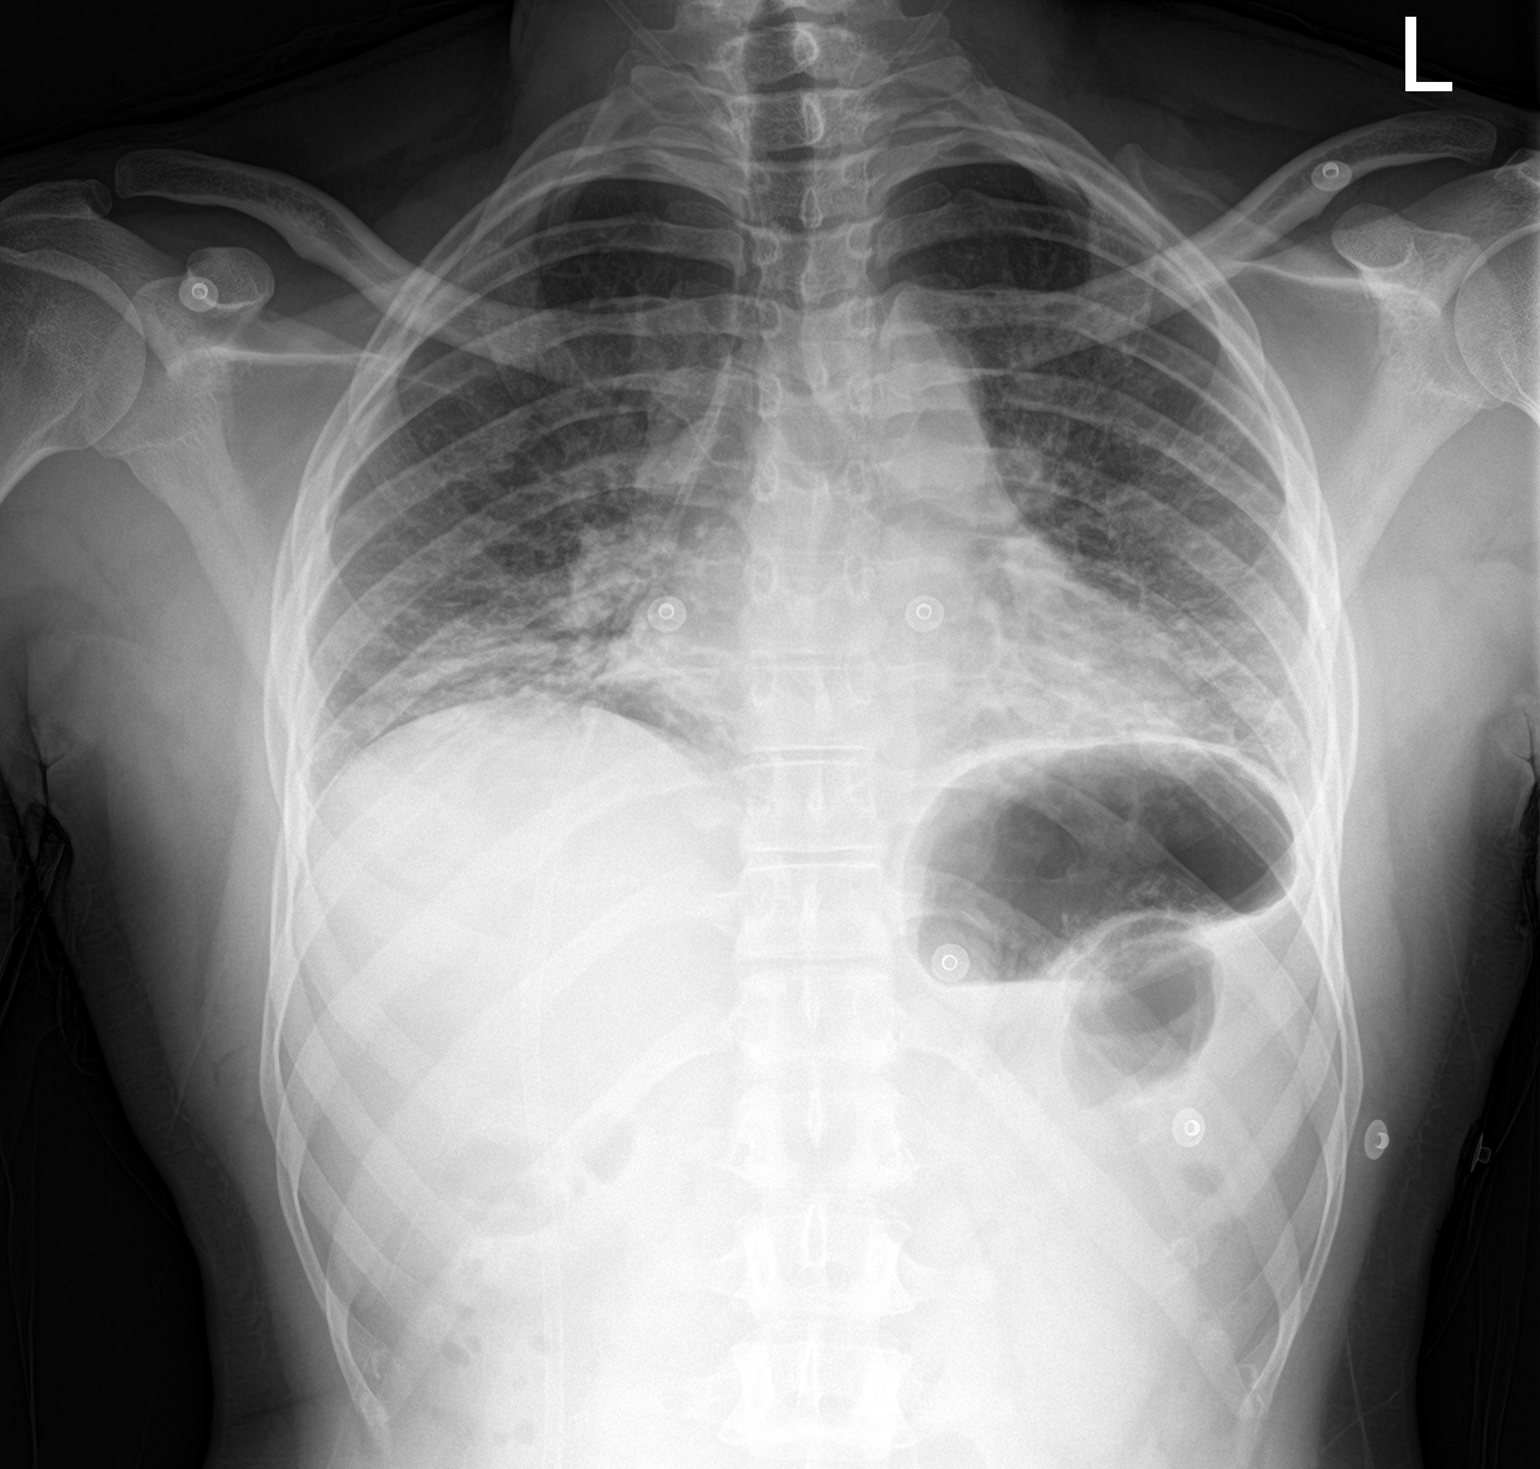

[chest lat]
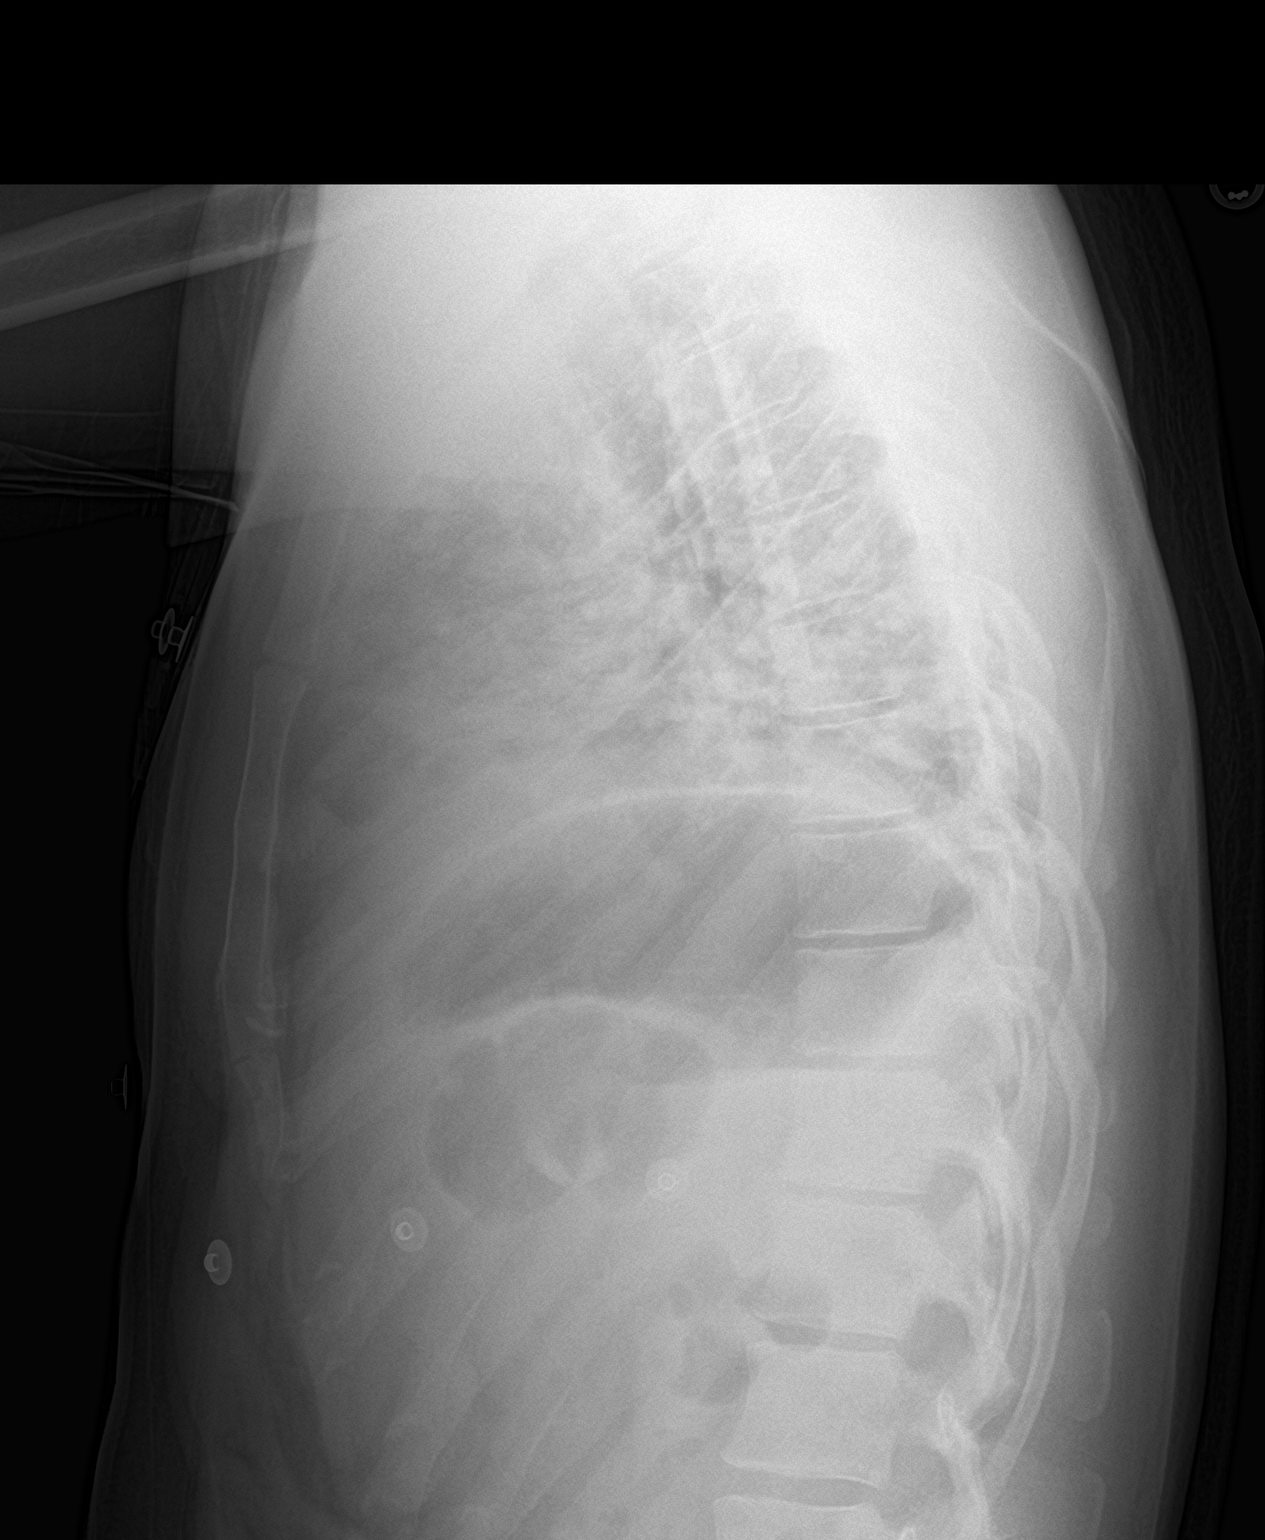

[2 of 2 positions shown; findings below may reference images not displayed]

FINDINGS: Low lung volumes. Progressive min patchy bibasilar airspace disease
from prior imaging. The heart is normal in size with normal
mediastinal contours. There is no pleural effusion or pneumothorax.
No acute osseous abnormalities. Incidental bilateral cervical ribs.
IMPRESSION: Low lung volumes with progressive patchy bibasilar airspace disease
from prior imaging, suspicious for pneumonia.

## 2023-10-20 IMAGING — CT CT ANGIO CHEST
2 of 6 series · 18 of 36 positions shown · IV contrast (agent unspecified)
Comparison: Chest x-ray from earlier in the same day.

CLINICAL DATA: Chest tightness and shortness of breath with
productive cough, initial encounter

EXAM:
CT ANGIOGRAPHY CHEST WITH CONTRAST
TECHNIQUE: Multidetector CT imaging of the chest was performed using the
standard protocol during bolus administration of intravenous
contrast. Multiplanar CT image reconstructions and MIPs were
obtained to evaluate the vascular anatomy.

[Series 8: pe thins · axial · 0.69mm/px · z∈[+1324,+1538]mm · 17 of 340 slices shown]
[im 17/340  lung]
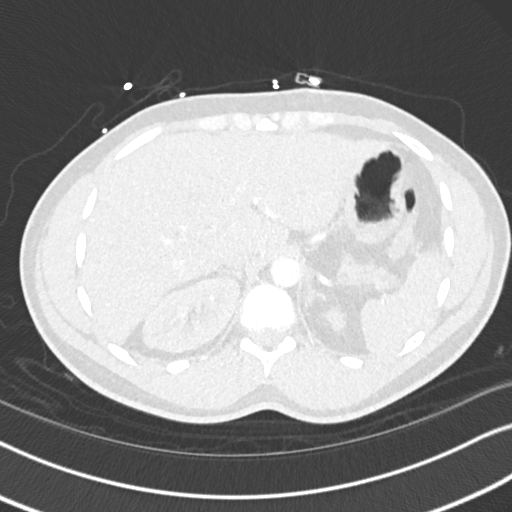
[im 34/340  mediastinal]
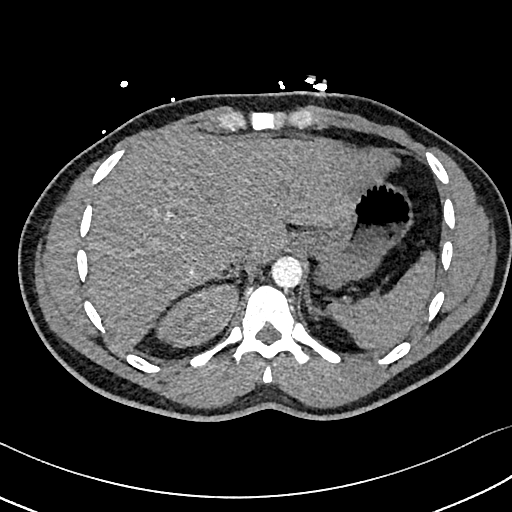
[im 51/340  lung]
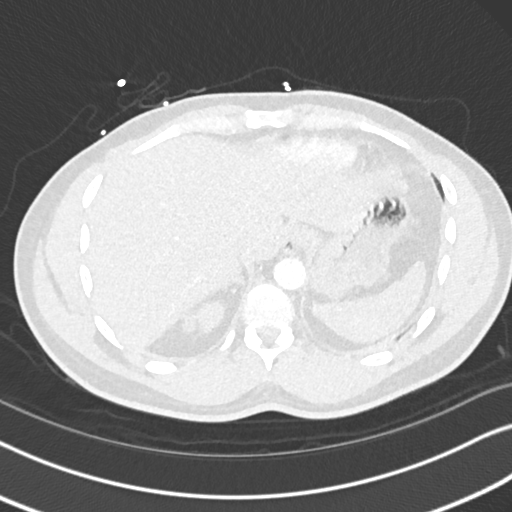
[im 68/340  mediastinal]
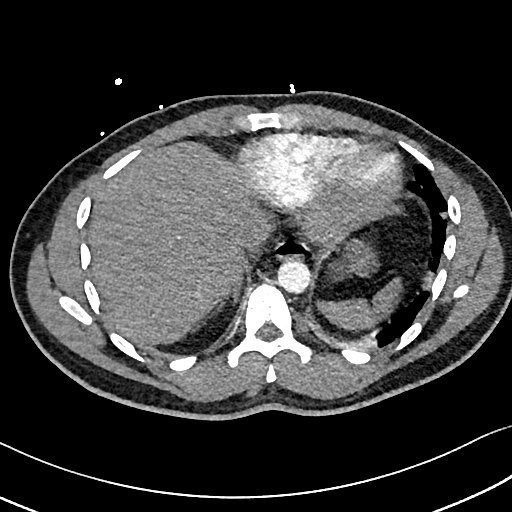
[im 102/340  lung]
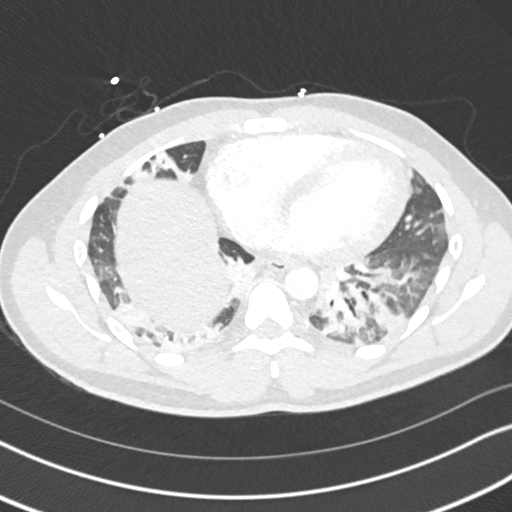
[im 119/340  mediastinal]
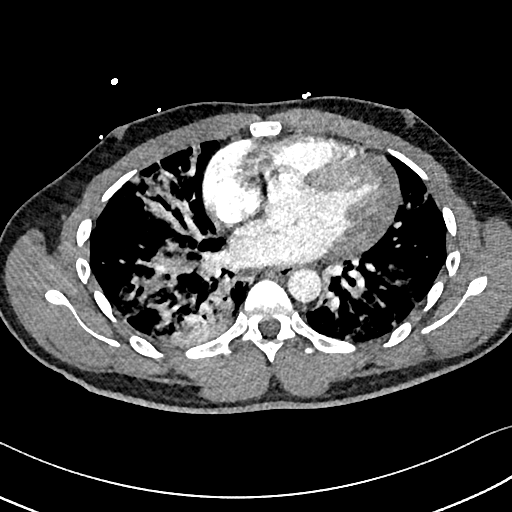
[im 136/340  lung]
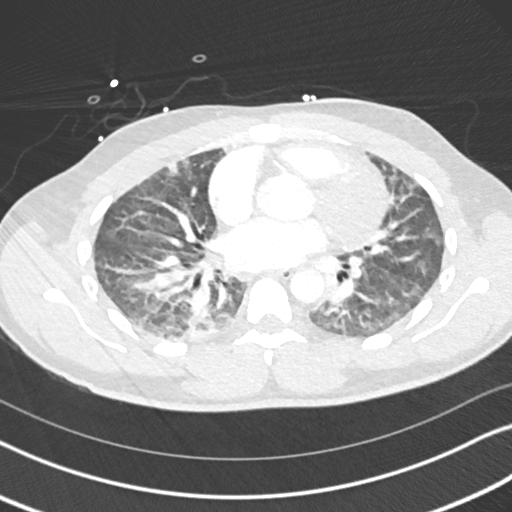
[im 153/340  mediastinal]
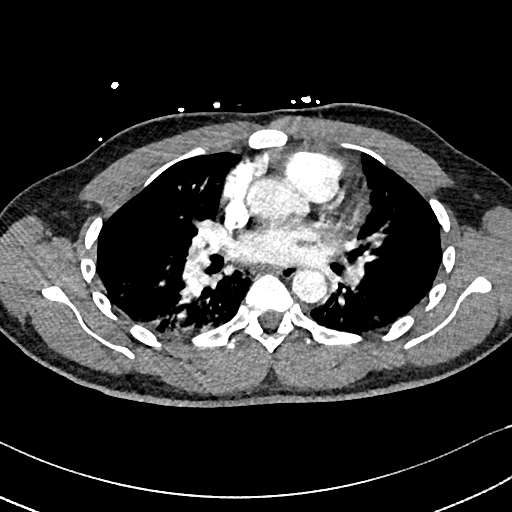
[im 170/340  lung]
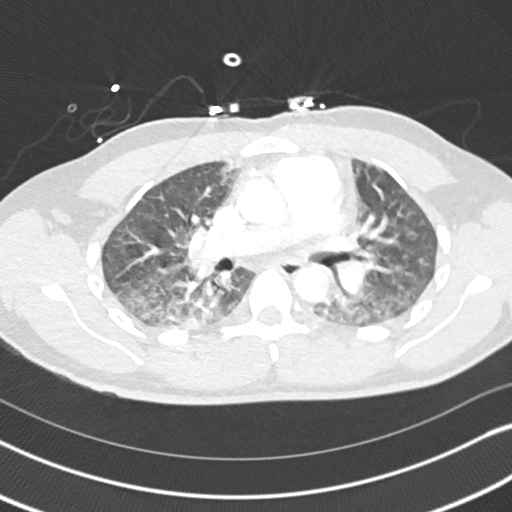
[im 187/340  mediastinal]
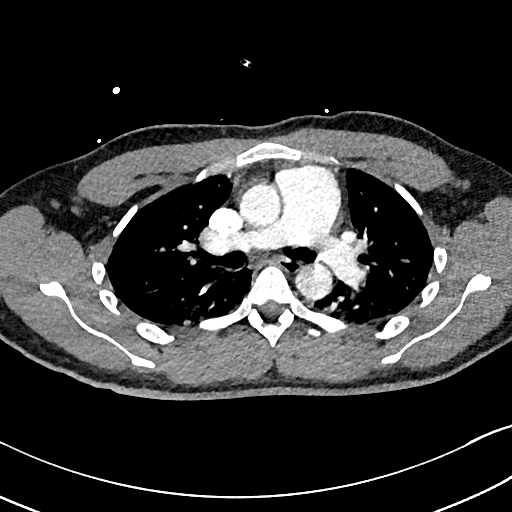
[im 204/340  lung]
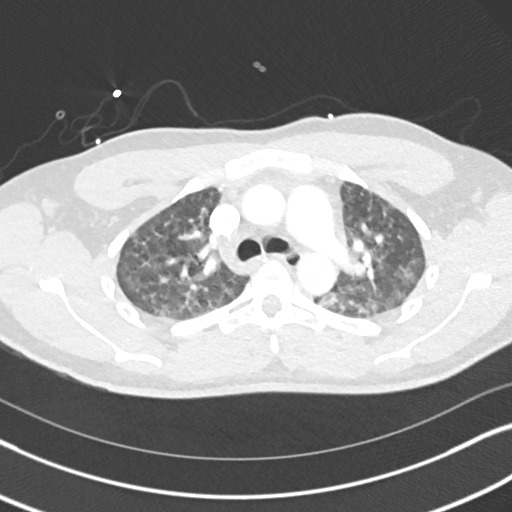
[im 221/340  mediastinal]
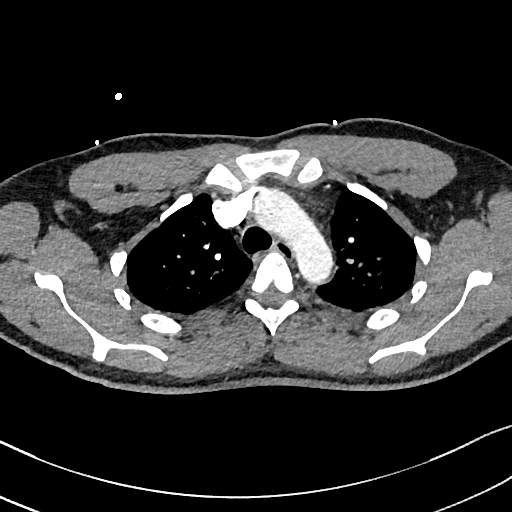
[im 238/340  lung]
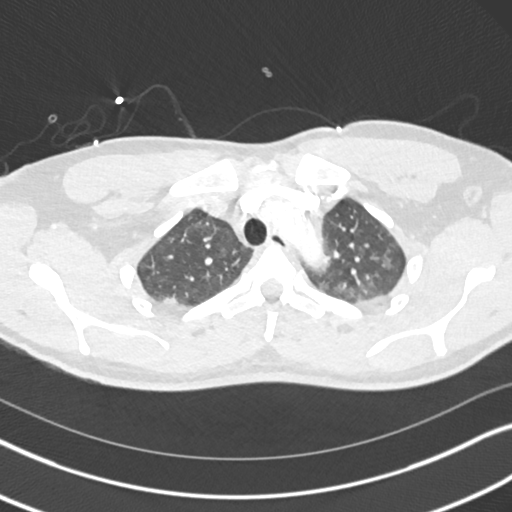
[im 272/340  mediastinal]
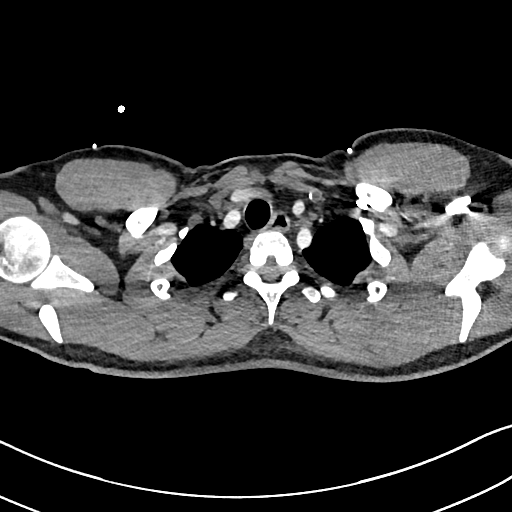
[im 289/340  lung]
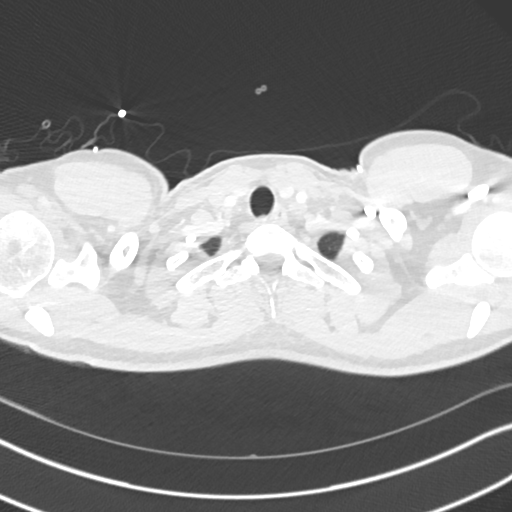
[im 306/340  mediastinal]
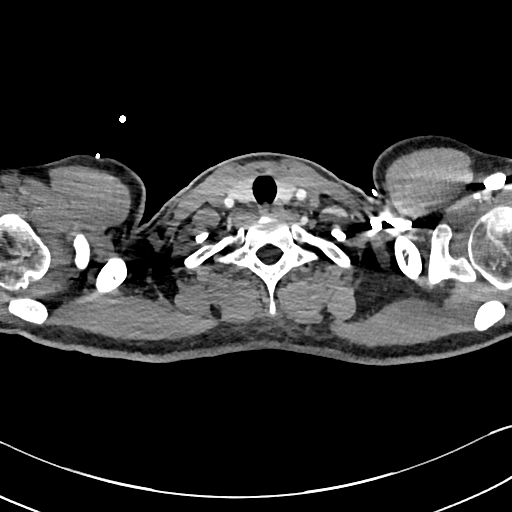
[im 323/340  lung]
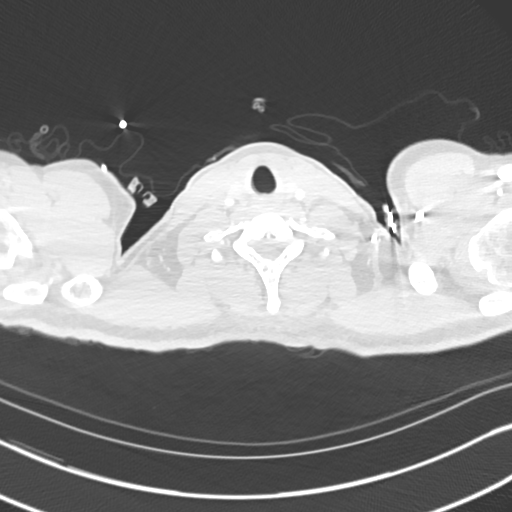

[Series 9: pe 2mm cor · coronal · 0.49mm/px · 1 of 124 slices shown]
[im 62/124  mediastinal]
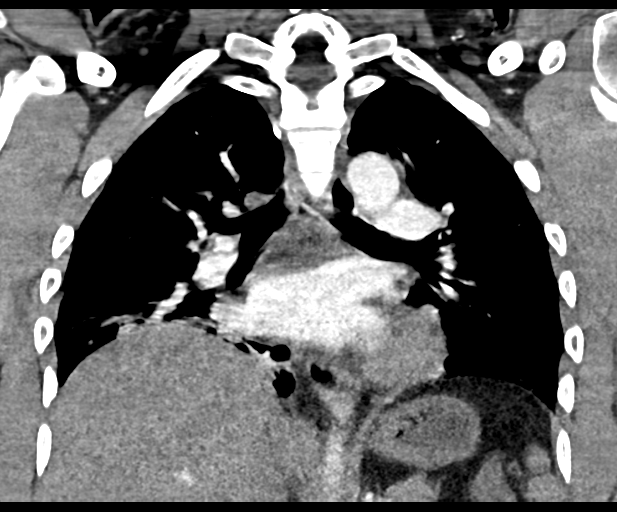

[18 of 36 positions shown; findings below may reference images not displayed]

RADIATION DOSE REDUCTION: This exam was performed according to the
departmental dose-optimization program which includes automated
exposure control, adjustment of the mA and/or kV according to
patient size and/or use of iterative reconstruction technique.

CONTRAST:  50mL OMNIPAQUE IOHEXOL 350 MG/ML SOLN
FINDINGS: Cardiovascular: Heart is enlarged in size. Thoracic aorta shows no
aneurysmal dilatation or dissection. Pulmonary artery shows a normal
branching pattern without definitive filling defect to suggest
pulmonary embolism. Opacification in the right lower lobe
particularly is somewhat limited due to adjacent infiltrate.

Mediastinum/Nodes: Thoracic inlet is within normal limits. No hilar
or mediastinal adenopathy is noted. The esophagus is within normal
limits.

Lungs/Pleura: Lungs are well aerated bilaterally with the exception
of the lower lobes with patchy infiltrate right slightly greater
than left identified consistent with early infiltrate. Patchy
ground-glass opacities are noted throughout the remaining lungs
which may represent postinflammatory change is well. These have
increased in the interval from the prior exam.

Upper Abdomen: Visualized upper abdomen is unremarkable.

Musculoskeletal: No acute bony abnormality is noted.

Review of the MIP images confirms the above findings.
IMPRESSION: No evidence of pulmonary emboli.

Bibasilar infiltrates right greater than left consistent with
developing pneumonia.

Increase in generalized ground-glass opacities throughout both lungs
as previously described. Correlation with laboratory values is
recommended as COVID pneumonia could present in this fashion.

## 2024-01-16 ENCOUNTER — Other Ambulatory Visit: Payer: Self-pay | Admitting: Medical Genetics

## 2024-01-22 ENCOUNTER — Other Ambulatory Visit: Payer: Self-pay | Admitting: Orthopedic Surgery

## 2024-01-23 ENCOUNTER — Ambulatory Visit: Admitting: Orthopedic Surgery

## 2024-01-23 ENCOUNTER — Encounter: Payer: Self-pay | Admitting: Orthopedic Surgery

## 2024-01-23 VITALS — BP 110/80 | HR 74 | Temp 98.1°F | Ht 71.0 in | Wt 183.4 lb

## 2024-01-23 DIAGNOSIS — Z21 Asymptomatic human immunodeficiency virus [HIV] infection status: Secondary | ICD-10-CM | POA: Diagnosis not present

## 2024-01-23 DIAGNOSIS — M87052 Idiopathic aseptic necrosis of left femur: Secondary | ICD-10-CM | POA: Diagnosis not present

## 2024-01-23 DIAGNOSIS — Z01818 Encounter for other preprocedural examination: Secondary | ICD-10-CM

## 2024-01-23 NOTE — Progress Notes (Unsigned)
 Careteam: Patient Care Team: Gil Greig BRAVO, NP as PCP - General (Adult Health Nurse Practitioner)  Seen by: Greig Gil, AGNP-C  PLACE OF SERVICE:  [facility]  Advanced Directive information    Allergies[1]  Chief Complaint  Patient presents with    Sugical clearance     HPI: Patient is a 33 y.o. male seen today for preoperative medical clearance.   Discussed the use of AI scribe software for clinical note transcription with the patient, who gave verbal consent to proceed.  History of Present Illness   Stephen Lee is a 33 year old male who presents for preoperative evaluation for left total hip replacement.  He is scheduled for a left total hip replacement 12/22 due to avascular necrosis. S/p RATH 05/17/2023. The left hip has been increasingly painful, reaching a point of significant discomfort daily when ambulating. Initially, the right hip was more painful, leading to its earlier surgical intervention. He is not taking anything for pain.   He is currently taking Biktarvy  once daily for HIV management and has an upcoming appointment with infectious disease specialists. No chest pain, shortness of breath, dizziness, or severe headaches. No issues with urination.  He does not smoke.   Last A1c was slightly elevated. Admits to some weight fluctuations this year. A1c was 6.0 04/18/2023.     EKG NSR 04/18/2023.     Review of Systems:  Review of Systems  Constitutional: Negative.   HENT: Negative.    Eyes: Negative.   Respiratory: Negative.    Cardiovascular: Negative.   Gastrointestinal: Negative.   Genitourinary: Negative.   Musculoskeletal:  Positive for joint pain. Negative for falls.  Skin: Negative.   Neurological: Negative.   Endo/Heme/Allergies: Negative.   Psychiatric/Behavioral: Negative.      Past Medical History:  Diagnosis Date   Attention deficit    Condyloma acuminatum in male    perianal & anal canal   HIV (human immunodeficiency virus  infection) (HCC)    Past Surgical History:  Procedure Laterality Date   TOTAL HIP ARTHROPLASTY Right 05/17/2023   Procedure: ARTHROPLASTY, HIP, TOTAL, ANTERIOR APPROACH;  Surgeon: Liam Lerner, MD;  Location: [facility];  Service: Orthopedics;  Laterality: Right;  RIGHT TOTAL ANTEIROR HIP ARTHROPLASTY   Social History:   reports that he has never smoked. He has never used smokeless tobacco. He reports that he does not currently use alcohol. He reports that he does not use drugs.  History reviewed. No pertinent family history.  Medications: Patient's Medications  New Prescriptions   No medications on file  Previous Medications   ASPIRIN  EC 81 MG TABLET    Take 1 tablet (81 mg total) by mouth 2 (two) times daily.   BIKTARVY  50-200-25 MG TABS TABLET    Take 1 tablet by mouth daily.  Modified Medications   No medications on file  Discontinued Medications   No medications on file    Physical Exam:  Vitals:   01/23/24 1510  BP: 110/80  Pulse: 74  Temp: 98.1 F (36.7 C)  SpO2: 95%  Weight: 183 lb 6.4 oz (83.2 kg)  Height: 5' 11 (1.803 m)   Body mass index is 25.58 kg/m. Wt Readings from Last 3 Encounters:  01/23/24 183 lb 6.4 oz (83.2 kg)  05/17/23 182 lb 15.7 oz (83 kg)  05/13/23 183 lb (83 kg)    Physical Exam Vitals reviewed.  Constitutional:      General: He is not in acute distress. HENT:  Head: Normocephalic.  Eyes:     General:        Right eye: No discharge.        Left eye: No discharge.  Cardiovascular:     Rate and Rhythm: Normal rate and regular rhythm.     Pulses: Normal pulses.     Heart sounds: Normal heart sounds.  Pulmonary:     Effort: Pulmonary effort is normal.     Breath sounds: Normal breath sounds.  Abdominal:     General: Abdomen is flat.     Palpations: Abdomen is soft.  Musculoskeletal:     Cervical back: Neck supple.     Right lower leg: No edema.     Left lower leg: No edema.  Skin:    General: Skin is warm.     Capillary  Refill: Capillary refill takes less than 2 seconds.  Neurological:     General: No focal deficit present.     Mental Status: He is alert and oriented to person, place, and time.  Psychiatric:        Mood and Affect: Mood normal.     Labs reviewed: Basic Metabolic Panel: Recent Labs    04/18/23 1541  NA 139  K 4.1  CL 103  CO2 29  GLUCOSE 94  BUN 12  CREATININE 1.05  CALCIUM 9.9   Liver Function Tests: Recent Labs    04/18/23 1541  AST 18  ALT 7*  BILITOT 0.4  PROT 8.3*   No results for input(s): LIPASE, AMYLASE in the last 8760 hours. No results for input(s): AMMONIA in the last 8760 hours. CBC: Recent Labs    04/18/23 1541  WBC 6.7  NEUTROABS 2,740  HGB 15.1  HCT 44.7  MCV 88.0  PLT 300   Lipid Panel: No results for input(s): CHOL, HDL, LDLCALC, TRIG, CHOLHDL, LDLDIRECT in the last 8760 hours. TSH: No results for input(s): TSH in the last 8760 hours. A1C: Lab Results  Component Value Date   HGBA1C 6.0 (H) 04/18/2023     Assessment/Plan 1. Avascular necrosis of bone of hip, left (HCC) (Primary) - scheduled left hip arthroplasty 12/22> Dr. Yvone  - not on pain medication - bp at goal - does not smoke - A1c improved to 5.6  - medically cleared for surgery> low risk   2. Preoperative clearance - Hemoglobin A1c> 5.6> was 6.0 (04/2023) - CBC with Differential/Platelet> unremarkable - Complete Metabolic Panel with eGFR> unremarkable - vitals stable - EKG NSR 04/18/2023  3. Asymptomatic HIV infection (HCC) - followed by I/D - cont Biktarvy   Total time: 31 minutes. Greater than 50% of total time spent doing patient education regarding left hip pain, HIV and postoperative pain/recovery including symptom/medication management.     Next appt: 04/30/2024  Greig Gil BODILY  [facility] [phone]      [1] No Known Allergies

## 2024-01-23 NOTE — Progress Notes (Signed)
 Sent message, via epic in basket, requesting orders in epic from Careers adviser.

## 2024-01-23 NOTE — Patient Instructions (Signed)
 SURGICAL WAITING ROOM VISITATION  Patients having surgery or a procedure may have no more than 2 support people in the waiting area - these visitors may rotate.    Children ages 56 and under will not be able to visit patients in [facility] under most circumstances.   Visitors with respiratory illnesses are discouraged from visiting and should remain at home.  If the patient needs to stay at the hospital during part of their recovery, the visitor guidelines for inpatient rooms apply. Pre-op nurse will coordinate an appropriate time for 1 support person to accompany patient in pre-op.  This support person may not rotate.    Please refer to the [facility] website for the visitor guidelines for Inpatients (after your surgery is over and you are in a regular room).       Your procedure is scheduled on:   02/03/2024    Report to [facility] Main Entrance    Report to admitting at   1115 am    Call this number if you have problems the morning of surgery [phone]   Do not eat food :After Midnight.   After Midnight you may have the following liquids until _  1045_____ AM DAY OF SURGERY  Water  Non-Citrus Juices (without pulp, NO RED-Apple, White grape, White cranberry) Black Coffee (NO MILK/CREAM OR CREAMERS, sugar ok)  Clear Tea (NO MILK/CREAM OR CREAMERS, sugar ok) regular and decaf                             Plain Jell-O (NO RED)                                           Fruit ices (not with fruit pulp, NO RED)                                     Popsicles (NO RED)                                                               Sports drinks like Gatorade (NO RED)                     The day of surgery:  Drink ONE (1) Pre-Surgery Clear Ensure or G2 at  1045 AM the morning of surgery. Drink in one sitting. Do not sip.  This drink was given to you during your hospital  pre-op appointment visit. Nothing else to drink after completing the  Pre-Surgery Clear  Ensure or G2.          If you have questions, please contact your surgeons office.       Oral Hygiene is also important to reduce your risk of infection.                                    Remember - BRUSH YOUR TEETH THE MORNING OF SURGERY WITH YOUR REGULAR TOOTHPASTE  DENTURES WILL BE REMOVED PRIOR TO  SURGERY PLEASE DO NOT APPLY Poly grip OR ADHESIVES!!!   Do NOT smoke after Midnight   Stop all vitamins and herbal supplements 7 days before surgery.   Take these medicines the morning of surgery with A SIP OF WATER :  Biktarvy    DO NOT TAKE ANY ORAL DIABETIC MEDICATIONS DAY OF YOUR SURGERY  Bring CPAP mask and tubing day of surgery.                              You may not have any metal on your body including hair pins, jewelry, and body piercing             Do not wear make-up, lotions, powders, perfumes/cologne, or deodorant  Do not wear nail polish including gel and S&S, artificial/acrylic nails, or any other type of covering on natural nails including finger and toenails. If you have artificial nails, gel coating, etc. that needs to be removed by a nail salon please have this removed prior to surgery or surgery may need to be canceled/ delayed if the surgeon/ anesthesia feels like they are unable to be safely monitored.   Do not shave  48 hours prior to surgery.               Men may shave face and neck.   Do not bring valuables to the hospital.  IS NOT             RESPONSIBLE   FOR VALUABLES.   Contacts, glasses, dentures or bridgework may not be worn into surgery.   Bring small overnight bag day of surgery.   DO NOT BRING YOUR HOME MEDICATIONS TO THE HOSPITAL. PHARMACY WILL DISPENSE MEDICATIONS LISTED ON YOUR MEDICATION LIST TO YOU DURING YOUR ADMISSION IN THE HOSPITAL!    Patients discharged on the day of surgery will not be allowed to drive home.  Someone NEEDS to stay with you for the first 24 hours after anesthesia.   Special Instructions: Bring a  copy of your healthcare power of attorney and living will documents the day of surgery if you haven't scanned them before.              Please read over the following fact sheets you were given: IF YOU HAVE QUESTIONS ABOUT YOUR PRE-OP INSTRUCTIONS PLEASE CALL [phone].   If you received a COVID test during your pre-op visit  it is requested that you wear a mask when out in public, stay away from anyone that may not be feeling well and notify your surgeon if you develop symptoms. If you test positive for Covid or have been in contact with anyone that has tested positive in the last 10 days please notify you surgeon.      Pre-operative 4 CHG Bath Instructions   You can play a key role in reducing the risk of infection after surgery. Your skin needs to be as free of germs as possible. You can reduce the number of germs on your skin by washing with CHG (chlorhexidine  gluconate) soap before surgery. CHG is an antiseptic soap that kills germs and continues to kill germs even after washing.   DO NOT use if you have an allergy to chlorhexidine /CHG or antibacterial soaps. If your skin becomes reddened or irritated, stop using the CHG and notify one of our RNs at [phone].   Please shower with the CHG soap starting 4 days before surgery using the following schedule:  Please keep in mind the following:  DO NOT shave, including legs and underarms, starting the day of your first shower.   You may shave your face at any point before/day of surgery.  Place clean sheets on your bed the day you start using CHG soap. Use a clean washcloth (not used since being washed) for each shower. DO NOT sleep with pets once you start using the CHG.   CHG Shower Instructions:  If you choose to wash your hair and private area, wash first with your normal shampoo/soap.  After you use shampoo/soap, rinse your hair and body thoroughly to remove shampoo/soap residue.  Turn the water  OFF and apply about 3 tablespoons  (45 ml) of CHG soap to a CLEAN washcloth.  Apply CHG soap ONLY FROM YOUR NECK DOWN TO YOUR TOES (washing for 3-5 minutes)  DO NOT use CHG soap on face, private areas, open wounds, or sores.  Pay special attention to the area where your surgery is being performed.  If you are having back surgery, having someone wash your back for you may be helpful. Wait 2 minutes after CHG soap is applied, then you may rinse off the CHG soap.  Pat dry with a clean towel  Put on clean clothes/pajamas   If you choose to wear lotion, please use ONLY the CHG-compatible lotions on the back of this paper.     Additional instructions for the day of surgery: DO NOT APPLY any lotions, deodorants, cologne, or perfumes.   Put on clean/comfortable clothes.  Brush your teeth.  Ask your nurse before applying any prescription medications to the skin.      CHG Compatible Lotions   Aveeno Moisturizing lotion  Cetaphil Moisturizing Cream  Cetaphil Moisturizing Lotion  Clairol Herbal Essence Moisturizing Lotion, Dry Skin  Clairol Herbal Essence Moisturizing Lotion, Extra Dry Skin  Clairol Herbal Essence Moisturizing Lotion, Normal Skin  Curel Age Defying Therapeutic Moisturizing Lotion with Alpha Hydroxy  Curel Extreme Care Body Lotion  Curel Soothing Hands Moisturizing Hand Lotion  Curel Therapeutic Moisturizing Cream, Fragrance-Free  Curel Therapeutic Moisturizing Lotion, Fragrance-Free  Curel Therapeutic Moisturizing Lotion, Original Formula  Eucerin Daily Replenishing Lotion  Eucerin Dry Skin Therapy Plus Alpha Hydroxy Crme  Eucerin Dry Skin Therapy Plus Alpha Hydroxy Lotion  Eucerin Original Crme  Eucerin Original Lotion  Eucerin Plus Crme Eucerin Plus Lotion  Eucerin TriLipid Replenishing Lotion  Keri Anti-Bacterial Hand Lotion  Keri Deep Conditioning Original Lotion Dry Skin Formula Softly Scented  Keri Deep Conditioning Original Lotion, Fragrance Free Sensitive Skin Formula  Keri Lotion Fast  Absorbing Fragrance Free Sensitive Skin Formula  Keri Lotion Fast Absorbing Softly Scented Dry Skin Formula  Keri Original Lotion  Keri Skin Renewal Lotion Keri Silky Smooth Lotion  Keri Silky Smooth Sensitive Skin Lotion  Nivea Body Creamy Conditioning Oil  Nivea Body Extra Enriched Teacher, Adult Education Moisturizing Lotion Nivea Crme  Nivea Skin Firming Lotion  NutraDerm 30 Skin Lotion  NutraDerm Skin Lotion  NutraDerm Therapeutic Skin Cream  NutraDerm Therapeutic Skin Lotion  ProShield Protective Hand Cream  Provon moisturizing lotion

## 2024-01-23 NOTE — Progress Notes (Signed)
 Anesthesia Review:  PCP: Greig Cluster, NP ov on 01/23/24  preop for clearance  Cardiologist : none   PPM/ ICD: Device Orders: Rep Notified:  Chest x-ray : EKG : 04/18/23  Echo : Stress test: Cardiac Cath :   Activity level: can do a flight of stairs without difficutly  Sleep Study/ CPAP : none  Fasting Blood Sugar :      / Checks Blood Sugar -- times a day:    Blood Thinner/ Instructions /Last Dose: ASA / Instructions/ Last Dose :    81 mg aspirn    05/17/23- right hip    CBC and cmp and hgba1c done on 01/23/24 by PCp .   On 01/27/24 pt thought appt was at 0845 instead of 0800.  Med hx and preop instructons completed via phone call.  PT came by to pick up copy of instructions and hibiclens  soap and pcr swab completed.     ORders rerequested on 01/27/2024.

## 2024-01-24 ENCOUNTER — Ambulatory Visit: Payer: Self-pay | Admitting: Orthopedic Surgery

## 2024-01-24 LAB — COMPLETE METABOLIC PANEL WITHOUT GFR
AG Ratio: 1.4 (calc) (ref 1.0–2.5)
ALT: 13 U/L (ref 9–46)
AST: 25 U/L (ref 10–40)
Albumin: 4.6 g/dL (ref 3.6–5.1)
Alkaline phosphatase (APISO): 84 U/L (ref 36–130)
BUN: 11 mg/dL (ref 7–25)
CO2: 27 mmol/L (ref 20–32)
Calcium: 9.3 mg/dL (ref 8.6–10.3)
Chloride: 104 mmol/L (ref 98–110)
Creat: 1.08 mg/dL (ref 0.60–1.26)
Globulin: 3.2 g/dL (ref 1.9–3.7)
Glucose, Bld: 91 mg/dL (ref 65–99)
Potassium: 4.1 mmol/L (ref 3.5–5.3)
Sodium: 139 mmol/L (ref 135–146)
Total Bilirubin: 0.3 mg/dL (ref 0.2–1.2)
Total Protein: 7.8 g/dL (ref 6.1–8.1)

## 2024-01-24 LAB — CBC WITH DIFFERENTIAL/PLATELET
Absolute Lymphocytes: 2975 {cells}/uL (ref 850–3900)
Absolute Monocytes: 609 {cells}/uL (ref 200–950)
Basophils Absolute: 28 {cells}/uL (ref 0–200)
Basophils Relative: 0.4 %
Eosinophils Absolute: 182 {cells}/uL (ref 15–500)
Eosinophils Relative: 2.6 %
HCT: 46.9 % (ref 39.4–51.1)
Hemoglobin: 15.6 g/dL (ref 13.2–17.1)
MCH: 29.7 pg (ref 27.0–33.0)
MCHC: 33.3 g/dL (ref 31.6–35.4)
MCV: 89.2 fL (ref 81.4–101.7)
MPV: 10.8 fL (ref 7.5–12.5)
Monocytes Relative: 8.7 %
Neutro Abs: 3206 {cells}/uL (ref 1500–7800)
Neutrophils Relative %: 45.8 %
Platelets: 289 Thousand/uL (ref 140–400)
RBC: 5.26 Million/uL (ref 4.20–5.80)
RDW: 13.2 % (ref 11.0–15.0)
Total Lymphocyte: 42.5 %
WBC: 7 Thousand/uL (ref 3.8–10.8)

## 2024-01-24 LAB — HEMOGLOBIN A1C
Hgb A1c MFr Bld: 5.6 % (ref <5.7)
Mean Plasma Glucose: 114 mg/dL
eAG (mmol/L): 6.3 mmol/L

## 2024-01-27 ENCOUNTER — Encounter (HOSPITAL_COMMUNITY): Admission: RE | Admit: 2024-01-27 | Discharge: 2024-01-27 | Attending: Orthopedic Surgery

## 2024-01-27 ENCOUNTER — Other Ambulatory Visit: Payer: Self-pay

## 2024-01-27 ENCOUNTER — Encounter (HOSPITAL_COMMUNITY): Payer: Self-pay

## 2024-01-27 DIAGNOSIS — Z01812 Encounter for preprocedural laboratory examination: Secondary | ICD-10-CM | POA: Diagnosis present

## 2024-01-27 DIAGNOSIS — Z01818 Encounter for other preprocedural examination: Secondary | ICD-10-CM

## 2024-01-27 LAB — SURGICAL PCR SCREEN
MRSA, PCR: NEGATIVE
Staphylococcus aureus: NEGATIVE

## 2024-01-27 NOTE — Progress Notes (Addendum)
 Need orders in epic.  Surgery on 02/03/2024.  Thank You.

## 2024-02-01 NOTE — Anesthesia Preprocedure Evaluation (Signed)
"                                    Anesthesia Evaluation  Patient identified by MRN, date of birth, ID band Patient awake    Reviewed: Allergy & Precautions, NPO status , Patient's Chart, lab work & pertinent test results  Airway Mallampati: III  TM Distance: >3 FB Neck ROM: Full    Dental  (+) Teeth Intact, Dental Advisory Given   Pulmonary neg pulmonary ROS   Pulmonary exam normal breath sounds clear to auscultation       Cardiovascular negative cardio ROS Normal cardiovascular exam Rhythm:Regular Rate:Normal     Neuro/Psych negative neurological ROS  negative psych ROS   GI/Hepatic negative GI ROS, Neg liver ROS,,,  Endo/Other  negative endocrine ROS    Renal/GU negative Renal ROS  negative genitourinary   Musculoskeletal L hip AVN   Abdominal   Peds  Hematology  (+) HIVHb 15.6, plt 289 Last HIV labs 11/2022: Viral load undetectable, CD4 count 474   Anesthesia Other Findings   Reproductive/Obstetrics negative OB ROS                              Anesthesia Physical Anesthesia Plan  ASA: 3  Anesthesia Plan: Spinal and MAC   Post-op Pain Management: Tylenol  PO (pre-op)*   Induction:   PONV Risk Score and Plan: 2 and Propofol  infusion and TIVA  Airway Management Planned: Natural Airway and Nasal Cannula  Additional Equipment: None  Intra-op Plan:   Post-operative Plan:   Informed Consent: I have reviewed the patients History and Physical, chart, labs and discussed the procedure including the risks, benefits and alternatives for the proposed anesthesia with the patient or authorized representative who has indicated his/her understanding and acceptance.       Plan Discussed with: CRNA  Anesthesia Plan Comments:          Anesthesia Quick Evaluation  "

## 2024-02-02 NOTE — H&P (Signed)
 TOTAL HIP ADMISSION H&P  Patient is admitted for left total hip arthroplasty.  Subjective:  Chief Complaint: left hip pain  HPI: Stephen Lee, 33 y.o. male, has a history of pain and functional disability in the left hip(s) due to AVN and patient has failed non-surgical conservative treatments for greater than 12 weeks to include NSAID's and/or analgesics, supervised PT with diminished ADL's post treatment, and activity modification.  Onset of symptoms was gradual starting Several years ago with gradually worsening course since that time.The patient noted no past surgery on the left hip(s).  Patient currently rates pain in the left hip at 10 out of 10 with activity. Patient has night pain, worsening of pain with activity and weight bearing, trendelenberg gait, pain that interfers with activities of daily living, and pain with passive range of motion. Patient has evidence of AVN by imaging studies. This condition presents safety issues increasing the risk of falls.  There is no current active infection.  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   HIV (human immunodeficiency virus infection) (HCC) 07/19/2021   Condyloma acuminatum in male 10/12/2010   Past Medical History:  Diagnosis Date   Attention deficit    Condyloma acuminatum in male    perianal & anal canal   HIV (human immunodeficiency virus infection) (HCC)     Past Surgical History:  Procedure Laterality Date   TOTAL HIP ARTHROPLASTY Right 05/17/2023   Procedure: ARTHROPLASTY, HIP, TOTAL, ANTERIOR APPROACH;  Surgeon: Liam Lerner, MD;   Location: [facility];  Service: Orthopedics;  Laterality: Right;  RIGHT TOTAL ANTEIROR HIP ARTHROPLASTY    No current facility-administered medications for this encounter.   Current Outpatient Medications  Medication Sig Dispense Refill Last Dose/Taking   BIKTARVY  50-200-25 MG TABS tablet Take 1 tablet by mouth daily.   Taking   Allergies[1]  Social History   Tobacco Use   Smoking status: Never   Smokeless tobacco: Never  Substance Use Topics   Alcohol use: Never    No family history on file.   Review of Systems  Constitutional: Negative.   HENT: Negative.    Eyes: Negative.   Respiratory: Negative.    Cardiovascular: Negative.   Gastrointestinal: Negative.   Endocrine: Negative.   Genitourinary: Negative.   Musculoskeletal:  Positive for arthralgias.  Skin: Negative.   Allergic/Immunologic:       HIV  Neurological: Negative.   Hematological: Negative.   Psychiatric/Behavioral: Negative.      Objective:  Physical Exam Constitutional:      Appearance: Normal appearance. He is normal weight.  HENT:     Head: Normocephalic and atraumatic.     Nose: Nose normal.  Eyes:     Pupils: Pupils are equal, round, and reactive to light.  Cardiovascular:     Pulses: Normal pulses.  Pulmonary:     Effort: Pulmonary effort is normal.  Musculoskeletal:        General: Tenderness present.     Cervical back: Normal range of motion and neck supple.     Comments: Pt has severe pain with attempts of internal rotation  Skin:    General:  Skin is warm and dry.  Neurological:     General: No focal deficit present.     Mental Status: He is alert and oriented to person, place, and time. Mental status is at baseline.  Psychiatric:        Mood and Affect: Mood normal.        Behavior: Behavior normal.        Thought Content: Thought content normal.        Judgment: Judgment normal.     Vital signs in last 24 hours:    Labs:   Estimated body mass index is 25.58 kg/m as  calculated from the following:   Height as of 01/23/24: 5' 11 (1.803 m).   Weight as of 01/23/24: 83.2 kg.   Imaging Review Plain radiographs demonstrate AVN with collapse of femoral head.      Assessment/Plan:  End stage arthritis, left hip(s)  The patient history, physical examination, clinical judgement of the provider and imaging studies are consistent with end stage degenerative joint disease of the left hip(s) and total hip arthroplasty is deemed medically necessary. The treatment options including medical management, injection therapy, arthroscopy and arthroplasty were discussed at length. The risks and benefits of total hip arthroplasty were presented and reviewed. The risks due to aseptic loosening, infection, stiffness, dislocation/subluxation,  thromboembolic complications and other imponderables were discussed.  The patient acknowledged the explanation, agreed to proceed with the plan and consent was signed. Patient is being admitted for inpatient treatment for surgery, pain control, PT, OT, prophylactic antibiotics, VTE prophylaxis, progressive ambulation and ADL's and discharge planning.The patient is planning to be discharged home with home health services    Patient's anticipated LOS is less than 2 midnights, meeting these requirements: - Younger than 57 - Lives within 1 hour of care - Has a competent adult at home to recover with post-op recover - NO history of  - Chronic pain requiring opiods  - Diabetes  - Coronary Artery Disease  - Heart failure  - Heart attack  - Stroke  - DVT/VTE  - Cardiac arrhythmia  - Respiratory Failure/COPD  - Renal failure  - Anemia  - Advanced Liver disease      [1] No Known Allergies

## 2024-02-03 ENCOUNTER — Encounter: Admission: RE | Payer: Self-pay

## 2024-02-03 ENCOUNTER — Ambulatory Visit (HOSPITAL_COMMUNITY): Payer: Self-pay | Admitting: Anesthesiology

## 2024-02-03 ENCOUNTER — Encounter (HOSPITAL_COMMUNITY): Payer: Self-pay | Admitting: Orthopedic Surgery

## 2024-02-03 ENCOUNTER — Ambulatory Visit (HOSPITAL_COMMUNITY)
Admission: RE | Admit: 2024-02-03 | Discharge: 2024-02-03 | Disposition: A | Discharge status: Home / Self Care | Source: Ambulatory Visit | Attending: Orthopedic Surgery | Admitting: Orthopedic Surgery

## 2024-02-03 ENCOUNTER — Ambulatory Visit (HOSPITAL_COMMUNITY)

## 2024-02-03 ENCOUNTER — Encounter (HOSPITAL_COMMUNITY): Payer: Self-pay | Admitting: Medical

## 2024-02-03 DIAGNOSIS — Z21 Asymptomatic human immunodeficiency virus [HIV] infection status: Secondary | ICD-10-CM | POA: Diagnosis not present

## 2024-02-03 DIAGNOSIS — M87052 Idiopathic aseptic necrosis of left femur: Secondary | ICD-10-CM

## 2024-02-03 DIAGNOSIS — M87059 Idiopathic aseptic necrosis of unspecified femur: Secondary | ICD-10-CM | POA: Diagnosis present

## 2024-02-03 DIAGNOSIS — M8788 Other osteonecrosis, other site: Secondary | ICD-10-CM | POA: Diagnosis present

## 2024-02-03 DIAGNOSIS — M87852 Other osteonecrosis, left femur: Secondary | ICD-10-CM | POA: Insufficient documentation

## 2024-02-03 DIAGNOSIS — E876 Hypokalemia: Secondary | ICD-10-CM | POA: Diagnosis not present

## 2024-02-03 DIAGNOSIS — Z01818 Encounter for other preprocedural examination: Secondary | ICD-10-CM

## 2024-02-03 DIAGNOSIS — M1612 Unilateral primary osteoarthritis, left hip: Secondary | ICD-10-CM | POA: Diagnosis not present

## 2024-02-03 HISTORY — PX: TOTAL HIP ARTHROPLASTY: SHX124

## 2024-02-03 LAB — TYPE AND SCREEN
ABO/RH(D): B POS
Antibody Screen: NEGATIVE

## 2024-02-03 SURGERY — ARTHROPLASTY, HIP, TOTAL, ANTERIOR APPROACH
Anesthesia: Monitor Anesthesia Care | Site: Hip | Laterality: Left

## 2024-02-03 MED ORDER — PROPOFOL 10 MG/ML IV BOLUS
INTRAVENOUS | Status: DC | PRN
  Administered 2024-02-03: 50 mg via INTRAVENOUS

## 2024-02-03 MED ORDER — MEPERIDINE HCL 25 MG/ML IJ SOLN: 6.2500 mg | INTRAMUSCULAR | Status: DC | PRN

## 2024-02-03 MED ORDER — ACETAMINOPHEN 500 MG PO TABS
1000.0000 mg | ORAL_TABLET | Freq: Once | ORAL | Status: AC
  Administered 2024-02-03: 1000 mg via ORAL
  Filled 2024-02-03: qty 2

## 2024-02-03 MED ORDER — KETOROLAC TROMETHAMINE 30 MG/ML IJ SOLN
INTRAMUSCULAR | Status: AC
  Filled 2024-02-03: qty 1

## 2024-02-03 MED ORDER — METHOCARBAMOL 1000 MG/10ML IJ SOLN: 500.0000 mg | Freq: Four times a day (QID) | INTRAMUSCULAR | Status: DC | PRN

## 2024-02-03 MED ORDER — HYDROMORPHONE HCL 1 MG/ML IJ SOLN: 0.2500 mg | INTRAMUSCULAR | Status: DC | PRN

## 2024-02-03 MED ORDER — TRANEXAMIC ACID-NACL 1000-0.7 MG/100ML-% IV SOLN
1000.0000 mg | Freq: Once | INTRAVENOUS | Status: AC
  Administered 2024-02-03: 1000 mg via INTRAVENOUS

## 2024-02-03 MED ORDER — CEFAZOLIN SODIUM-DEXTROSE 2-4 GM/100ML-% IV SOLN
2.0000 g | INTRAVENOUS | Status: AC
  Administered 2024-02-03: 2 g via INTRAVENOUS
  Filled 2024-02-03: qty 100

## 2024-02-03 MED ORDER — TRANEXAMIC ACID-NACL 1000-0.7 MG/100ML-% IV SOLN
INTRAVENOUS | Status: AC
  Filled 2024-02-03: qty 100

## 2024-02-03 MED ORDER — CEFAZOLIN SODIUM-DEXTROSE 2-4 GM/100ML-% IV SOLN
INTRAVENOUS | Status: AC
  Filled 2024-02-03: qty 100

## 2024-02-03 MED ORDER — ONDANSETRON HCL 4 MG/2ML IJ SOLN
INTRAMUSCULAR | Status: AC
  Filled 2024-02-03: qty 2

## 2024-02-03 MED ORDER — ASPIRIN 325 MG PO TBEC: 325.0000 mg | DELAYED_RELEASE_TABLET | Freq: Two times a day (BID) | ORAL | 0 refills | Status: AC

## 2024-02-03 MED ORDER — FENTANYL CITRATE (PF) 100 MCG/2ML IJ SOLN
INTRAMUSCULAR | Status: AC
  Filled 2024-02-03: qty 2

## 2024-02-03 MED ORDER — CHLORHEXIDINE GLUCONATE 0.12 % MT SOLN
15.0000 mL | Freq: Once | OROMUCOSAL | Status: AC
  Administered 2024-02-03: 15 mL via OROMUCOSAL

## 2024-02-03 MED ORDER — CEFAZOLIN SODIUM-DEXTROSE 2-4 GM/100ML-% IV SOLN
2.0000 g | Freq: Four times a day (QID) | INTRAVENOUS | Status: AC
  Administered 2024-02-03: 2 g via INTRAVENOUS

## 2024-02-03 MED ORDER — MIDAZOLAM HCL 2 MG/2ML IJ SOLN
INTRAMUSCULAR | Status: AC
  Filled 2024-02-03: qty 2

## 2024-02-03 MED ORDER — OXYCODONE HCL 5 MG PO TABS: 5.0000 mg | ORAL_TABLET | Freq: Once | ORAL | Status: DC | PRN

## 2024-02-03 MED ORDER — ORAL CARE MOUTH RINSE: 15.0000 mL | Freq: Once | OROMUCOSAL | Status: AC

## 2024-02-03 MED ORDER — METHOCARBAMOL 500 MG PO TABS: 500.0000 mg | ORAL_TABLET | Freq: Four times a day (QID) | ORAL | Status: DC | PRN

## 2024-02-03 MED ORDER — TRANEXAMIC ACID-NACL 1000-0.7 MG/100ML-% IV SOLN
1000.0000 mg | INTRAVENOUS | Status: AC
  Administered 2024-02-03: 1000 mg via INTRAVENOUS
  Filled 2024-02-03: qty 100

## 2024-02-03 MED ORDER — MIDAZOLAM HCL 5 MG/5ML IJ SOLN
INTRAMUSCULAR | Status: DC | PRN
  Administered 2024-02-03: 2 mg via INTRAVENOUS

## 2024-02-03 MED ORDER — SODIUM CHLORIDE 0.9 % IR SOLN
Status: DC | PRN
  Administered 2024-02-03: 1000 mL

## 2024-02-03 MED ORDER — POVIDONE-IODINE 10 % EX SWAB: 2.0000 | Freq: Once | CUTANEOUS | Status: DC

## 2024-02-03 MED ORDER — OXYCODONE HCL 5 MG/5ML PO SOLN: 5.0000 mg | Freq: Once | ORAL | Status: DC | PRN

## 2024-02-03 MED ORDER — CELECOXIB 200 MG PO CAPS: 200.0000 mg | ORAL_CAPSULE | Freq: Two times a day (BID) | ORAL | 0 refills | Status: AC

## 2024-02-03 MED ORDER — HYDROMORPHONE HCL 1 MG/ML IJ SOLN
0.5000 mg | INTRAMUSCULAR | Status: DC | PRN
  Administered 2024-02-03: 1 mg via INTRAVENOUS

## 2024-02-03 MED ORDER — GLYCOPYRROLATE 0.2 MG/ML IJ SOLN
INTRAMUSCULAR | Status: DC | PRN
  Administered 2024-02-03: .2 mg via INTRAVENOUS

## 2024-02-03 MED ORDER — ONDANSETRON HCL 4 MG/2ML IJ SOLN
INTRAMUSCULAR | Status: DC | PRN
  Administered 2024-02-03: 4 mg via INTRAVENOUS

## 2024-02-03 MED ORDER — PHENYLEPHRINE HCL-NACL 20-0.9 MG/250ML-% IV SOLN
INTRAVENOUS | Status: DC | PRN
  Administered 2024-02-03: 30 ug/min via INTRAVENOUS

## 2024-02-03 MED ORDER — TIZANIDINE HCL 2 MG PO TABS: 2.0000 mg | ORAL_TABLET | Freq: Three times a day (TID) | ORAL | 0 refills | Status: AC | PRN

## 2024-02-03 MED ORDER — FENTANYL CITRATE (PF) 100 MCG/2ML IJ SOLN
INTRAMUSCULAR | Status: DC | PRN
  Administered 2024-02-03 (×2): 50 ug via INTRAVENOUS

## 2024-02-03 MED ORDER — LACTATED RINGERS IV BOLUS: 250.0000 mL | Freq: Once | INTRAVENOUS | Status: DC

## 2024-02-03 MED ORDER — OXYCODONE-ACETAMINOPHEN 5-325 MG PO TABS: 1.0000 | ORAL_TABLET | Freq: Four times a day (QID) | ORAL | 0 refills | Status: AC | PRN

## 2024-02-03 MED ORDER — BUPIVACAINE-EPINEPHRINE (PF) 0.25% -1:200000 IJ SOLN
INTRAMUSCULAR | Status: DC | PRN
  Administered 2024-02-03: 40 mL

## 2024-02-03 MED ORDER — LACTATED RINGERS IV BOLUS: 500.0000 mL | Freq: Once | INTRAVENOUS | Status: DC

## 2024-02-03 MED ORDER — BUPIVACAINE LIPOSOME 1.3 % IJ SUSP
INTRAMUSCULAR | Status: AC
  Filled 2024-02-03: qty 10

## 2024-02-03 MED ORDER — BUPIVACAINE LIPOSOME 1.3 % IJ SUSP: 10.0000 mL | Freq: Once | INTRAMUSCULAR | Status: DC

## 2024-02-03 MED ORDER — DOCUSATE SODIUM 100 MG PO CAPS: 100.0000 mg | ORAL_CAPSULE | Freq: Two times a day (BID) | ORAL | 0 refills | Status: AC

## 2024-02-03 MED ORDER — PROPOFOL 500 MG/50ML IV EMUL
INTRAVENOUS | Status: DC | PRN
  Administered 2024-02-03: 20 mg via INTRAVENOUS
  Administered 2024-02-03: 40 ug/kg/min via INTRAVENOUS

## 2024-02-03 MED ORDER — SUCCINYLCHOLINE CHLORIDE 200 MG/10ML IV SOSY
PREFILLED_SYRINGE | INTRAVENOUS | Status: DC | PRN
  Administered 2024-02-03: 140 mg via INTRAVENOUS

## 2024-02-03 MED ORDER — MEPIVACAINE HCL (PF) 2 % IJ SOLN
INTRAMUSCULAR | Status: DC | PRN
  Administered 2024-02-03: 3.2 mL via INTRATHECAL

## 2024-02-03 MED ORDER — LACTATED RINGERS IV SOLN: INTRAVENOUS | Status: DC

## 2024-02-03 MED ORDER — LACTATED RINGERS IV BOLUS
250.0000 mL | Freq: Once | INTRAVENOUS | Status: AC
  Administered 2024-02-03: 250 mL via INTRAVENOUS

## 2024-02-03 MED ORDER — KETOROLAC TROMETHAMINE 30 MG/ML IJ SOLN
30.0000 mg | Freq: Once | INTRAMUSCULAR | Status: AC | PRN
  Administered 2024-02-03: 30 mg via INTRAVENOUS

## 2024-02-03 MED ORDER — HYDROMORPHONE HCL 1 MG/ML IJ SOLN
INTRAMUSCULAR | Status: AC
  Filled 2024-02-03: qty 1

## 2024-02-03 MED ORDER — BUPIVACAINE-EPINEPHRINE (PF) 0.25% -1:200000 IJ SOLN
INTRAMUSCULAR | Status: AC
  Filled 2024-02-03: qty 30

## 2024-02-03 MED ORDER — DEXAMETHASONE SOD PHOSPHATE PF 10 MG/ML IJ SOLN
INTRAMUSCULAR | Status: DC | PRN
  Administered 2024-02-03: 10 mg via INTRAVENOUS

## 2024-02-03 MED ORDER — AMISULPRIDE (ANTIEMETIC) 5 MG/2ML IV SOLN: 10.0000 mg | Freq: Once | INTRAVENOUS | Status: DC | PRN

## 2024-02-03 MED ADMIN — Water For Irrigation, Sterile Irrigation Soln: 2000 mL | NDC 00338000344

## 2024-02-03 SURGICAL SUPPLY — 43 items
BAG COUNTER SPONGE SURGICOUNT (BAG) IMPLANT
BAG ZIPLOCK 12X15 (MISCELLANEOUS) IMPLANT
BENZOIN TINCTURE PRP APPL 2/3 (GAUZE/BANDAGES/DRESSINGS) IMPLANT
BLADE SAW SGTL 18X1.27X75 (BLADE) ×1 IMPLANT
COVER PERINEAL POST (MISCELLANEOUS) ×1 IMPLANT
COVER SURGICAL LIGHT HANDLE (MISCELLANEOUS) ×1 IMPLANT
CUP ACETBLR 54 OD 100 SERIES (Hips) IMPLANT
DRAPE FOOT SWITCH (DRAPES) ×1 IMPLANT
DRAPE STERI IOBAN 125X83 (DRAPES) ×1 IMPLANT
DRAPE U-SHAPE 47X51 STRL (DRAPES) ×2 IMPLANT
DRSG AQUACEL AG ADV 3.5X 6 (GAUZE/BANDAGES/DRESSINGS) ×1 IMPLANT
DRSG AQUACEL AG ADV 3.5X10 (GAUZE/BANDAGES/DRESSINGS) IMPLANT
DURAPREP 26ML APPLICATOR (WOUND CARE) ×1 IMPLANT
ELECT PENCIL ROCKER SW 15FT (MISCELLANEOUS) ×1 IMPLANT
ELECT REM PT RETURN 15FT ADLT (MISCELLANEOUS) ×1 IMPLANT
ELIMINATOR HOLE APEX DEPUY (Hips) IMPLANT
FEMORAL STEM 12/14 TPR SZ4 HIP (Orthopedic Implant) IMPLANT
GAUZE XEROFORM 1X8 LF (GAUZE/BANDAGES/DRESSINGS) IMPLANT
GLOVE BIOGEL PI IND STRL 8 (GLOVE) ×2 IMPLANT
GLOVE BIOGEL PI IND STRL 9 (GLOVE) IMPLANT
GLOVE ECLIPSE 7.5 STRL STRAW (GLOVE) ×2 IMPLANT
GLOVE ECLIPSE 8.5 STRL (GLOVE) IMPLANT
GOWN STRL REUS W/ TWL XL LVL3 (GOWN DISPOSABLE) ×2 IMPLANT
HEAD FEM CER ARTIC -2 36 12/14 (Head) IMPLANT
HOLDER FOLEY CATH W/STRAP (MISCELLANEOUS) ×1 IMPLANT
HOOD PEEL AWAY T7 (MISCELLANEOUS) ×3 IMPLANT
KIT TURNOVER KIT A (KITS) ×1 IMPLANT
LINER NEUTRAL 36ID 54OD (Liner) IMPLANT
NDL HYPO 22X1.5 SAFETY MO (MISCELLANEOUS) ×2 IMPLANT
NEEDLE HYPO 22X1.5 SAFETY MO (MISCELLANEOUS) ×2 IMPLANT
PACK ANTERIOR HIP CUSTOM (KITS) ×1 IMPLANT
SPIKE FLUID TRANSFER (MISCELLANEOUS) ×1 IMPLANT
STAPLER SKIN PROX 35W (STAPLE) IMPLANT
STRIP CLOSURE SKIN 1/2X4 (GAUZE/BANDAGES/DRESSINGS) IMPLANT
SUT ETHIBOND NAB CT1 #1 30IN (SUTURE) ×2 IMPLANT
SUT MNCRL AB 3-0 PS2 18 (SUTURE) IMPLANT
SUT VIC AB 0 CT1 36 (SUTURE) ×1 IMPLANT
SUT VIC AB 1 CT1 36 (SUTURE) ×1 IMPLANT
SUT VIC AB 2-0 CT1 TAPERPNT 27 (SUTURE) ×1 IMPLANT
SUT VICRYL+ 3-0 36IN CT-1 (SUTURE) IMPLANT
TRAY CATH INTERMITTENT SS 16FR (CATHETERS) IMPLANT
TRAY FOLEY MTR SLVR 16FR STAT (SET/KITS/TRAYS/PACK) IMPLANT
TUBE SUCTION HIGH CAP CLEAR NV (SUCTIONS) ×1 IMPLANT

## 2024-02-03 NOTE — Op Note (Signed)
 PATIENT ID:      Stephen Lee  MRN:     [id number] DOB/AGE:    1990-11-05 / 33 y.o.       OPERATIVE REPORT    DATE OF PROCEDURE:  02/03/2024       PREOPERATIVE DIAGNOSIS:  LEFT HIP AVASCULAR NECROSIS                                                       Estimated body mass index is 25.52 kg/m as calculated from the following:   Height as of this encounter: 5' 11 (1.803 m).   Weight as of this encounter: 83 kg.     POSTOPERATIVE DIAGNOSIS:  LEFT HIP AVASCULAR NECROSIS                                                           PROCEDURE:  1.  Left total hip arthroplasty using a 54 mm DePuy GRIPTION Cup, [employer],  neutral liner, a  -2 36 mm ceramic head,  and a # 4 high offset Actis stem, 2.interpretation of multiple intraoperative fluoroscopic images   SURGEON: Norleen [name]    ASSISTANT:   Signe Reining PA-C  (present throughout entire procedure and necessary for timely completion of the procedure)  ANESTHESIA: spinal with a subsequent general BLOOD LOSS: 300 cc Tranexamic Acid : 1 gram IV DRAINS: None COMPLICATIONS: None    NDICATIONS FOR PROCEDURE:Patient with end-stage arthritis of the left hip.  X-rays show bone-on-bone arthritic changes. Despite conservative measures with observation, anti-inflammatory medicine, narcotics, use of a cane, has severe unremitting pain and can ambulate only less than 1 block before resting.  Patient desires elective left total hip arthroplasty to decrease pain and increase function. The risks, benefits, and alternatives were discussed at length including but not limited to the risks of infection, bleeding, nerve injury, stiffness, blood clots, the need for revision surgery, cardiopulmonary complications, among others, and they were willing to proceed.Benefits have been discussed. Questions answered.     PROCEDURE IN DETAIL: The patient was identified by armband,  received preoperative IV antibiotics in the holding area at [facility], taken to the operating room , appropriate anesthetic monitors  were attached and spinal anesthesia was induced.  The patient was placed onto the hot bed and all bony prominences were well-padded.The left hip was prepped and draped for an anterior approach to the hip.  An incision was made and the subcutaneous dissection was down to the level of the tensor fascia.  The fascia was opened and finger dissected.  The bleeders coming across the anterior portion of the hip were identified and cauterized. Retractors were put in place above and below the femoral neck.  The capsule was opened and tagged and a provisional neck cut was made.  The head was removed and sized on the back table.  The acetabulum was sequentially reamed to a level of 53 mm and a 54mm porous-coated pinnacle cup was hammered into place with 45 of lateral opening and 30 of anteversion.fluoroscopy was used to ensure this position of the cup.  Attention was turned towards the femur  where the leg was actually rotated, extended, and adduction did.  The femur was sequentially broached until a size of 4 broach gave a perfect fit and fill.at this point a -2 mm delta ceramic hip ball was placed and the hip reduced.  Fluoroscopic images were taken to assess the leg length, fit and fill of the stem, and cup position.  We were happy with the construct at this point.  The 4 broach was removed and a final Actis stem with high offset  and a -2mm ceramic hip ball was placed and reduced.  Final images were taken to make certain there were happy with the position at this point.   The capsule was closed with #1 Vicryl suture.  The tensor fascia was closed with 0 Vicryl suture.  The skin was then closed with combination of 0 and 2-0 Vicryl suture.  The top layer was with 3-0 Monocryl suture.  Benzoin and Steri-Strips were applied  and a sterile compressive dressing was applied and the patient taken to recovery room she noted be in satisfactory  condition.  Past medical Motion for the procedure was approximately 300 cc.  Of note Signe Reining was present the entire case and assisted by retraction of tissues, manipulation of the leg, and closing the minimize or time.   Norleen [name] 02/03/2024, 3:49 PM

## 2024-02-03 NOTE — Anesthesia Procedure Notes (Signed)
 Spinal  Patient location during procedure: OR Start time: 02/03/2024 10:53 AM End time: 02/03/2024 10:56 AM Reason for block: surgical anesthesia  Staffing Performed: anesthesiologist  Authorized by: Merla Almarie HERO, DO   Performed by: Merla Almarie HERO, DO  Preanesthetic Checklist Completed: patient identified, IV checked, risks and benefits discussed, surgical consent, monitors and equipment checked, pre-op evaluation and timeout performed Spinal Block Patient position: sitting Prep: DuraPrep and site prepped and draped Patient monitoring: cardiac monitor, continuous pulse ox and blood pressure Approach: midline Location: L3-4 Injection technique: single-shot Needle Needle type: Pencan  Needle gauge: 24 G Needle length: 9 cm Assessment Sensory level: T6 Events: CSF return  Additional Notes Functioning IV was confirmed and monitors were applied. Sterile prep and drape, including hand hygiene and sterile gloves were used. The patient was positioned and the spine was prepped. The skin was anesthetized with lidocaine .  Free flow of clear CSF was obtained prior to injecting local anesthetic into the CSF.  The spinal needle aspirated freely following injection.  The needle was carefully withdrawn.  The patient tolerated the procedure well.

## 2024-02-03 NOTE — Discharge Instructions (Signed)

## 2024-02-03 NOTE — Anesthesia Procedure Notes (Signed)
 Procedure Name: Intubation Date/Time: 02/03/2024 11:23 AM  Performed by: Carleton Garnette SAUNDERS, CRNAPre-anesthesia Checklist: Patient identified, Emergency Drugs available, Suction available and Patient being monitored Patient Re-evaluated:Patient Re-evaluated prior to induction Oxygen Delivery Method: Circle system utilized Preoxygenation: Pre-oxygenation with 100% oxygen Induction Type: IV induction Ventilation: Mask ventilation with difficulty Laryngoscope Size: Mac and 4 Grade View: Grade I Tube type: Oral Tube size: 7.5 mm Number of attempts: 1 Airway Equipment and Method: Stylet Placement Confirmation: ETT inserted through vocal cords under direct vision and positive ETCO2 Secured at: 23 cm Tube secured with: Tape Dental Injury: Teeth and Oropharynx as per pre-operative assessment

## 2024-02-03 NOTE — Evaluation (Signed)
 " Physical Therapy Evaluation Patient Details Name: Story Vanvranken MRN: [id number] DOB: 05-30-1990 Today's Date: 02/03/2024  History of Present Illness  33 yo male presents to therapy s/p L THA, anterior approach on 02/03/1024 due to failure of conservative measures and avascular necrosis. Pt is currently L LE WBAT and no hip precautions. Pt PMH includes but is not limited to: syphilis, AIDS, hypokalemia, ADD, and R THA (05/17/2023).  Clinical Impression    Jaystin Mcgarvey is a 33 y.o. male POD 0 s/p L THA. Patient reports IND with mobility at baseline. Patient is now limited by functional impairments (see PT problem list below) and requires CGA for transfers and gait with RW. Patient was able to ambulate 60 feet x 2 with RW and CGA to close S and cues for safe walker management. Patient educated on safe sequencing for stair mobility with use of Rw to navigate curb, fall risk prevention, use of RW and cp/ice, pain management and goal, slowly increasing activity level and car transfers pt and significant other verbalized understanding of  safe guarding position for people assisting with mobility. Patient instructed in exercises to facilitate ROM and circulation. Patient will benefit from continued skilled PT interventions to address impairments and progress towards PLOF. Patient has met mobility goals at adequate level for discharge home with family and social support with [facility] services; will continue to follow if pt continues acute stay to progress towards Mod I goals.       If plan is discharge home, recommend the following: A little help with walking and/or transfers;A little help with bathing/dressing/bathroom;Assist for transportation;Help with stairs or ramp for entrance   Can travel by private vehicle        Equipment Recommendations Rolling walker (2 wheels)  Recommendations for Other Services       Functional Status Assessment Patient has had a recent decline in their functional status and  demonstrates the ability to make significant improvements in function in a reasonable and predictable amount of time.     Precautions / Restrictions Precautions Precautions: Fall Restrictions Weight Bearing Restrictions Per Provider Order: No      Mobility  Bed Mobility Overal bed mobility: Needs Assistance Bed Mobility: Supine to Sit     Supine to sit: Supervision     General bed mobility comments: min cues    Transfers Overall transfer level: Needs assistance Equipment used: Rolling walker (2 wheels) Transfers: Sit to/from Stand Sit to Stand: Contact guard assist           General transfer comment: min cues    Ambulation/Gait Ambulation/Gait assistance: Contact guard assist Gait Distance (Feet): 60 Feet Assistive device: Rolling walker (2 wheels) Gait Pattern/deviations: Step-to pattern, Decreased step length - left, Antalgic, Trunk flexed Gait velocity: decreased     General Gait Details: slight trunk flexion with B UE support at RW to offload L LE in stance phase, min cues for Rw management  Stairs Stairs: Yes Stairs assistance: Contact guard assist Stair Management: Two rails Number of Stairs: 2 General stair comments: min cues for safety and sequencing and step to pattern with pt ed provided on use of RW to navigate curb to access apartment and pt verbalized understanding  Wheelchair Mobility     Tilt Bed    Modified Rankin (Stroke Patients Only)       Balance Overall balance assessment: Needs assistance Sitting-balance support: Feet supported Sitting balance-Leahy Scale: Good     Standing balance support: Bilateral upper extremity supported, During functional activity, [place]  on assistive device for balance Standing balance-Leahy Scale: Fair Standing balance comment: static standing no UE support                             Pertinent Vitals/Pain Pain Assessment Pain Assessment: 0-10 Pain Score: 7  Pain Location: L hip,  LE and knee Pain Descriptors / Indicators: Aching, Constant, Discomfort, Operative site guarding Pain Intervention(s): Limited activity within patient's tolerance, Monitored during session, Repositioned, Premedicated before session, Ice applied    Home Living Family/patient expects to be discharged to:: Private residence Living Arrangements: Spouse/significant other Available Help at Discharge: Family Type of Home: Apartment Home Access: Stairs to enter Entrance Stairs-Rails: None Entrance Stairs-Number of Steps: one curb   Home Layout: One level Home Equipment: Cane - single point      Prior Function Prior Level of Function : Independent/Modified Independent;Driving;Working/employed             Mobility Comments: SPC for mobility in community, no AD in home furniture walks, IND with all ADLs, self care tasks and IADls       Extremity/Trunk Assessment        Lower Extremity Assessment Lower Extremity Assessment: LLE deficits/detail LLE Deficits / Details: ankle DF/PF 5/5 LLE Sensation: WNL    Cervical / Trunk Assessment Cervical / Trunk Assessment: Normal  Communication   Communication Communication: No apparent difficulties    Cognition Arousal: Alert Behavior During Therapy: WFL for tasks assessed/performed   PT - Cognitive impairments: No apparent impairments                         Following commands: Intact       Cueing       General Comments      Exercises Total Joint Exercises Ankle Circles/Pumps: AROM, Both, 10 reps Quad Sets: AROM, Left, 5 reps Heel Slides: AROM, Left, 5 reps Hip ABduction/ADduction: AROM, Left, 5 reps, Standing Long Arc Quad: AROM, Left, 5 reps, Seated Knee Flexion: AROM, Left, 5 reps, Standing Marching in Standing: AROM, Left, 5 reps, Standing Standing Hip Extension: AROM, Left, 5 reps, Standing   Assessment/Plan    PT Assessment Patient needs continued PT services  PT Problem List Decreased  strength;Decreased range of motion;Decreased activity tolerance;Decreased balance;Decreased mobility;Pain       PT Treatment Interventions DME instruction;Gait training;Stair training;Functional mobility training;Therapeutic activities;Therapeutic exercise;Balance training;Neuromuscular re-education;Patient/family education;Modalities    PT Goals (Current goals can be found in the Care Plan section)  Acute Rehab PT Goals Patient Stated Goal: same old, same old, diffrent day no pain PT Goal Formulation: With patient Time For Goal Achievement: 02/17/24 Potential to Achieve Goals: Good    Frequency 7X/week     Co-evaluation               AM-PAC PT 6 Clicks Mobility  Outcome Measure Help needed turning from your back to your side while in a flat bed without using bedrails?: None Help needed moving from lying on your back to sitting on the side of a flat bed without using bedrails?: A Little Help needed moving to and from a bed to a chair (including a wheelchair)?: A Little Help needed standing up from a chair using your arms (e.g., wheelchair or bedside chair)?: A Little Help needed to walk in hospital room?: A Little Help needed climbing 3-5 steps with a railing? : A Little 6 Click Score: 19    End of  Session Equipment Utilized During Treatment: Gait belt Activity Tolerance: Patient tolerated treatment well;No increased pain Patient left: in chair;with call bell/phone within reach;with family/visitor present Nurse Communication: Mobility status;Other (comment) (pt readiness for same day d/c from PT standpoint) PT Visit Diagnosis: Unsteadiness on feet (R26.81);Other abnormalities of gait and mobility (R26.89);Muscle weakness (generalized) (M62.81);Difficulty in walking, not elsewhere classified (R26.2);Pain Pain - Right/Left: Left Pain - part of body: Hip;Leg;Knee    Time: 8444-8366 PT Time Calculation (min) (ACUTE ONLY): 38 min   Charges:   PT Evaluation $PT Eval Low  Complexity: 1 Low PT Treatments $Gait Training: 8-22 mins $Therapeutic Exercise: 8-22 mins PT General Charges $$ ACUTE PT VISIT: 1 Visit         [place], PT Acute Rehab   [place] VEAR Drone 02/03/2024, 7:36 PM "

## 2024-02-03 NOTE — Interval H&P Note (Signed)
 History and Physical Interval Note:  02/03/2024 9:55 AM  Stephen Lee  has presented today for surgery, with the diagnosis of LEFT HIP AVASCULAR NECROSIS.  The various methods of treatment have been discussed with the patient and family. After consideration of risks, benefits and other options for treatment, the patient has consented to  Procedures: ARTHROPLASTY, HIP, TOTAL, ANTERIOR APPROACH (Left) as a surgical intervention.  The patient's history has been reviewed, patient examined, no change in status, stable for surgery.  I have reviewed the patient's chart and labs.  Questions were answered to the patient's satisfaction.     Norleen [name]

## 2024-02-03 NOTE — Transfer of Care (Signed)
 Immediate Anesthesia Transfer of Care Note  Patient: Stephen Lee  Procedure(s) Performed: ARTHROPLASTY, HIP, TOTAL, ANTERIOR APPROACH (Left: Hip)  Patient Location: PACU  Anesthesia Type:General  Level of Consciousness: sedated  Airway & Oxygen Therapy: Patient Spontanous Breathing and Patient connected to face mask oxygen  Post-op Assessment: Report given to RN and Post -op Vital signs reviewed and stable  Post vital signs: Reviewed and stable  Last Vitals:  Vitals Value Taken Time  BP 104/74 02/03/24 13:01  Temp    Pulse 96 02/03/24 13:02  Resp 16 02/03/24 13:02  SpO2 95 % 02/03/24 13:02  Vitals shown include unfiled device data.  Last Pain:  Vitals:   02/03/24 0959  TempSrc: Oral  PainSc: 0-No pain         Complications: No notable events documented.

## 2024-02-04 ENCOUNTER — Encounter (HOSPITAL_COMMUNITY): Payer: Self-pay | Admitting: Orthopedic Surgery

## 2024-02-04 NOTE — Anesthesia Postprocedure Evaluation (Addendum)
"   Anesthesia Post Note  Patient: Stephen Lee  Procedure(s) Performed: ARTHROPLASTY, HIP, TOTAL, ANTERIOR APPROACH (Left: Hip)     Patient location during evaluation: PACU Anesthesia Type: MAC Level of consciousness: awake and alert, oriented and patient cooperative Pain management: pain level controlled Vital Signs Assessment: post-procedure vital signs reviewed and stable Respiratory status: spontaneous breathing, nonlabored ventilation and respiratory function stable Cardiovascular status: blood pressure returned to baseline and stable Postop Assessment: no apparent nausea or vomiting Anesthetic complications: no Comments: Spinal converted to general for laryngospasm mid-case, no issues.   No notable events documented.  Last Vitals:  Vitals:   02/03/24 1445 02/03/24 1545  BP: (!) 137/99 (!) 122/92  Pulse: 86 85  Resp: 12   Temp:    SpO2: 98% 97%    Last Pain:  Vitals:   02/03/24 1545  TempSrc:   PainSc: 3                  Almarie HERO Roxan Yamamoto      "

## 2024-02-10 ENCOUNTER — Other Ambulatory Visit: Payer: Self-pay

## 2024-02-10 ENCOUNTER — Ambulatory Visit: Attending: Orthopedic Surgery

## 2024-02-10 DIAGNOSIS — R262 Difficulty in walking, not elsewhere classified: Secondary | ICD-10-CM | POA: Diagnosis present

## 2024-02-10 DIAGNOSIS — M6281 Muscle weakness (generalized): Secondary | ICD-10-CM | POA: Insufficient documentation

## 2024-02-10 DIAGNOSIS — M25552 Pain in left hip: Secondary | ICD-10-CM | POA: Diagnosis present

## 2024-02-10 NOTE — Therapy (Signed)
 " OUTPATIENT PHYSICAL THERAPY LOWER EXTREMITY EVALUATION   Patient Name: Stephen Lee MRN: [id number] DOB:1990-05-07, 33 y.o., male Today's Date: 02/10/2024  END OF SESSION:  PT End of Session - 02/10/24 2040     Visit Number 1    Number of Visits 24    Date for Recertification  05/10/24    Authorization Type [place] MEDICAID [employer] [place]    PT Start Time 1445    PT Stop Time 1530    PT Time Calculation (min) 45 min    Activity Tolerance Patient tolerated treatment well          Past Medical History:  Diagnosis Date   Attention deficit    Condyloma acuminatum in male    perianal & anal canal   HIV (human immunodeficiency virus infection) (HCC)    Past Surgical History:  Procedure Laterality Date   TOTAL HIP ARTHROPLASTY Right 05/17/2023   Procedure: ARTHROPLASTY, HIP, TOTAL, ANTERIOR APPROACH;  Surgeon: Liam Lerner, MD;  Location: [facility];  Service: Orthopedics;  Laterality: Right;  RIGHT TOTAL ANTEIROR HIP ARTHROPLASTY   TOTAL HIP ARTHROPLASTY Left 02/03/2024   Procedure: ARTHROPLASTY, HIP, TOTAL, ANTERIOR APPROACH;  Surgeon: Yvone Rush, MD;  Location: [facility];  Service: Orthopedics;  Laterality: Left;   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 Pain is gone in hip, its moreso soreness as of right now.   PERTINENT HISTORY: HIV, previous R THA in April 2025   PAIN:  4C, 7W Are you having pain? Yes: NPRS scale: 10 Pain location: L hip Pain description: soreness/achiness Aggravating factors: moving too far Relieving factors: rest  PRECAUTIONS: None  RED FLAGS: None   WEIGHT BEARING RESTRICTIONS: No  FALLS:  Has patient fallen in last 6 months? No  OCCUPATION: estate manager/land agent (desk job)  PLOF: Independent  PATIENT GOALS: decrease pain, back to walking  OBJECTIVE:  Note: Objective measures were completed at Evaluation unless otherwise noted.   PATIENT SURVEYS:  LEFS  Extreme difficulty/unable (0), Quite a bit of difficulty (1), Moderate difficulty (2), Little difficulty (3), No difficulty (4) Survey date:    Any of your usual work, housework or school activities   2. Usual hobbies, recreational or sporting activities   3. Getting into/out of the bath   4. Walking between rooms   5. Putting on socks/shoes   6. Squatting    7. Lifting an object, like a bag of groceries from the floor   8. Performing light activities around your home   9. Performing heavy activities around your home  10. Getting into/out of a car   11. Walking 2 blocks   12. Walking 1 mile   13. Going up/down 10 stairs (1 flight)   14. Standing for 1 hour   15.  sitting for 1 hour   16. Running on even ground   17. Running on uneven ground   18. Making sharp turns while running fast   19. Hopping    20. Rolling over in bed   Score total:  38     COGNITION: Overall cognitive status: Within functional limits for tasks assessed     SENSATION: WFL   POSTURE: No Significant  postural limitations  PALPATION: L HIP  LOWER EXTREMITY ROM:  Active ROM Right eval Left eval  Hip flexion wfl Unable to raise  Hip extension    Hip abduction wfl wfl  Hip adduction wfl wfl  Hip internal rotation    Hip external rotation    Knee flexion    Knee extension    Ankle dorsiflexion    Ankle plantarflexion    Ankle inversion    Ankle eversion     (Blank rows = not tested)  LOWER EXTREMITY MMT:  MMT Right eval Left eval  Hip flexion 3+ <3  Hip extension    Hip abduction 3 <3  Hip adduction 3+ 3+  Hip internal rotation    Hip external rotation    Knee flexion 4+ <3  Knee extension 4+ 4-  Ankle dorsiflexion    Ankle plantarflexion    Ankle inversion    Ankle eversion     (Blank rows = not tested)    FUNCTIONAL TESTS:  5 times sit to stand: 27  GAIT: Distance walked: 50 Assistive device utilized: Environmental Consultant - 2 wheeled Level of assistance: Complete Independence Comments: slow gait with intoeing or LLE and reliance of weight on walker                                                                                                                                TREATMENT DATE:   TREATMENT 02/10/2024:    Neuromuscular re-ed: LAQ x6x3s Hip clamshell YTB x6x3s Supine hip partial marches x6x3s    Self-care/Home Management: Patient educated on HEP, POC, prognosis, and relevant tissues/anatomy.     PATIENT EDUCATION:  Education details: HEP Person educated: Patient Education method: Programmer, Multimedia, Facilities Manager, Actor cues, Verbal cues, and Handouts Education comprehension: verbalized understanding and returned demonstration  HOME EXERCISE PROGRAM: Access Code: [email or web address] URL: [email or web address] Date: 02/10/2024 Prepared by: Washington Scot  Exercises - Hooklying Clamshell with Resistance  - 2 x daily - 5 x weekly - 2 sets - 6 reps - 3 hold - Supine March  - 2 x daily - 5 x weekly - 2 sets - 6 reps - 3 hold - Seated Long Arc  Quad  - 2 x daily - 5 x weekly - 2 sets - 6 reps - 3 hold  ASSESSMENT:  CLINICAL  IMPRESSION: EVAL: Patient is a 33 year old male who presents with s/p L THA on 02/03/24. Patient presents with deficits in: L hip arom, strength, functional activity tolerance, excessive pain. As a result, the patient would benefit from skilled PT to address aforementioned deficits via plan below.   OBJECTIVE IMPAIRMENTS: difficulty walking, decreased ROM, decreased strength, improper body mechanics, and pain.   ACTIVITY LIMITATIONS: carrying, bending, sitting, standing, squatting, stairs, and locomotion level  PERSONAL FACTORS: Past/current experiences, Time since onset of injury/illness/exacerbation, and 1-2 comorbidities:   are also affecting patient's functional outcome.   REHAB POTENTIAL: Fair    CLINICAL DECISION MAKING: Evolving/moderate complexity  EVALUATION COMPLEXITY: Moderate   GOALS: Goals reviewed with patient? No  SHORT TERM GOALS: Target date: 03/02/2024   1) Patient will demonstrate 75% HEP compliance to show independence with self-management of condition   Baseline: 0% Goal status: INITIAL  2) Patient will decrease worst pain to 5 at most to improve ADL completion and overall QOL   Baseline: 7 Goal status: INITIAL    LONG TERM GOALS: Target date: 05/11/23   1) Patient will demonstrate 100% HEP compliance to show independence with self-management of condition   Baseline: 0% Goal status: INITIAL  2) Patient will decrease worst pain to 3 at most to improve ADL completion and overall QOL   Baseline: 7 Goal status: INITIAL  3) Patient will demonstrate a 10 point improvement in LEFS to show improvements in ADL completion and overall QOL    Baseline: 38 Goal status: INITIAL  4) Patient will demonstrate at least 4-/5 MMT score of L hip to show improvements in muscle strength needed for ADL completion.     Baseline: see chart Goal status: INITIAL  5) Patient  will have at least wfl ROM of L hip without significant increases in pain to demonstrate improved joint mobility needed for ADL completion    Baseline: see chart Goal status: INITIAL    PLAN:  PT FREQUENCY: 1-2x/week  PT DURATION: 12 weeks  PLANNED INTERVENTIONS: 97110-Therapeutic exercises, 97530- Therapeutic activity, 97112- Neuromuscular re-education, 97535- Self Care, [id number]- Manual therapy, [phone]- Gait training, Patient/Family education, Balance training, and Stair training  PLAN FOR NEXT SESSION: HEP assessment and progression, symptom modulation, and loading (isolated and/or functional). Manual therapy, aerobic, gait, and NME training as needed. Improve L hip strength and AROM    Washington Greener Latrese Carolan  PT, DPT  02/10/2024, 8:46 PM  "

## 2024-02-12 ENCOUNTER — Ambulatory Visit
Admission: EM | Admit: 2024-02-12 | Discharge: 2024-02-12 | Disposition: A | Discharge status: Home / Self Care | Source: Ambulatory Visit

## 2024-02-12 DIAGNOSIS — G44201 Tension-type headache, unspecified, intractable: Secondary | ICD-10-CM

## 2024-02-12 MED ORDER — CYCLOBENZAPRINE HCL 10 MG PO TABS: 10.0000 mg | ORAL_TABLET | Freq: Two times a day (BID) | ORAL | 0 refills | Status: AC | PRN

## 2024-02-12 MED ORDER — DICLOFENAC SODIUM 50 MG PO TBEC: 50.0000 mg | DELAYED_RELEASE_TABLET | Freq: Two times a day (BID) | ORAL | 1 refills | Status: AC

## 2024-02-12 NOTE — ED Triage Notes (Signed)
 Pt present with c/o headaches when waking up X 2 weeks. Pt states he drinks water  before bed.

## 2024-02-12 NOTE — Discharge Instructions (Signed)
" °  1. Acute intractable tension-type headache (Primary) - diclofenac (VOLTAREN) 50 MG EC tablet; Take 1 tablet (50 mg total) by mouth 2 (two) times daily.  Dispense: 30 tablet; Refill: 1 - cyclobenzaprine  (FLEXERIL ) 10 MG tablet; Take 1 tablet (10 mg total) by mouth 2 (two) times daily as needed for muscle spasms.  Dispense: 20 tablet; Refill: 0 - Ambulatory referral to Neurology for further evaluation and treatment if persistent symptoms despite current therapy  -Continue to monitor symptoms for any change in severity if there is any escalation of current symptoms or development of new symptoms follow-up in ER for further evaluation and management. "

## 2024-02-12 NOTE — ED Provider Notes (Signed)
 " [facility]  Note:  This document was prepared using Dragon voice recognition software and may include unintentional dictation errors.  MRN: [id number] DOB: May 07, 1990  Subjective:   Stephen Lee is a 33 y.o. male presenting for persistent headaches every morning after waking up for the last 2 weeks.  Patient reports that he has been drinking water  before bed, with no improvement.  Has not taken any over-the-counter pain medication to treat symptoms prior to arrival in urgent care.  Patient reports that he did have left hip surgery approximately 2 weeks ago prior to the onset of headache.  Patient reports he has not been taking his pain medication because they make him feel weird.  Patient was concerned that headaches may be due to his blood pressure or because he is not sleeping well due to pain from surgery.  Patient denies any head injury or trauma, no loss of consciousness or syncopal episode.  Current Medications[1]   Allergies[2]  Past Medical History:  Diagnosis Date   Attention deficit    Condyloma acuminatum in male    perianal & anal canal   HIV (human immunodeficiency virus infection) (HCC)      Past Surgical History:  Procedure Laterality Date   TOTAL HIP ARTHROPLASTY Right 05/17/2023   Procedure: ARTHROPLASTY, HIP, TOTAL, ANTERIOR APPROACH;  Surgeon: Liam Lerner, MD;  Location: [facility];  Service: Orthopedics;  Laterality: Right;  RIGHT TOTAL ANTEIROR HIP ARTHROPLASTY   TOTAL HIP ARTHROPLASTY Left 02/03/2024   Procedure: ARTHROPLASTY, HIP, TOTAL, ANTERIOR APPROACH;  Surgeon: Yvone Rush, MD;  Location: [facility];  Service: Orthopedics;  Laterality: Left;    History reviewed. No pertinent family history.  Social History[3]  ROS Refer to HPI for ROS details.  Objective:    Vitals: BP 112/81   Pulse 83   Temp 98.4 F (36.9 C)   Resp 18   SpO2 97%   Physical Exam Vitals and nursing note reviewed.  Constitutional:      General: He is not in acute  distress.    Appearance: Normal appearance. He is well-developed. He is not ill-appearing or toxic-appearing.  HENT:     Head: Normocephalic and atraumatic.     Nose: Nose normal. No congestion or rhinorrhea.     Mouth/Throat:     Mouth: Mucous membranes are moist.     Pharynx: Oropharynx is clear.  Eyes:     General:        Right eye: No discharge.        Left eye: No discharge.     Extraocular Movements: Extraocular movements intact.     Conjunctiva/sclera: Conjunctivae normal.     Pupils: Pupils are equal, round, and reactive to light.  Cardiovascular:     Rate and Rhythm: Normal rate.  Pulmonary:     Effort: Pulmonary effort is normal. No respiratory distress.     Breath sounds: No stridor. No wheezing.  Musculoskeletal:     Cervical back: Normal range of motion and neck supple. No rigidity or tenderness.  Skin:    General: Skin is warm and dry.  Neurological:     General: No focal deficit present.     Mental Status: He is alert and oriented to person, place, and time.  Psychiatric:        Mood and Affect: Mood normal.        Behavior: Behavior normal.     Procedures  No results found for this or any previous visit (from the past 24 hours).  Assessment and Plan :     Discharge Instructions       1. Acute intractable tension-type headache (Primary) - diclofenac (VOLTAREN) 50 MG EC tablet; Take 1 tablet (50 mg total) by mouth 2 (two) times daily.  Dispense: 30 tablet; Refill: 1 - cyclobenzaprine  (FLEXERIL ) 10 MG tablet; Take 1 tablet (10 mg total) by mouth 2 (two) times daily as needed for muscle spasms.  Dispense: 20 tablet; Refill: 0 - Ambulatory referral to Neurology for further evaluation and treatment if persistent symptoms despite current therapy  -Continue to monitor symptoms for any change in severity if there is any escalation of current symptoms or development of new symptoms follow-up in ER for further evaluation and management.       Abdou Stocks B  Abdikadir Fohl    [1] No current facility-administered medications for this encounter.  Current Outpatient Medications:    BIKTARVY  50-200-25 MG TABS tablet, Take 1 tablet by mouth daily., Disp: , Rfl:    cyclobenzaprine  (FLEXERIL ) 10 MG tablet, Take 1 tablet (10 mg total) by mouth 2 (two) times daily as needed for muscle spasms., Disp: 20 tablet, Rfl: 0   diclofenac (VOLTAREN) 50 MG EC tablet, Take 1 tablet (50 mg total) by mouth 2 (two) times daily., Disp: 30 tablet, Rfl: 1   aspirin  EC 325 MG tablet, Take 1 tablet (325 mg total) by mouth 2 (two) times daily after a meal. Take x 1 month post op to decrease risk of blood clots., Disp: 60 tablet, Rfl: 0   celecoxib  (CELEBREX ) 200 MG capsule, Take 1 capsule (200 mg total) by mouth 2 (two) times daily., Disp: 60 capsule, Rfl: 0   docusate sodium  (COLACE) 100 MG capsule, Take 1 capsule (100 mg total) by mouth 2 (two) times daily., Disp: 30 capsule, Rfl: 0   oxyCODONE -acetaminophen  (PERCOCET/ROXICET) 5-325 MG tablet, Take 1-2 tablets by mouth every 6 (six) hours as needed for severe pain (pain score 7-10)., Disp: 40 tablet, Rfl: 0   tiZANidine  (ZANAFLEX ) 2 MG tablet, Take 1 tablet (2 mg total) by mouth every 8 (eight) hours as needed for muscle spasms., Disp: 40 tablet, Rfl: 0 [2] No Known Allergies [3]  Social History Tobacco Use   Smoking status: Never   Smokeless tobacco: Never  Vaping Use   Vaping status: Never Used  Substance Use Topics   Alcohol use: Never   Drug use: No     Aurea Goodell B, NP 02/12/24 1303  "

## 2024-02-14 ENCOUNTER — Ambulatory Visit: Admitting: Family

## 2024-02-25 ENCOUNTER — Other Ambulatory Visit

## 2024-02-27 ENCOUNTER — Ambulatory Visit: Attending: Orthopedic Surgery

## 2024-02-27 ENCOUNTER — Telehealth: Payer: Self-pay

## 2024-02-27 NOTE — Telephone Encounter (Signed)
 Washington Odessia Scot  PT, DPT

## 2024-03-02 ENCOUNTER — Telehealth: Payer: Self-pay

## 2024-03-02 ENCOUNTER — Ambulatory Visit

## 2024-03-02 NOTE — Telephone Encounter (Signed)
 Stephen Lee  PT, DPT

## 2024-03-04 ENCOUNTER — Ambulatory Visit

## 2024-03-10 ENCOUNTER — Ambulatory Visit

## 2024-03-12 ENCOUNTER — Ambulatory Visit

## 2024-03-17 ENCOUNTER — Ambulatory Visit

## 2024-03-30 ENCOUNTER — Ambulatory Visit: Admitting: Family

## 2024-04-30 ENCOUNTER — Encounter: Admitting: Orthopedic Surgery
